# Patient Record
Sex: Female | Born: 1950 | Race: White | Hispanic: No | Marital: Married | Smoking: Never smoker
Health system: Southern US, Community
[De-identification: ages and names within clinical notes are randomized; demographics above are authoritative.]

## PROBLEM LIST (undated history)

## (undated) DIAGNOSIS — E785 Hyperlipidemia, unspecified: Secondary | ICD-10-CM

## (undated) DIAGNOSIS — E039 Hypothyroidism, unspecified: Secondary | ICD-10-CM

## (undated) DIAGNOSIS — M1990 Unspecified osteoarthritis, unspecified site: Secondary | ICD-10-CM

## (undated) DIAGNOSIS — Z87442 Personal history of urinary calculi: Secondary | ICD-10-CM

## (undated) DIAGNOSIS — J189 Pneumonia, unspecified organism: Secondary | ICD-10-CM

## (undated) DIAGNOSIS — Z974 Presence of external hearing-aid: Secondary | ICD-10-CM

## (undated) DIAGNOSIS — N200 Calculus of kidney: Secondary | ICD-10-CM

## (undated) DIAGNOSIS — Z8619 Personal history of other infectious and parasitic diseases: Secondary | ICD-10-CM

## (undated) DIAGNOSIS — J45909 Unspecified asthma, uncomplicated: Secondary | ICD-10-CM

## (undated) HISTORY — DX: Pneumonia, unspecified organism: J18.9

## (undated) HISTORY — DX: Presence of external hearing-aid: Z97.4

## (undated) HISTORY — PX: MOUTH SURGERY: SHX715

## (undated) HISTORY — PX: LITHOTRIPSY: SUR834

## (undated) HISTORY — DX: Hypothyroidism, unspecified: E03.9

## (undated) HISTORY — PX: WISDOM TOOTH EXTRACTION: SHX21

## (undated) HISTORY — DX: Calculus of kidney: N20.0

## (undated) HISTORY — PX: CYSTOSCOPY: SUR368

## (undated) HISTORY — DX: Hyperlipidemia, unspecified: E78.5

## (undated) HISTORY — DX: Unspecified osteoarthritis, unspecified site: M19.90

## (undated) HISTORY — PX: COLONOSCOPY: SHX174

## (undated) HISTORY — PX: INJECTION KNEE: SHX2446

## (undated) HISTORY — DX: Personal history of other infectious and parasitic diseases: Z86.19

## (undated) HISTORY — PX: TONSILLECTOMY AND ADENOIDECTOMY: SUR1326

---

## 1998-05-21 ENCOUNTER — Other Ambulatory Visit: Admission: RE | Admit: 1998-05-21 | Discharge: 1998-05-21 | Payer: Self-pay | Admitting: Obstetrics and Gynecology

## 1998-12-10 ENCOUNTER — Other Ambulatory Visit: Admission: RE | Admit: 1998-12-10 | Discharge: 1998-12-10 | Payer: Self-pay | Admitting: Obstetrics and Gynecology

## 1999-02-10 ENCOUNTER — Ambulatory Visit (HOSPITAL_COMMUNITY): Admission: RE | Admit: 1999-02-10 | Discharge: 1999-02-10 | Payer: Self-pay | Admitting: Urology

## 1999-02-10 ENCOUNTER — Encounter: Payer: Self-pay | Admitting: Urology

## 1999-12-09 ENCOUNTER — Ambulatory Visit (HOSPITAL_COMMUNITY): Admission: RE | Admit: 1999-12-09 | Discharge: 1999-12-09 | Payer: Self-pay | Admitting: Gastroenterology

## 1999-12-09 ENCOUNTER — Encounter (INDEPENDENT_AMBULATORY_CARE_PROVIDER_SITE_OTHER): Payer: Self-pay | Admitting: *Deleted

## 1999-12-27 ENCOUNTER — Other Ambulatory Visit: Admission: RE | Admit: 1999-12-27 | Discharge: 1999-12-27 | Payer: Self-pay | Admitting: Obstetrics and Gynecology

## 2000-07-02 ENCOUNTER — Encounter: Payer: Self-pay | Admitting: Urology

## 2000-07-04 ENCOUNTER — Encounter: Payer: Self-pay | Admitting: Urology

## 2000-07-04 ENCOUNTER — Ambulatory Visit (HOSPITAL_COMMUNITY): Admission: RE | Admit: 2000-07-04 | Discharge: 2000-07-04 | Payer: Self-pay | Admitting: Urology

## 2000-07-19 ENCOUNTER — Ambulatory Visit (HOSPITAL_COMMUNITY): Admission: RE | Admit: 2000-07-19 | Discharge: 2000-07-19 | Payer: Self-pay | Admitting: Urology

## 2000-07-19 ENCOUNTER — Encounter: Payer: Self-pay | Admitting: Urology

## 2001-01-10 ENCOUNTER — Other Ambulatory Visit: Admission: RE | Admit: 2001-01-10 | Discharge: 2001-01-10 | Payer: Self-pay | Admitting: Obstetrics and Gynecology

## 2002-05-15 ENCOUNTER — Other Ambulatory Visit: Admission: RE | Admit: 2002-05-15 | Discharge: 2002-05-15 | Payer: Self-pay | Admitting: Obstetrics and Gynecology

## 2003-06-22 ENCOUNTER — Other Ambulatory Visit: Admission: RE | Admit: 2003-06-22 | Discharge: 2003-06-22 | Payer: Self-pay | Admitting: Obstetrics and Gynecology

## 2004-06-22 ENCOUNTER — Other Ambulatory Visit: Admission: RE | Admit: 2004-06-22 | Discharge: 2004-06-22 | Payer: Self-pay | Admitting: Obstetrics and Gynecology

## 2005-01-13 ENCOUNTER — Encounter (INDEPENDENT_AMBULATORY_CARE_PROVIDER_SITE_OTHER): Payer: Self-pay | Admitting: *Deleted

## 2005-06-05 ENCOUNTER — Ambulatory Visit: Payer: Self-pay | Admitting: Internal Medicine

## 2005-06-06 ENCOUNTER — Encounter: Admission: RE | Admit: 2005-06-06 | Discharge: 2005-06-06 | Payer: Self-pay | Admitting: Internal Medicine

## 2005-06-06 ENCOUNTER — Ambulatory Visit: Payer: Self-pay | Admitting: Internal Medicine

## 2005-06-13 ENCOUNTER — Encounter: Admission: RE | Admit: 2005-06-13 | Discharge: 2005-06-13 | Payer: Self-pay | Admitting: Internal Medicine

## 2005-06-27 ENCOUNTER — Other Ambulatory Visit: Admission: RE | Admit: 2005-06-27 | Discharge: 2005-06-27 | Payer: Self-pay | Admitting: Obstetrics and Gynecology

## 2005-11-13 DIAGNOSIS — Z8619 Personal history of other infectious and parasitic diseases: Secondary | ICD-10-CM

## 2005-11-13 HISTORY — DX: Personal history of other infectious and parasitic diseases: Z86.19

## 2006-06-28 ENCOUNTER — Other Ambulatory Visit: Admission: RE | Admit: 2006-06-28 | Discharge: 2006-06-28 | Payer: Self-pay | Admitting: Obstetrics and Gynecology

## 2007-07-09 ENCOUNTER — Other Ambulatory Visit: Admission: RE | Admit: 2007-07-09 | Discharge: 2007-07-09 | Payer: Self-pay | Admitting: Obstetrics and Gynecology

## 2008-07-09 ENCOUNTER — Other Ambulatory Visit: Admission: RE | Admit: 2008-07-09 | Discharge: 2008-07-09 | Payer: Self-pay | Admitting: Obstetrics and Gynecology

## 2008-08-03 ENCOUNTER — Ambulatory Visit: Payer: Self-pay | Admitting: Obstetrics and Gynecology

## 2009-04-29 ENCOUNTER — Encounter: Admission: RE | Admit: 2009-04-29 | Discharge: 2009-04-29 | Payer: Self-pay | Admitting: Internal Medicine

## 2009-08-04 ENCOUNTER — Encounter: Payer: Self-pay | Admitting: Obstetrics and Gynecology

## 2009-08-04 ENCOUNTER — Ambulatory Visit: Payer: Self-pay | Admitting: Obstetrics and Gynecology

## 2009-08-04 ENCOUNTER — Other Ambulatory Visit: Admission: RE | Admit: 2009-08-04 | Discharge: 2009-08-04 | Payer: Self-pay | Admitting: Obstetrics and Gynecology

## 2009-08-20 ENCOUNTER — Ambulatory Visit: Payer: Self-pay | Admitting: Obstetrics and Gynecology

## 2010-07-25 ENCOUNTER — Encounter: Admission: RE | Admit: 2010-07-25 | Discharge: 2010-07-25 | Payer: Self-pay | Admitting: Internal Medicine

## 2010-07-27 ENCOUNTER — Encounter (INDEPENDENT_AMBULATORY_CARE_PROVIDER_SITE_OTHER): Payer: Self-pay | Admitting: *Deleted

## 2010-08-03 ENCOUNTER — Telehealth: Payer: Self-pay | Admitting: Internal Medicine

## 2010-08-10 ENCOUNTER — Other Ambulatory Visit: Admission: RE | Admit: 2010-08-10 | Discharge: 2010-08-10 | Payer: Self-pay | Admitting: Obstetrics and Gynecology

## 2010-08-10 ENCOUNTER — Ambulatory Visit: Payer: Self-pay | Admitting: Obstetrics and Gynecology

## 2010-08-18 ENCOUNTER — Encounter (INDEPENDENT_AMBULATORY_CARE_PROVIDER_SITE_OTHER): Payer: Self-pay | Admitting: *Deleted

## 2010-08-22 ENCOUNTER — Ambulatory Visit: Payer: Self-pay | Admitting: Internal Medicine

## 2010-08-29 ENCOUNTER — Telehealth: Payer: Self-pay | Admitting: Internal Medicine

## 2010-09-05 ENCOUNTER — Ambulatory Visit: Payer: Self-pay | Admitting: Internal Medicine

## 2010-11-13 HISTORY — PX: FOOT SURGERY: SHX648

## 2010-12-04 ENCOUNTER — Encounter: Payer: Self-pay | Admitting: Internal Medicine

## 2010-12-15 NOTE — Progress Notes (Signed)
Summary: Received Recall letter for upcoming procedure  Phone Note Call from Patient Call back at Home Phone [phone]   Call For: Dr Juanda Chance Summary of Call: Says she had a colon before and received our lovely new Recall Letter where it said to call & speak to you!  Initial call taken by: Leanor Kail [facility],  August 03, 2010 10:50 AM  Follow-up for Phone Call        Patient states she has had several colonoscopies with Dr Kinnie Scales in the past. I have sent her a release of information to sign and mail back so I can obtain records. Follow-up by: Lamona Curl CMA Duncan Dull),  August 03, 2010 11:31 AM

## 2010-12-15 NOTE — Procedures (Signed)
Summary: Colonoscopy  Patient: Kristen [name] Note: All result statuses are Final unless otherwise noted.  Tests: (1) Colonoscopy (COL)   COL Colonoscopy           DONE     [facility]     [address]     [place], [place]  [address]           COLONOSCOPY PROCEDURE REPORT           PATIENT:  Kristen [name], Kristen [name]  MR#:  [id number]     BIRTHDATE:  [birth year], 59 yrs. old  GENDER:  female     ENDOSCOPIST:  Hedwig Morton. Juanda Chance, MD     REF. BY:  Mia Creek, M.D.     PROCEDURE DATE:  09/05/2010     PROCEDURE:  Colonoscopy 43[address]     ASA CLASS:  Class I     INDICATIONS:  Elevated Risk Screening father and PGF with colon     cancer, colonoscopy every 5 years, this is a third exam, prior     colonoscopies were negative for polyps ( Dr Kinnie Scales)     MEDICATIONS:   Versed 10 mg, Fentanyl 100 mcg           DESCRIPTION OF PROCEDURE:   After the risks benefits and     alternatives of the procedure were thoroughly explained, informed     consent was obtained.  Digital rectal exam was performed and     revealed no rectal masses.   The LB CF-[id number] endoscope     was introduced through the anus and advanced to the cecum, which     was identified by both the appendix and ileocecal valve, without     limitations.  The quality of the prep was excellent, using     MiraLax.  The instrument was then slowly withdrawn as the colon     was fully examined.     <<PROCEDUREIMAGES>>           FINDINGS:  Mild diverticulosis was found (see image4 and image5).     several shallow diverticuli in the sigmoid colon, large     hypertrophied folds in the left colon,  This was otherwise a     normal examination of the colon (see image6, image3, image2, and     image1).   Retroflexed views in the rectum revealed no     abnormalities.    The scope was then withdrawn from the patient     and the procedure completed.           COMPLICATIONS:  None     ENDOSCOPIC IMPRESSION:     1) Mild  diverticulosis     2) Otherwise normal examination     RECOMMENDATIONS:     1) high fiber diet     REPEAT EXAM:  In 5 year(s) for.           ______________________________     Hedwig Morton. Juanda Chance, MD           CC:           n.     eSIGNED:   Hedwig Morton. Brodie at 09/05/2010 11:52 AM           Candie Mile, [id number]  Note: An exclamation mark (!) indicates a result that was not dispersed into the flowsheet. Document Creation Date: 09/05/2010 11:53 AM _______________________________________________________________________  (1) Order result status: Final Collection or observation date-time: 09/05/2010 11:45 Requested date-time:  Receipt date-time:  Reported date-time:  Referring Physician:   Ordering Physician: Lina Sar [phone]) Specimen Source:  Source: Launa Grill Order Number: [id number] Lab site:   Appended Document: Colonoscopy    Clinical Lists Changes  Observations: Added new observation of COLONNXTDUE: 08/2015 (09/05/2010 13:43)

## 2010-12-15 NOTE — Progress Notes (Signed)
Summary: prep ?'s  Phone Note Call from Patient Call back at Home Phone [phone] Call back at [phone]  (cell)   Caller: Patient Call For: Dr. Juanda Chance Reason for Call: Talk to Nurse Summary of Call: prep ?'s Initial call taken by: Vallarie Mare,  August 29, 2010 1:57 PM  Follow-up for Phone Call        wanted to know if she could mix the prep (Miralax in Gatorade) early (i.e. 1-2 days early), leave in frig then drink...wanted it to be cold and ready for her since she is leaving then returning to prep.  RN ok'd to mix early Follow-up by: Doristine Church RN II,  August 29, 2010 3:14 PM

## 2010-12-15 NOTE — Letter (Signed)
Summary: Colonoscopy-Dr Medoff  Colonoscopy-Dr Medoff   Imported By: Lamona Curl CMA (AAMA) 08/24/2010 16:34:44  _____________________________________________________________________  External Attachment:    Type:   Image     Comment:   External Document

## 2010-12-15 NOTE — Letter (Signed)
Summary: COLON-Dr Medoff  COLON-Dr Medoff   Imported By: Lamona Curl CMA (AAMA) 08/24/2010 16:35:14  _____________________________________________________________________  External Attachment:    Type:   Image     Comment:   External Document

## 2010-12-15 NOTE — Miscellaneous (Signed)
Summary: [facility]  Clinical Lists Changes  Medications: Added new medication of MIRALAX   POWD (POLYETHYLENE GLYCOL 3350) As per prep  instructions. - Signed Added new medication of DULCOLAX 5 MG  TBEC (BISACODYL) Day before procedure take 2 at 3pm and 2 at 8pm. - Signed Added new medication of REGLAN 10 MG  TABS (METOCLOPRAMIDE HCL) As per prep instructions. - Signed Rx of MIRALAX   POWD (POLYETHYLENE GLYCOL 3350) As per prep  instructions.;  #255gm x 0;  Signed;  Entered by: Ezra Sites RN;  Authorized by: Hart Carwin MD;  Method used: Electronically to [facility] [address] [place], [place]  [id number], Ph: [phone] or [phone], Fax: [phone] Rx of DULCOLAX 5 MG  TBEC (BISACODYL) Day before procedure take 2 at 3pm and 2 at 8pm.;  #4 x 0;  Signed;  Entered by: Ezra Sites RN;  Authorized by: Hart Carwin MD;  Method used: Electronically to [facility] [address], [place], [place]  [id number], Ph: [phone] or [phone], Fax: [phone] Rx of REGLAN 10 MG  TABS (METOCLOPRAMIDE HCL) As per prep instructions.;  #2 x 0;  Signed;  Entered by: Ezra Sites RN;  Authorized by: Hart Carwin MD;  Method used: Electronically to [facility] [address] [place], [place]  [id number], Ph: [phone] or [phone], Fax: [phone] Allergies: Added new allergy or adverse reaction of PCN Added new allergy or adverse reaction of * GANTRISIN Observations: Added new observation of NKA: F (08/22/2010 14:29)    Prescriptions: REGLAN 10 MG  TABS (METOCLOPRAMIDE HCL) As per prep instructions.  #2 x 0   Entered by:   Ezra Sites RN   Authorized by:   Hart Carwin MD   Signed by:   Ezra Sites RN on 08/22/2010   Method used:   Electronically to        [facility] (retail)       [address]       [place], [place]  [id number]       Ph: [phone] or [phone]       Fax: [phone]   RxID:   [id number] DULCOLAX 5 MG  TBEC  (BISACODYL) Day before procedure take 2 at 3pm and 2 at 8pm.  #4 x 0   Entered by:   Ezra Sites RN   Authorized by:   Hart Carwin MD   Signed by:   Ezra Sites RN on 08/22/2010   Method used:   Electronically to        [facility] (retail)       [address]       [place], [place]  [id number]       Ph: [phone] or [phone]       Fax: [phone]   RxID:   [id number] MIRALAX   POWD (POLYETHYLENE GLYCOL 3350) As per prep  instructions.  #255gm x 0   Entered by:   Ezra Sites RN   Authorized by:   Hart Carwin MD   Signed by:   Ezra Sites RN on 08/22/2010   Method used:   Electronically to        [facility] (retail)       [address]       [place], [place]  [id number]       Ph: [phone] or [phone]       Fax: [phone]   RxID:   [id number]

## 2010-12-15 NOTE — Letter (Signed)
Summary: Pre Visit Letter Revised  [facility]  [address] [place], [place] [address]   Phone: [phone]  Fax: [phone]        07/27/2010 MRN: [id number]   Kristen [name] [address] [place], [place]  [address]          Welcome to the Gastroenterology Division at [facility]    You are scheduled to see a nurse for your pre-procedure visit on August 22, 2010 at 2:30pm on the 3rd floor at [facility], [address]  We ask that you try to arrive at our office 15 minutes prior to your appointment time to allow for check-in.  Please take a minute to review the attached form.  If you answer "Yes" to one or more of the questions on the first page, we ask that you call the person listed at your earliest opportunity.  If you answer "No" to all of the questions, please complete the rest of the form and bring it to your appointment.    Your nurse visit will consist of discussing your medical and surgical history, your immediate family medical history, and your medications.   If you are unable to list all of your medications on the form, please bring the medication bottles to your appointment and we will list them.  We will need to be aware of both prescribed and over the counter drugs.  We will need to know exact dosage information as well.    Please be prepared to read and sign documents such as consent forms, a financial agreement, and acknowledgement forms.  If necessary, and with your consent, a friend or relative is welcome to sit-in on the nurse visit with you.  Please bring your insurance card so that we may make a copy of it.  If your insurance requires a referral to see a specialist, please bring your referral form from your primary care physician.  No co-pay is required for this nurse visit.     If you cannot keep your appointment, please call [phone] to cancel or reschedule prior to your appointment date.  This allows [place] the  opportunity to schedule an appointment for another patient in need of care.    Thank you for choosing [facility] for your medical needs.  We appreciate the opportunity to care for you.  Please visit [place] at our website  to learn more about our practice.  Sincerely, The Gastroenterology Division

## 2010-12-15 NOTE — Letter (Signed)
Summary: Miralax Instructions  [facility]  [address]   [place], [place] [address]   Phone: [phone]  Fax: [phone]       Kristen [name]    1951-03-27    MRN: [id number]       Procedure Day Dorna Bloom: Monday, 09-05-10     Arrival Time: 10:30 a.m.     Procedure Time: 11:30 a.m.     Location of Procedure:                    x   [facility] (4th Floor)   PREPARATION FOR COLONOSCOPY WITH MIRALAX  Starting 5 days prior to your procedure 08-31-10 do not eat nuts, seeds, popcorn, corn, beans, peas,  salads, or any raw vegetables.  Do not take any fiber supplements (e.g. Metamucil, Citrucel, and Benefiber). ____________________________________________________________________________________________________   THE DAY BEFORE YOUR PROCEDURE         DATE: 09-04-10  DAY: Sunday  1   Drink clear liquids the entire day-NO SOLID FOOD  2   Do not drink anything colored red or purple.  Avoid juices with pulp.  No orange juice.  3   Drink at least 64 oz. (8 glasses) of fluid/clear liquids during the day to prevent dehydration and help the prep work efficiently.  CLEAR LIQUIDS INCLUDE: Water Jello Ice Popsicles Tea (sugar ok, no milk/cream) Powdered fruit flavored drinks Coffee (sugar ok, no milk/cream) Gatorade Juice: apple, white grape, white cranberry  Lemonade Clear bullion, consomm, broth Carbonated beverages (any kind) Strained chicken noodle soup Hard Candy  4   Mix the entire bottle of Miralax with 64 oz. of Gatorade/Powerade in the morning and put in the refrigerator to chill.  5   At 3:00 pm take 2 Dulcolax/Bisacodyl tablets.  6   At 4:30 pm take one Reglan/Metoclopramide tablet.  7  Starting at 5:00 pm drink one 8 oz glass of the Miralax mixture every 15-20 minutes until you have finished drinking the entire 64 oz.  You should finish drinking prep around 7:30 or 8:00 pm.  8   If you are nauseated, you may take the 2nd  Reglan/Metoclopramide tablet at 6:30 pm.        9    At 8:00 pm take 2 more DULCOLAX/Bisacodyl tablets.     THE DAY OF YOUR PROCEDURE      DATE:  09-05-10  DAY: Monday  You may drink clear liquids until  9:30 a.m.   (2 HOURS BEFORE PROCEDURE).   MEDICATION INSTRUCTIONS  Unless otherwise instructed, you should take regular prescription medications with a small sip of water as early as possible the morning of your procedure.           OTHER INSTRUCTIONS  You will need a responsible adult at least 59 years of age to accompany you and drive you home.   This person must remain in the waiting room during your procedure.  Wear loose fitting clothing that is easily removed.  Leave jewelry and other valuables at home.  However, you may wish to bring a book to read or an iPod/MP3 player to listen to music as you wait for your procedure to start.  Remove all body piercing jewelry and leave at home.  Total time from sign-in until discharge is approximately 2-3 hours.  You should go home directly after your procedure and rest.  You can resume normal activities the day after your procedure.  The day of your procedure you should not:  Drive   Make legal decisions   Operate machinery   Drink alcohol   Return to work  You will receive specific instructions about eating, activities and medications before you leave.   The above instructions have been reviewed and explained to me by  Ezra Sites RN  August 22, 2010 3:10 PM      I fully understand and can verbalize these instructions _____________________________ Date _______

## 2011-03-31 NOTE — Op Note (Signed)
[facility]  Patient:    Kristen [name], Kristen [name]                     MRN: [id number] Proc. Date: 07/04/00 Adm. Date:  [id number] Attending:  Katherine Roan                           Operative Report  PREOPERATIVE DIAGNOSES:  Left distal ureteral stones with obstruction.  POSTOPERATIVE DIAGNOSES:  Left distal ureteral stones with obstruction.  OPERATIONS:  Cystoureteroscopy and insertion of ureteral stent.  ANESTHESIA:  General.  SURGEON:  Rozanna Boer., M.D.  BRIEF HISTORY:  This 60 year old white female has had trouble for the last three to four weeks with pain and intermittent left flank pain and hematuria. She was found to have two stones in the distal left ureter about 6-8 cm from the UP junction.  The largest stone was about 6 mm in greatest diameter and a smaller one behind it was about 3 mm.  She had had a previous right lithotripsy March 2000.  She enters now for ureteroscopy, possible holmium laser treatment for these obstructing stones that have been present probably for at least a month.  DESCRIPTION OF PROCEDURE:  The patient was placed on the operating table in the dorsal lithotomy position and prepped and draped with Betadine in the usual sterile fashion.  She had been given IV antibiotics.  A #21 panendoscope was inserted.  The bladder was inspected and found to be free of any mucosal lesions.  The left orifice was catheterized with open-ended 6 ureteral catheter and an occlusive retrograde demonstrated the stones present in the distal ureter, as described.  There was a moderate amount of hydronephrosis proximal to the stones.  I tried to pass a .038 floppy-tipped guidewire pass the stones, but it was too tight.  I had to use a glidewire to get past the stones and then advance the open-ended catheter pass the stones but with some resistance.  Through the open-ended catheter, I then inserted the guidewire and removed  the open-ended catheter.  Then I inserted a 10 cm ureteral balloon dilator, but it did not seem to advance up to the stone but was inflated to 12 atmospheres.  I did not feel good about this being right up to the stones, so I removed this and then inserted a 4 cm ureteral balloon, and this went up to the stones but would not hold the fluid and seemed to have a leak.  I then thought the orifice was perhaps dilated enough to pass a 6 short ureteroscope, which I did up to the stones, but there was something the matter with the lens, and half the lens was cloudy, and I could not see well enough to do the procedure, so I took that out and then used a 9 short ureteroscope.  I could get up to the stone, but there was a little bit of bleeding, and again, the scope seemed to be a little large for the amount of dilatation that I had done up to that point.  At this point, there was a little bit of urinary extravasation from the kidney up above, and I felt it would be the better part of valor to just insert a stent and to come back at a later time.  Since these stones were so imbedded in the ureteral wall, I need good visualization to be able to get them  out.  So a 6 French x 24 cm ureteral stent was fashioned over the guidewire with fluoroscopy up to the renal pelvis, and the guidewire was removed, leaving the stent in good position.  The bladder was drained.  She was given 30 mg of Toradol IV and was taken to recovery room in good condition.  PLAN:  Repeat the ureteroscopy in about ten days and allow the stent to dilate the ureter a little more. DD:  07/04/00 TD:  07/04/00 Job: [id number]

## 2011-03-31 NOTE — Op Note (Signed)
[facility]  Patient:    Kristen [name], Kristen [name]                     MRN: [id number] Proc. Date: 07/19/00 Adm. Date:  [id number] Attending:  Katherine Roan                           Operative Report  PREOPERATIVE DIAGNOSES:  Left ureteral calculi.  POSTOPERATIVE DIAGNOSES:  Left ureteral calculi.  OPERATION PERFORMED:  Ureteroscopy, stone extraction, insertion of left ureteral stent.  ANESTHESIA:  General.  SURGEON:  Dr. Aldean Ast.  BRIEF HISTORY:  This 60 year old white female presented with pain for approximately a month in August of the left side. She was found to have fairly large stones in the distal left ureter, the largest of which was 6 x 5 mm. She had attempted ureteroscopy on July 04, 2000 but because of malfunction of some equipment, I just inserted a stent at that time. She comes in now for removal of the stones.  DESCRIPTION OF PROCEDURE:  She was placed on th operating table in dorsal lithotomy position and after satisfactory induction of general endotracheal anesthesia was prepped and draped with Betadine in the usual sterile fashion and given IV Cipro. A #21 panendoscope was inserted and the bladder inspected. The bladder was smooth. There was a little blood clot coming from the left ureteral orifice where the stent was seen emerging. This was grasped with forceps and removed outside the urethral meatus. Using fluoroscopy, I passed a guidewire through the open end of the stent up to the level of the kidney. As the stent was removed, the guidewire was left in place. Over the guidewire, I dilated the distal ureter with a 10 cm balloon to 10 atmospheres for 5 timed minutes. When this was done, the balloon was then removed and I passed a 6 French ureteroscope into the distal ureter. There were 2 stones, 1 more distal and 1 more proximal just below the iliac vessels. The larger one was near the iliac so I grasped that with a  basket and removed this intact. I removed the second one again under direct vision. A third pass showed a tiny fragment up in the ureter about 10-12 cm from the UV junction and this also was removed. A fourth pass revealed no evidence of any recurrent stones. The mucosa looked adequate but slightly hyperemic. Over the guidewire I then passed under fluoroscopy, a 6 French x 26 cm length double J ureteral stent with a string attached to the distal end. The guidewire was then removed. The stent was in good position, the bladder drained. The string was then brought out through the urethral meatus and tapes to her lower abdomen. She was then taken to the recovery room in good condition to be later discharged as an outpatient with detailed written instructions. DD:  07/19/00 TD:  07/20/00 Job: [id number]

## 2011-06-21 ENCOUNTER — Encounter: Payer: Self-pay | Admitting: Internal Medicine

## 2011-06-21 ENCOUNTER — Ambulatory Visit (INDEPENDENT_AMBULATORY_CARE_PROVIDER_SITE_OTHER): Payer: Managed Care, Other (non HMO) | Admitting: Internal Medicine

## 2011-06-21 VITALS — BP 110/70 | HR 67 | Temp 98.9°F | Resp 12 | Ht 64.75 in | Wt 131.2 lb

## 2011-06-21 DIAGNOSIS — Z0000 Encounter for general adult medical examination without abnormal findings: Secondary | ICD-10-CM

## 2011-06-21 DIAGNOSIS — E785 Hyperlipidemia, unspecified: Secondary | ICD-10-CM

## 2011-06-21 LAB — CBC WITH DIFFERENTIAL/PLATELET
Basophils Absolute: 0 10*3/uL (ref 0.0–0.1)
Hemoglobin: 14.6 g/dL (ref 12.0–15.0)
Lymphocytes Relative: 23.4 % (ref 12.0–46.0)
Monocytes Relative: 10.2 % (ref 3.0–12.0)
Neutro Abs: 3.9 10*3/uL (ref 1.4–7.7)
Neutrophils Relative %: 64.4 % (ref 43.0–77.0)
Platelets: 154 10*3/uL (ref 150.0–400.0)
RDW: 12.1 % (ref 11.5–14.6)

## 2011-06-21 LAB — LIPID PANEL
Cholesterol: 179 mg/dL (ref 0–200)
HDL: 65.4 mg/dL (ref >39.00)
LDL Cholesterol: 98 mg/dL (ref 0–99)
Total CHOL/HDL Ratio: 3
Triglycerides: 76 mg/dL (ref 0.0–149.0)
VLDL: 15.2 mg/dL (ref 0.0–40.0)

## 2011-06-21 LAB — HEPATIC FUNCTION PANEL
ALT: 18 U/L (ref 0–35)
AST: 22 U/L (ref 0–37)
Albumin: 4.2 g/dL (ref 3.5–5.2)

## 2011-06-21 LAB — BASIC METABOLIC PANEL WITH GFR
BUN: 16 mg/dL (ref 6–23)
CO2: 28 meq/L (ref 19–32)
Calcium: 9.1 mg/dL (ref 8.4–10.5)
Chloride: 105 meq/L (ref 96–112)
Creatinine, Ser: 0.6 mg/dL (ref 0.4–1.2)
GFR: 106.59 mL/min (ref >60.00)
Glucose, Bld: 81 mg/dL (ref 70–99)
Potassium: 3.5 meq/L (ref 3.5–5.1)
Sodium: 141 meq/L (ref 135–145)

## 2011-06-21 NOTE — Progress Notes (Signed)
Subjective:    Patient ID: Kristen [name], female    DOB: 07/27/1951, 60 y.o.   MRN: [id number]  HPI  Kristen [name]  is here for a physical;acute issues include stress due to family health issues.      Review of Systems Patient reports no vision changes, adenopathy,fever,   persistant / recurrent hoarseness , swallowing issues, chest pain,palpitations,edema,persistant /recurrent cough, hemoptysis, dyspnea( rest/ exertional/paroxysmal nocturnal), gastrointestinal bleeding(melena, rectal bleeding), abdominal pain, significant heartburn,  bowel changes,GU symptoms(dysuria, hematuria,pyuria, incontinence), Gyn symptoms(abnormal  bleeding , pain),  syncope, focal weakness, memory loss,numbness & tingling, skin/hair /nail changes,abnormal bruising or bleeding, or depression.   She's had a 20 pound weight loss from October, 2011 to February of this year with lifestyle changes. She walks 2-3 miles per day. Because of a bone spur & cyst  on the right foot; she may require orthopedic surgery by Dr. Lestine [name].     Objective:   Physical Exam Gen.: Healthy and well-nourished in appearance. Alert, appropriate and cooperative throughout exam. Head: Normocephalic without obvious abnormalities Eyes: No corneal or conjunctival inflammation noted. Pupils equal round reactive to light and accommodation. Fundal exam is benign without hemorrhages, exudate, papilledema. Extraocular motion intact. Vision grossly normal with lenses. Ears: External  ear exam reveals no significant lesions or deformities. Canals ; minimal wax.Hearing  aids bilaterally. Nose: External nasal exam reveals no deformity or inflammation. Nasal mucosa are pink and moist. No lesions or exudates noted. Septum w/o deviation Mouth: Oral mucosa and oropharynx reveal no lesions or exudates. Teeth in good repair. Neck: No deformities, masses, or tenderness noted. Range of motion &. Thyroid :physiologic asymmetry. Lungs: Normal respiratory effort; chest  expands symmetrically. Lungs are clear to auscultation without rales, wheezes, or increased work of breathing. Heart: Normal rate and rhythm. Normal S1 and S2. No gallop, click, or rub. Faint LSB  Grade 1/2 systolic  murmur. Abdomen: Bowel sounds normal; abdomen soft and nontender. No masses, organomegaly or hernias noted. Palpable aorta with aortic bruit w/o AAA Genitalia: Dr Kristen [name]   .                                                                                   Musculoskeletal/extremities: No deformity or scoliosis noted of  the thoracic or lumbar spine. No clubbing, cyanosis, edema noted. Range of motion  normal .Tone & strength  normal.Joints normal. Nail health  Good. Spur L  Base of R great toe Vascular: Carotid, radial artery, dorsalis pedis and  posterior tibial pulses are full and equal.  Neurologic: Alert and oriented x3. Deep tendon reflexes symmetrical and normal.         Skin: Intact without suspicious lesions or rashes. Lymph: No cervical, axillary  lymphadenopathy present. Psych: Mood and affect are normal. Normally interactive  Assessment & Plan:  #1 comprehensive physical exam; no acute findings #2 see Problem List with Assessments & Recommendations Plan: see Orders

## 2011-06-21 NOTE — Patient Instructions (Signed)
Preventive Health Care: Exercise  30-45  minutes a day, 3-4 days a week. Walking is especially valuable in preventing Osteoporosis. Eat a low-fat diet with lots of fruits and vegetables, up to 7-9 servings per day.

## 2011-07-04 ENCOUNTER — Other Ambulatory Visit: Payer: Self-pay | Admitting: Internal Medicine

## 2011-07-04 NOTE — Telephone Encounter (Signed)
Was supposed to decrease medication but not sure how to take since medication is compounded in capsule form. Pt provided # of pharmacy and will give them a call in the am [phone] Dorene Sorrow.

## 2011-07-05 MED ORDER — AMBULATORY NON FORMULARY MEDICATION: Status: DC

## 2011-07-05 NOTE — Telephone Encounter (Signed)
Addended by: Doristine Devoid on: 07/05/2011 11:43 AM   Modules accepted: Orders

## 2011-07-05 NOTE — Telephone Encounter (Addendum)
Called pharmacy to see how pt is to take 1/2 dose and was given instructions for T3/T4-12.48mcg/25mcg that would be equal to half of what she was taken pt informed refill called to pharmacy

## 2011-07-20 ENCOUNTER — Encounter: Payer: Self-pay | Admitting: Obstetrics and Gynecology

## 2011-08-02 ENCOUNTER — Other Ambulatory Visit: Payer: Self-pay | Admitting: Internal Medicine

## 2011-08-02 MED ORDER — AMBULATORY NON FORMULARY MEDICATION: Status: DC

## 2011-08-02 NOTE — Telephone Encounter (Signed)
RX sent

## 2011-08-07 ENCOUNTER — Other Ambulatory Visit: Payer: Self-pay | Admitting: Internal Medicine

## 2011-08-07 NOTE — Telephone Encounter (Signed)
RX called in .

## 2011-08-07 NOTE — Telephone Encounter (Signed)
Refill for t/3 & t/4 was sent to pharmacy [phone] - patient said they didn't receive iit --- pill time - patient gave phone # [phone]

## 2011-08-10 DIAGNOSIS — N200 Calculus of kidney: Secondary | ICD-10-CM | POA: Insufficient documentation

## 2011-08-22 ENCOUNTER — Ambulatory Visit (INDEPENDENT_AMBULATORY_CARE_PROVIDER_SITE_OTHER): Payer: Managed Care, Other (non HMO) | Admitting: Obstetrics and Gynecology

## 2011-08-22 ENCOUNTER — Other Ambulatory Visit (HOSPITAL_COMMUNITY)
Admission: RE | Admit: 2011-08-22 | Discharge: 2011-08-22 | Disposition: A | Discharge status: Home / Self Care | Payer: Managed Care, Other (non HMO) | Source: Ambulatory Visit | Attending: Obstetrics and Gynecology | Admitting: Obstetrics and Gynecology

## 2011-08-22 ENCOUNTER — Encounter: Payer: Self-pay | Admitting: Obstetrics and Gynecology

## 2011-08-22 VITALS — BP 116/74 | Ht 64.0 in | Wt 136.0 lb

## 2011-08-22 DIAGNOSIS — Z780 Asymptomatic menopausal state: Secondary | ICD-10-CM

## 2011-08-22 DIAGNOSIS — Z23 Encounter for immunization: Secondary | ICD-10-CM

## 2011-08-22 DIAGNOSIS — N951 Menopausal and female climacteric states: Secondary | ICD-10-CM

## 2011-08-22 DIAGNOSIS — Z01419 Encounter for gynecological examination (general) (routine) without abnormal findings: Secondary | ICD-10-CM | POA: Insufficient documentation

## 2011-08-22 NOTE — Progress Notes (Signed)
Patient came to see me today for her annual GYN exam. She is up-to-date on mammograms and bone densities. She takes bioidentical hormones and likes it better than synthetics. She is having no bleeding. She wanted a flu shot today. She takes fiorinal for headaches with good results.   Physical examination: HEENT within normal limits. Neck: Thyroid not large. No masses. Supraclavicular nodes: not enlarged. Breasts: Examined in both sitting midline position. No skin changes and no masses. Abdomen: Soft no guarding rebound or masses or hernia. Pelvic: External: Within normal limits. BUS: Within normal limits. Vaginal:within normal limits. Good estrogen effect. No evidence of cystocele rectocele or enterocele. Cervix: clean. Uterus: Normal size and shape. Adnexa: No masses. Rectovaginal exam: Confirmatory and negative. Extremities: Within normal limits.  Assessment: Menopausal symptoms. Headaches of chronic duration. Plan: Discussed increased risk of breast cancer with HRT. She feels benefits outweigh the risks. Continue yearly mammograms. Continue HRT. Flu shot given. Lab through PCP.

## 2011-08-28 ENCOUNTER — Other Ambulatory Visit: Payer: Self-pay | Admitting: Internal Medicine

## 2011-08-28 DIAGNOSIS — E039 Hypothyroidism, unspecified: Secondary | ICD-10-CM

## 2011-08-29 ENCOUNTER — Other Ambulatory Visit (INDEPENDENT_AMBULATORY_CARE_PROVIDER_SITE_OTHER): Payer: Managed Care, Other (non HMO)

## 2011-08-29 DIAGNOSIS — E039 Hypothyroidism, unspecified: Secondary | ICD-10-CM

## 2011-08-29 LAB — TSH: TSH: 1.81 u[IU]/mL (ref 0.35–5.50)

## 2011-09-04 ENCOUNTER — Other Ambulatory Visit: Payer: Self-pay | Admitting: *Deleted

## 2011-09-04 MED ORDER — AMBULATORY NON FORMULARY MEDICATION: Status: DC

## 2011-09-04 MED ORDER — NONFORMULARY OR COMPOUNDED ITEM: Status: DC

## 2011-09-04 NOTE — Telephone Encounter (Signed)
Pt sent in the Rx request by mail, saw Dr Reece Agar for her AEX earlier this month. Dr Reece Agar asked me to call them in as they were presented by the pt by the pharmacy. I copied the Rxs into the system word for word. And called them in.

## 2011-09-06 ENCOUNTER — Ambulatory Visit (HOSPITAL_BASED_OUTPATIENT_CLINIC_OR_DEPARTMENT_OTHER)
Admission: RE | Admit: 2011-09-06 | Discharge: 2011-09-06 | Disposition: A | Discharge status: Home / Self Care | Payer: Managed Care, Other (non HMO) | Source: Ambulatory Visit | Attending: Orthopedic Surgery | Admitting: Orthopedic Surgery

## 2011-09-06 ENCOUNTER — Other Ambulatory Visit: Payer: Self-pay | Admitting: Orthopedic Surgery

## 2011-09-06 DIAGNOSIS — M259 Joint disorder, unspecified: Secondary | ICD-10-CM | POA: Insufficient documentation

## 2011-09-06 DIAGNOSIS — M2020 Hallux rigidus, unspecified foot: Secondary | ICD-10-CM | POA: Insufficient documentation

## 2011-10-02 ENCOUNTER — Other Ambulatory Visit: Payer: Managed Care, Other (non HMO)

## 2011-10-18 ENCOUNTER — Other Ambulatory Visit: Payer: Self-pay | Admitting: Internal Medicine

## 2011-10-18 MED ORDER — AMBULATORY NON FORMULARY MEDICATION: Status: DC

## 2011-10-18 NOTE — Telephone Encounter (Signed)
RX called in .

## 2011-12-19 ENCOUNTER — Other Ambulatory Visit: Payer: Self-pay | Admitting: *Deleted

## 2011-12-19 MED ORDER — BUTALBITAL-ASA-CAFFEINE 50-325-40 MG PO CAPS: ORAL_CAPSULE | ORAL | Status: DC

## 2011-12-20 NOTE — Telephone Encounter (Signed)
rx called in

## 2012-03-26 ENCOUNTER — Emergency Department (HOSPITAL_COMMUNITY): Payer: Managed Care, Other (non HMO)

## 2012-03-26 ENCOUNTER — Encounter (HOSPITAL_COMMUNITY): Payer: Self-pay | Admitting: Emergency Medicine

## 2012-03-26 ENCOUNTER — Emergency Department (HOSPITAL_COMMUNITY)
Admission: EM | Admit: 2012-03-26 | Discharge: 2012-03-27 | Disposition: A | Discharge status: Home / Self Care | Payer: Managed Care, Other (non HMO) | Attending: Emergency Medicine | Admitting: Emergency Medicine

## 2012-03-26 DIAGNOSIS — IMO0001 Reserved for inherently not codable concepts without codable children: Secondary | ICD-10-CM | POA: Insufficient documentation

## 2012-03-26 DIAGNOSIS — R229 Localized swelling, mass and lump, unspecified: Secondary | ICD-10-CM | POA: Insufficient documentation

## 2012-03-26 DIAGNOSIS — S61209A Unspecified open wound of unspecified finger without damage to nail, initial encounter: Secondary | ICD-10-CM | POA: Insufficient documentation

## 2012-03-26 DIAGNOSIS — W5501XA Bitten by cat, initial encounter: Secondary | ICD-10-CM

## 2012-03-26 NOTE — ED Notes (Signed)
PT. BITTEN BY HER PET CAT THIS AFTERNOON , PRESENTS WITH RIGHT 4TH FINGER SWELLING . IMMUNIZATIONS COMPLETE PER PT.  NO BLEEDING .

## 2012-03-27 MED ORDER — TETANUS-DIPHTH-ACELL PERTUSSIS 5-2.5-18.5 LF-MCG/0.5 IM SUSP
0.5000 mL | Freq: Once | INTRAMUSCULAR | Status: AC
  Administered 2012-03-27: 0.5 mL via INTRAMUSCULAR
  Filled 2012-03-27: qty 0.5

## 2012-03-27 MED ORDER — DOXYCYCLINE HYCLATE 100 MG PO CAPS: 100.0000 mg | ORAL_CAPSULE | Freq: Two times a day (BID) | ORAL | Status: AC

## 2012-03-27 NOTE — ED Provider Notes (Signed)
History     CSN: [id number]  Arrival date & time 03/26/12  2246   First MD Initiated Contact with Patient 03/26/12 2359      Chief Complaint  Patient presents with  . Animal Bite    (Consider location/radiation/quality/duration/timing/severity/associated sxs/prior treatment) HPI Comments: Patient with a significant past medical history presents emergency department chief complaint of Bite.  Injury occurred this afternoon by her personal pet.  Tetanus status unknown.  Patient denies any pain, fevers, night sweats, chills, decreased range of motion, but reports ring finger swelling.  No other complaints at this time.  Patient is a 61 y.o. female presenting with animal bite. The history is provided by the patient.  Animal Bite  Pertinent negatives include no chest pain, no numbness, no visual disturbance, no abdominal pain, no headaches, no light-headedness and no weakness.    Past Medical History  Diagnosis Date  . Kidney stones, calcium oxalate     Lithrotripsy x1; surgical removal X1  via cystoscopy  . Migraine     menopausal    Past Surgical History  Procedure Date  . Lithotripsy   . Cystoscopy   . Tonsillectomy and adenoidectomy   . Colonoscopy     negative ; initially age 72    Family History  Problem Relation Age of Onset  . Heart failure Paternal Grandmother   . Dementia Mother   . COPD Mother   . Cancer Father     colon  . Diabetes Father   . Hypertension Father   . Cancer Paternal Grandfather     prostate/ colon  . Cancer      PG aunt, colon    History  Substance Use Topics  . Smoking status: Never Smoker   . Smokeless tobacco: Not on file  . Alcohol Use: 4.2 oz/week    7 Glasses of wine per week    OB History    Grav Para Term Preterm Abortions TAB SAB Ect Mult Living   2 1        1       Review of Systems  Constitutional: Negative for fever, chills and appetite change.  HENT: Negative for congestion.   Eyes: Negative for visual  disturbance.  Respiratory: Negative for shortness of breath.   Cardiovascular: Negative for chest pain and leg swelling.  Gastrointestinal: Negative for abdominal pain.  Genitourinary: Negative for dysuria, urgency and frequency.  Skin: Positive for wound.  Neurological: Negative for dizziness, syncope, weakness, light-headedness, numbness and headaches.  Psychiatric/Behavioral: Negative for confusion.    Allergies  Penicillins; Gantrisin; and Sulfa antibiotics  Home Medications   Current Outpatient Rx  Name Route Sig Dispense Refill  . AMBULATORY NON FORMULARY MEDICATION  Compounded Estradiol + Progesterone 0.15mg  + 10% cream  Apply 1gm daily D6-30 rotate sites Disp 1 tub of 78GM 78 day supply 1 Tube 8  . AMBULATORY NON FORMULARY MEDICATION  Medication Name:  T3/T4- 12.43mcg/25mcg 30 capsule 5  . VITAMIN C PO Oral Take 1 tablet by mouth daily.    Marland Kitchen BUTALBITAL-ASA-CAFFEINE 50-325-40 MG PO CAPS  Take one tablet every six hours as needed for headache 30 capsule 4  . CALCIUM CITRATE + PO Oral Take by mouth. Two times daily     . GLUCOSAMINE PO Oral Take 1 tablet by mouth 2 (two) times daily.    Marland Kitchen ONE-DAILY MULTI VITAMINS PO TABS Oral Take 1 tablet by mouth daily. MinRX dietary supplement: 1 by mouth daily     . FISH OIL 1200  MG PO CAPS Oral Take by mouth 2 (two) times daily.        BP 150/73  Pulse 61  Temp(Src) 98 F (36.7 C) (Oral)  Resp 16  SpO2 100%  Physical Exam  Nursing note and vitals reviewed. Constitutional: She is oriented to person, place, and time. She appears well-developed and well-nourished. No distress.  HENT:  Head: Normocephalic and atraumatic.  Eyes: Conjunctivae and EOM are normal.  Neck: Normal range of motion.  Pulmonary/Chest: Effort normal.  Musculoskeletal: Normal range of motion.  Neurological: She is alert and oriented to person, place, and time.  Skin: Skin is warm and dry. No rash noted. She is not diaphoretic.       Puncture wound of 4th  finger, palmer surface. Swelling confined to proximal phalange, no warmth,erythema, streaking or draining. Intact ROM & sensation.   Psychiatric: She has a normal mood and affect. Her behavior is normal.    ED Course  Procedures (including critical care time)  Labs Reviewed - No data to display Dg Finger Ring Right  03/26/2012  *RADIOLOGY REPORT*  Clinical Data: Cat bite of right ring finger.  Swelling and erythema.  RIGHT RING FINGER 2+V  Comparison: None.  Findings: Mild soft tissue swelling noted proximally in the right ring finger.  No foreign body or osseous abnormality observed.  IMPRESSION:  1.  Proximal soft tissue swelling in the finger, without fracture or foreign body identified.  Original Report Authenticated By: Dellia Cloud, M.D.     No diagnosis found.    MDM  Cat bite  Patient given tetanus booster.  Advised to followup within 24 hours to primary care physician.  Patient discharged on Augmentin 875 twice a day x14 days.  Discussed signs of infection spread and reasons to return to the emergency department.  Patient verbalizes understanding and appears to be reliable source for followup.  Wound is not currently bleeding, exam non concerning for tenosynovitis or systemic infection. VSS and NAD prior to dc.   Pt with PCN allergies, treated with doxy instead.      Jaci Carrel, PA-C 03/27/12 0016  Jaci Carrel, PA-C 03/27/12 0020

## 2012-03-27 NOTE — Discharge Instructions (Signed)
Animal Bite An animal bite can result in a scratch on the skin, deep open cut, puncture of the skin, crush injury, or tearing away of the skin or a body part. Dogs are responsible for most animal bites. Children are bitten more often than adults. An animal bite can range from very mild to more serious. A small bite from your house pet is no cause for alarm. However, some animal bites can become infected or injure a bone or other tissue. You must seek medical care if:  The skin is broken and bleeding does not slow down or stop after 15 minutes.   The puncture is deep and difficult to clean (such as a cat bite).   Pain, warmth, redness, or pus develops around the wound.   The bite is from a stray animal or rodent. There may be a risk of rabies infection.   The bite is from a snake, raccoon, skunk, fox, coyote, or bat. There may be a risk of rabies infection.   The person bitten has a chronic illness such as diabetes, liver disease, or cancer, or the person takes medicine that lowers the immune system.   There is concern about the location and severity of the bite.  It is important to clean and protect an animal bite wound right away to prevent infection. Follow these steps:  Clean the wound with plenty of water and soap.   Apply an antibiotic cream.   Apply gentle pressure over the wound with a clean towel or gauze to slow or stop bleeding.   Elevate the affected area above the heart to help stop any bleeding.   Seek medical care. Getting medical care within 8 hours of the animal bite leads to the best possible outcome.  DIAGNOSIS  Your caregiver will most likely:  Take a detailed history of the animal and the bite injury.   Perform a wound exam.   Take your medical history.  Blood tests or X-rays may be performed. Sometimes, infected bite wounds are cultured and sent to a lab to identify the infectious bacteria.  TREATMENT  Medical treatment will depend on the location and type of  animal bite as well as the patient's medical history. Treatment may include:  Wound care, such as cleaning and flushing the wound with saline solution, bandaging, and elevating the affected area.   Antibiotics.   Tetanus immunization.   Rabies immunization.   Leaving the wound open to heal. This is often done with animal bites, due to the high risk of infection. However, in certain cases, wound closure with stitches, wound adhesive, skin adhesive strips, or staples may be used.  Infected bites that are left untreated may require intravenous (IV) antibiotics and surgical treatment in the hospital. HOME CARE INSTRUCTIONS  Follow your caregiver's instructions for wound care.   Take all medicines as directed.   If your caregiver prescribes antibiotics, take them as directed. Finish them even if you start to feel better.   Follow up with your caregiver for further exams or immunizations as directed.  You may need a tetanus shot if:  You cannot remember when you had your last tetanus shot.   You have never had a tetanus shot.   The injury broke your skin.  If you get a tetanus shot, your arm may swell, get red, and feel warm to the touch. This is common and not a problem. If you need a tetanus shot and you choose not to have one, there is a   rare chance of getting tetanus. Sickness from tetanus can be serious. SEEK MEDICAL CARE IF:  You notice warmth, redness, soreness, swelling, pus discharge, or a bad smell coming from the wound.   You have a red line on the skin coming from the wound.   You have a fever, chills, or a general ill feeling.   You have nausea or vomiting.   You have continued or worsening pain.   You have trouble moving the injured part.   You have other questions or concerns.  MAKE SURE YOU:  Understand these instructions.   Will watch your condition.   Will get help right away if you are not doing well or get worse.  Document Released: 07/18/2011  Document Revised: 10/19/2011 Document Reviewed: 07/18/2011 [facility] Patient Information 2012 [place], [place].Pasteurella Multocida Infection Pasteurella multocida or P. multocida is a bacteria that can cause infection. Healthy dogs and cats carry these bacteria in their mouths. This kind of infection is usually caused by an animal bite. It can also occur after a dog or cat licks a person's skin that is damaged by a cut or scratch. When people are infected, a bad skin infection usually results. The infection can then spread into bones and tendons. Rarely, the infection can spread to your blood. If this happens, you can develop a heart infection (endocarditis). The bacteria can also cause an infection on the surface of the brain (meningitis). CAUSES  Contact with an animal is usually the cause of this infection. Cats, dogs, poultry (chicken, [place]), and livestock (cow, horse, sheep) can all carry the bacteria. The bacteria may spread to a person through biting, scratching, or licking an open sore. Sometimes, the cause is unknown. SYMPTOMS  Symptoms usually start within 24 hours after contact with an animal. Symptoms may include:  Pain, redness, warmth, and swelling around the bite.   Fluid leaking from the bite area.   Fever.   Joint pain. This can make it hard for you to move.   Bone pain.  DIAGNOSIS  To decide if you have a P. multocida infection, your caregiver will probably:  Ask about any recent contact you have had with animals.   Check for signs of infection. This could include:   Taking a sample of fluid leaking from your wound.   Taking a sample of fluid from a joint.   Doing blood tests.   Doing imaging tests. This may include CT scans or an MRI scan. These scans can show if there is an infection in your tendons, joints, or bones.  TREATMENT   For a simple skin and soft tissue infection, you may need to take antibiotic medicines for 7 to 10 days. For a worse infection,  antibiotics may need to be given for 2 to 6 weeks.   Some infections need to be treated in the hospital. You will be given antibiotics through an intravenous line (IV). A needle will be put in your hand or arm. The medicine will flow directly into your body through the IV. Initial hospital care is often needed for deep wounds or infections that have spread to the bone, joint, or blood.  HOME CARE INSTRUCTIONS  Take your antibiotics as directed. Finish them even if you start to feel better.   Rest at home until your caregiver says it is okay to go back to your normal activities.   Keep all follow-up appointments as directed. This is how your caregiver can make sure your treatment is working.  You may  need a tetanus shot if:  You cannot remember when you had your last tetanus shot.   You have never had a tetanus shot.   The injury broke your skin.  If you get a tetanus shot, your arm may swell, get red, and feel warm to the touch. This is common and not a problem. If you need a tetanus shot and you choose not to have one, there is a rare chance of getting tetanus. Sickness from tetanus can be serious. SEEK IMMEDIATE MEDICAL CARE IF:  Your pain from the wound gets worse.   You develop redness, warmth, or swelling around the wound.   You see fluid or pus leaking from the wound.   You have trouble moving the infected area or develop swelling of a joint.   You develop a bad headache or a stiff neck.   You have chest pain.   You have trouble breathing.   You have a fever.   You develop side effects from your medicines.  MAKE SURE YOU:  Understand these instructions.   Will watch your condition.   Will get help right away if you are not doing well or get worse.  Document Released: 07/18/2011 Document Revised: 10/19/2011 Document Reviewed: 07/18/2011 [facility] Patient Information 2012 [place], [place].

## 2012-03-27 NOTE — ED Provider Notes (Signed)
Medical screening examination/treatment/procedure(s) were conducted as a shared visit with non-physician practitioner(s) and myself.  I personally evaluated the patient during the encounter   61 year old female who states that she was bitten on her finger by a cat. This cat it hurts, it is up-to-date on shots, the cat accidentally missed the food and bit her finger while she was feeding it. There was no other abnormal behavior of the cat.  Physical exam, patient has injury to her right finger on the palmar surface, no active bleeding, this is a very small puncture wound, no spreading redness beyond that phalanx. He is able to make a fist without difficulty, normal sensation of the distal tip  Assessment:  Cat bite, puncture wound, local wound care, x-ray to rule out foreign body, antibiotics preventative, pain medication. Case and followup discussed with patient who agrees with same.  Vida Roller, MD 03/27/12 [phone]

## 2012-03-27 NOTE — ED Notes (Signed)
Rx given x1 D/c instructions reviewed w/ pt and family - pt and family deny any further questions or concerns at present. Pt ambulating independently on d/c w/ steady gait, A&Ox4 in no acute distress w/ family.

## 2012-03-27 NOTE — ED Notes (Signed)
Pt reports being bit by her elder cat earlier this evening while trying to give the cat a treat - cat is vaccinated. Pt w/ x2 small puncture wounds - non- bleeding - to fourth digit. Pt reports mild throbbing at present. Pt in no acute distress, family at bedside x1.

## 2012-04-26 ENCOUNTER — Telehealth: Payer: Self-pay | Admitting: Internal Medicine

## 2012-04-26 NOTE — Telephone Encounter (Signed)
Refill T3:T4 SR 12.5MCG+25MCG Capsule #90 Take one capsule each morning Last refill 2.28.13 Last ov 10.9.12

## 2012-04-26 NOTE — Telephone Encounter (Signed)
Reordered medication via phone [facility] Sig: take one capsule every morning #90x1/SLS

## 2012-07-24 ENCOUNTER — Encounter: Payer: Self-pay | Admitting: Obstetrics and Gynecology

## 2012-08-23 ENCOUNTER — Encounter: Payer: Self-pay | Admitting: Obstetrics and Gynecology

## 2012-08-23 ENCOUNTER — Ambulatory Visit (INDEPENDENT_AMBULATORY_CARE_PROVIDER_SITE_OTHER): Payer: Managed Care, Other (non HMO) | Admitting: Obstetrics and Gynecology

## 2012-08-23 VITALS — BP 124/76 | Ht 64.5 in | Wt 138.0 lb

## 2012-08-23 DIAGNOSIS — Z01419 Encounter for gynecological examination (general) (routine) without abnormal findings: Secondary | ICD-10-CM

## 2012-08-23 DIAGNOSIS — Z23 Encounter for immunization: Secondary | ICD-10-CM

## 2012-08-23 DIAGNOSIS — Z8619 Personal history of other infectious and parasitic diseases: Secondary | ICD-10-CM | POA: Insufficient documentation

## 2012-08-23 MED ORDER — BUTALBITAL-ASA-CAFFEINE 50-325-40 MG PO CAPS: ORAL_CAPSULE | ORAL | Status: DC

## 2012-08-23 NOTE — Patient Instructions (Signed)
Continue yearly mammograms 

## 2012-08-23 NOTE — Progress Notes (Signed)
Patient came to see me today for her annual GYN exam. She has had significant menopausal symptoms including headaches which have responded well to bioidentical hormones started by Dr. Alessandra Bevels. Prior to that she tried synthetic hormones and did not feel they worked. She is doing them and cream form which she  inserts vaginally. She is having no bleeding with them. She is having no pelvic pain. She rarely  gets headaches. She uses fiorinal  if she does. She just had a normal mammogram. She has had several normal bone densities. She's never had an abnormal Pap smear. Her last Pap smear was 2012. She does her lab through her PCP.  Physical examination: Kennon Portela present. HEENT within normal limits. Neck: Thyroid not large. No masses. Supraclavicular nodes: not enlarged. Breasts: Examined in both sitting and lying  position. No skin changes and no masses. Abdomen: Soft no guarding rebound or masses or hernia. Pelvic: External: Within normal limits. BUS: Within normal limits. Vaginal:within normal limits. Good estrogen effect. No evidence of cystocele rectocele or enterocele. Cervix: clean. Uterus: Normal size and shape. Adnexa: No masses. Rectovaginal exam: Confirmatory and negative. Extremities: Within normal limits.  Assessment: Menopausal symptoms  Plan: Discussed the unique issues with bioidentical hormones. Explained the lack of scientific data on safety. Discussed a higher risk of breast cancer with a synthetic hormones. For the moment patient will continue. She uses estradiol  .15 mg per ml cream day 1 to 5. She then uses estradiol 0.15 mg cream(78 gram) with 10% progesterone cream daily for the rest of the month. She will continue fiorinal as needed. She will continue yearly mammograms. Pap not done.The new Pap smear guidelines were discussed with the patient.

## 2012-08-24 LAB — URINALYSIS W MICROSCOPIC + REFLEX CULTURE
Bacteria, UA: NONE SEEN
Bilirubin Urine: NEGATIVE
Glucose, UA: NEGATIVE mg/dL
Protein, ur: NEGATIVE mg/dL
Urobilinogen, UA: 0.2 mg/dL (ref 0.0–1.0)

## 2012-09-23 ENCOUNTER — Ambulatory Visit: Payer: Managed Care, Other (non HMO) | Admitting: Internal Medicine

## 2012-09-23 ENCOUNTER — Ambulatory Visit (INDEPENDENT_AMBULATORY_CARE_PROVIDER_SITE_OTHER): Payer: Managed Care, Other (non HMO) | Admitting: Family Medicine

## 2012-09-23 ENCOUNTER — Encounter: Payer: Self-pay | Admitting: Family Medicine

## 2012-09-23 VITALS — BP 116/70 | HR 70 | Temp 98.4°F | Wt 142.0 lb

## 2012-09-23 DIAGNOSIS — Z2911 Encounter for prophylactic immunotherapy for respiratory syncytial virus (RSV): Secondary | ICD-10-CM

## 2012-09-23 DIAGNOSIS — E039 Hypothyroidism, unspecified: Secondary | ICD-10-CM | POA: Insufficient documentation

## 2012-09-23 DIAGNOSIS — Z23 Encounter for immunization: Secondary | ICD-10-CM

## 2012-09-23 DIAGNOSIS — L259 Unspecified contact dermatitis, unspecified cause: Secondary | ICD-10-CM

## 2012-09-23 MED ORDER — CLOBETASOL PROPIONATE 0.05 % EX FOAM: Freq: Two times a day (BID) | CUTANEOUS | Status: DC

## 2012-09-23 NOTE — Patient Instructions (Signed)

## 2012-09-23 NOTE — Assessment & Plan Note (Signed)
rx sent to pharmacy If it reoccurs rto when it first starts

## 2012-09-23 NOTE — Assessment & Plan Note (Signed)
Check labs today Pt has cpe with hopp in february

## 2012-09-23 NOTE — Addendum Note (Signed)
Addended by: Arnette Norris on: 09/23/2012 04:54 PM   Modules accepted: Orders

## 2012-09-23 NOTE — Progress Notes (Signed)
  Subjective:    Patient ID: Kristen [name], female    DOB: December 03, 1950, 61 y.o.   MRN: [id number]  HPI Pt here c/o rash on L side that comes and goes and is very itchy and takes 2 weeks to go away.     Review of Systems    as above Objective:   Physical Exam  Constitutional: She is oriented to person, place, and time. She appears well-developed and well-nourished.  Neurological: She is alert and oriented to person, place, and time.  Skin:       + papules in rectangle pattern on L side  + escoriations  Psychiatric: She has a normal mood and affect. Her behavior is normal. Judgment and thought content normal.          Assessment & Plan:

## 2012-10-08 ENCOUNTER — Telehealth: Payer: Self-pay | Admitting: Internal Medicine

## 2012-10-08 MED ORDER — AMBULATORY NON FORMULARY MEDICATION: Status: DC

## 2012-10-08 NOTE — Telephone Encounter (Signed)
RX was manually faxed, patient with pending appointment 12/2012

## 2012-10-08 NOTE — Telephone Encounter (Signed)
Refill:T3/T4 12.5 mcg-25 mcg capsule. Take 1 capsule by mouth every morning. Qty 90. Last fill 07-22-12

## 2012-11-26 ENCOUNTER — Other Ambulatory Visit: Payer: Self-pay | Admitting: Obstetrics and Gynecology

## 2012-11-26 NOTE — Telephone Encounter (Signed)
Called into pharmacy

## 2012-12-19 ENCOUNTER — Ambulatory Visit (INDEPENDENT_AMBULATORY_CARE_PROVIDER_SITE_OTHER): Payer: Managed Care, Other (non HMO) | Admitting: Internal Medicine

## 2012-12-19 ENCOUNTER — Encounter: Payer: Self-pay | Admitting: Internal Medicine

## 2012-12-19 VITALS — BP 116/80 | HR 87 | Temp 98.3°F | Resp 14 | Ht 64.08 in | Wt 136.0 lb

## 2012-12-19 DIAGNOSIS — Z0000 Encounter for general adult medical examination without abnormal findings: Secondary | ICD-10-CM

## 2012-12-19 DIAGNOSIS — L280 Lichen simplex chronicus: Secondary | ICD-10-CM

## 2012-12-19 LAB — HEPATIC FUNCTION PANEL
ALT: 22 U/L (ref 0–35)
AST: 19 U/L (ref 0–37)
Alkaline Phosphatase: 47 U/L (ref 39–117)
Bilirubin, Direct: 0.1 mg/dL (ref 0.0–0.3)
Total Bilirubin: 1.3 mg/dL — ABNORMAL HIGH (ref 0.3–1.2)

## 2012-12-19 LAB — BASIC METABOLIC PANEL
BUN: 17 mg/dL (ref 6–23)
Calcium: 9 mg/dL (ref 8.4–10.5)
GFR: 71.35 mL/min (ref >60.00)
Glucose, Bld: 81 mg/dL (ref 70–99)
Potassium: 3.5 mEq/L (ref 3.5–5.1)

## 2012-12-19 LAB — CBC WITH DIFFERENTIAL/PLATELET
Basophils Absolute: 0 10*3/uL (ref 0.0–0.1)
Eosinophils Relative: 0.7 % (ref 0.0–5.0)
HCT: 43 % (ref 36.0–46.0)
Lymphocytes Relative: 24.8 % (ref 12.0–46.0)
Monocytes Relative: 9.6 % (ref 3.0–12.0)
Neutrophils Relative %: 64.4 % (ref 43.0–77.0)
Platelets: 169 10*3/uL (ref 150.0–400.0)
RDW: 12.2 % (ref 11.5–14.6)
WBC: 5.2 10*3/uL (ref 4.5–10.5)

## 2012-12-19 LAB — LIPID PANEL
Cholesterol: 217 mg/dL — ABNORMAL HIGH (ref 0–200)
Total CHOL/HDL Ratio: 3
VLDL: 22.2 mg/dL (ref 0.0–40.0)

## 2012-12-19 LAB — LDL CHOLESTEROL, DIRECT: Direct LDL: 123.3 mg/dL

## 2012-12-19 MED ORDER — HYDROXYZINE HCL 10 MG PO TABS: 10.0000 mg | ORAL_TABLET | Freq: Four times a day (QID) | ORAL | Status: DC | PRN

## 2012-12-19 NOTE — Progress Notes (Signed)
Subjective:    Patient ID: Kristen [name], female    DOB: 07/16/1951, 62 y.o.   MRN: [id number]  HPI  Kristen [name] is here for a physical;acute issues include recurrent L thoracic rash.      Review of Systems Skin lesions began August 2013 as raised, red pruritic rash . This has recurred x 4 total ,lasting up to 2 weeks. Lesions were localized to L trunk in an irregular circular pattern. The lesions are described as slightly warm The dermatologic lesions have resolved with Rx  steroid cream. There was no associated trigger or context such as exposure to animals, ticks/insects, new medications, clothing , topical cosmetics, travel or environmental factors.  She does describe increased stress related to her family's health related problems. Her mother has end-stage emphysema and dementia yet lives by herself. Unfortunately her father recently died of aggressive bladder cancer.  Constitutional: no fever ; chills; change in weight ; excessive fatigue Allergy/immunologic: no sneezing ; rhinitis ; angioedema ; itchy eyes ; hives Eyes: no blurred/double/loss of vision  ; redness ; discharge Ears, nose and throat/mouth: no nasal congestion ; sore throat ; earache ;  facial pain ;frontal headache  Respirations:no cough ; sputum  ; dyspnea; wheezing GI:no nausea/vomiting ; diarrhea ; abdominal pain GU: no dysuria ; discharge  Musculoskeletal: no joint swelling or redness  Neurologic:no numbness or tingling  Hematologic/lymphatic:no abnormal bruising or bleeding ; enlarged lymph nodes            Objective:   Physical Exam Gen.: Healthy and well-nourished in appearance. Alert, appropriate and cooperative throughout exam.  Head: Normocephalic without obvious abnormalities  Eyes: No corneal or conjunctival inflammation noted. Pupils equal round reactive to light and accommodation. Fundal exam is benign without hemorrhages, exudate, papilledema. Extraocular motion intact. Vision grossly normal  with lenses. Ears: External  ear exam reveals no significant lesions or deformities. Canals with minimal wax.TMs normal. Hearing aids bilaterally. Nose: External nasal exam reveals no deformity or inflammation. Nasal mucosa are pink and moist. No lesions or exudates noted.   Mouth: Oral mucosa and oropharynx reveal no lesions or exudates. Teeth in good repair. Neck: No deformities, masses, or tenderness noted. Range of motion & Thyroid normal. Lungs: Normal respiratory effort; chest expands symmetrically. Lungs are clear to auscultation without rales, wheezes, or increased work of breathing. Heart: Normal rate and rhythm. Normal S1 and S2. No gallop, click, or rub. S4 w/o murmur. Abdomen: Bowel sounds normal; abdomen soft and nontender. No masses, organomegaly or hernias noted.Aorta palpable ; no AAA . Genitalia: Dr Eda Paschal Musculoskeletal/extremities: No deformity or scoliosis noted of  the thoracic or lumbar spine. No clubbing, cyanosis, edema, or significant extremity  deformity noted. Range of motion normal .Tone & strength  normal.Joints normal. Nail health good. Able to lie down & sit up w/o help. Negative SLR bilaterally Vascular: Carotid, radial artery, dorsalis pedis and  posterior tibial pulses are full and equal. Aortic bruit present. Neurologic: Alert and oriented x3. Deep tendon reflexes symmetrical and normal.  Skin: There is minimal hyperpigmentation and dryness in a circular pattern and load his ear in the left axillary line area. No significant dermatographia is present. Lymph: No cervical, axillary lymphadenopathy present. Psych: Mood and affect are normal. 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

## 2012-12-19 NOTE — Patient Instructions (Addendum)
Preventive Health Care: Exercise  30-45  minutes a day, 3-4 days a week. Walking is especially valuable in preventing Osteoporosis. Eat a low-fat diet with lots of fruits and vegetables, up to 7-9 servings per day. This would eliminate need for vitamin supplements for most individuals. Consume less than 30 grams of sugar per day from foods & drinks with High Fructose Corn Syrup as #1,2,3 or #4 on label. Use Eucerin or Aveeno Daily  Moisturizing Lotion  twice a day  for the dry skin. Bathe with moisturizing liquid soap , not bar soap.  If you activate My Chart; the results can be released to you as soon as they populate from the lab. If you choose not to use this program; the labs have to be reviewed, copied & mailed causing a delay in getting the results to you.

## 2012-12-22 LAB — VITAMIN D 1,25 DIHYDROXY
Vitamin D 1, 25 (OH)2 Total: 61 pg/mL (ref 18–72)
Vitamin D2 1, 25 (OH)2: <8 pg/mL
Vitamin D3 1, 25 (OH)2: 61 pg/mL

## 2013-04-28 ENCOUNTER — Other Ambulatory Visit: Payer: Self-pay | Admitting: *Deleted

## 2013-04-28 MED ORDER — AMBULATORY NON FORMULARY MEDICATION: Status: DC

## 2013-04-28 NOTE — Telephone Encounter (Signed)
Rx sent 

## 2013-06-10 ENCOUNTER — Other Ambulatory Visit: Payer: Self-pay | Admitting: Gynecology

## 2013-06-10 ENCOUNTER — Other Ambulatory Visit: Payer: Self-pay

## 2013-06-10 MED ORDER — BUTALBITAL-ASPIRIN-CAFFEINE 50-325-40 MG PO TABS: ORAL_TABLET | ORAL | Status: DC

## 2013-06-10 NOTE — Telephone Encounter (Signed)
Called in to [facility].

## 2013-06-10 NOTE — Telephone Encounter (Signed)
Former patient of Dr. Verl Dicker.  She is current with her yearly exam. At her 08/23/12 CE he wrote "She rarely gets headaches. She uses Fiorinal if she does."

## 2013-07-29 ENCOUNTER — Encounter: Payer: Self-pay | Admitting: Gynecology

## 2013-08-27 ENCOUNTER — Encounter: Payer: Self-pay | Admitting: Gynecology

## 2013-08-27 ENCOUNTER — Other Ambulatory Visit: Payer: Self-pay

## 2013-08-27 MED ORDER — NONFORMULARY OR COMPOUNDED ITEM: Status: DC

## 2013-08-27 NOTE — Telephone Encounter (Signed)
Patient has yearly exam scheduled for 09/02/13.

## 2013-09-02 ENCOUNTER — Encounter: Payer: Self-pay | Admitting: Gynecology

## 2013-09-02 ENCOUNTER — Other Ambulatory Visit (HOSPITAL_COMMUNITY)
Admission: RE | Admit: 2013-09-02 | Discharge: 2013-09-02 | Disposition: A | Discharge status: Home / Self Care | Payer: Managed Care, Other (non HMO) | Source: Ambulatory Visit | Attending: Gynecology | Admitting: Gynecology

## 2013-09-02 ENCOUNTER — Ambulatory Visit (INDEPENDENT_AMBULATORY_CARE_PROVIDER_SITE_OTHER): Payer: Managed Care, Other (non HMO) | Admitting: Gynecology

## 2013-09-02 VITALS — BP 120/76 | Ht 65.0 in | Wt 141.0 lb

## 2013-09-02 DIAGNOSIS — Z01419 Encounter for gynecological examination (general) (routine) without abnormal findings: Secondary | ICD-10-CM | POA: Insufficient documentation

## 2013-09-02 DIAGNOSIS — N951 Menopausal and female climacteric states: Secondary | ICD-10-CM

## 2013-09-02 DIAGNOSIS — N952 Postmenopausal atrophic vaginitis: Secondary | ICD-10-CM

## 2013-09-02 DIAGNOSIS — N841 Polyp of cervix uteri: Secondary | ICD-10-CM

## 2013-09-02 DIAGNOSIS — Z23 Encounter for immunization: Secondary | ICD-10-CM

## 2013-09-02 DIAGNOSIS — Z79890 Hormone replacement therapy (postmenopausal): Secondary | ICD-10-CM

## 2013-09-02 MED ORDER — NONFORMULARY OR COMPOUNDED ITEM: Status: DC

## 2013-09-02 MED ORDER — BUTALBITAL-ASPIRIN-CAFFEINE 50-325-40 MG PO TABS: ORAL_TABLET | ORAL | Status: DC

## 2013-09-02 NOTE — Addendum Note (Signed)
Addended by: Dayna Barker on: 09/02/2013 08:46 AM   Modules accepted: Orders

## 2013-09-02 NOTE — Progress Notes (Signed)
EDIA PURSIFULL 29-Nov-1950 [id number]        62 y.o.  G2P2001 for annual exam.  Former patient of Dr. Eda Paschal. Several issues noted below.  Past medical history,surgical history, medications, allergies, family history and social history were all reviewed and documented in the EPIC chart.  ROS:  Performed and pertinent positives and negatives are included in the history, assessment and plan .  Exam: Kim assistant Filed Vitals:   09/02/13 0756  BP: 120/76  Height: 5\' 5"  (1.651 m)  Weight: 141 lb (63.957 kg)   General appearance  Normal Skin grossly normal Head/Neck normal with no cervical or supraclavicular adenopathy thyroid normal Lungs  clear Cardiac RR, without RMG Abdominal  soft, nontender, without masses, organomegaly or hernia Breasts  examined lying and sitting without masses, retractions, discharge or axillary adenopathy. Pelvic  Ext/BUS/vagina  normal with mild atrophic changes  Cervix  classic endocervical polyp. Polyp biopsied off with silver nitrate applied afterwards. Pap smear done  Uterus  anteverted, normal size, shape and contour, midline and mobile nontender   Adnexa  Without masses or tenderness    Anus and perineum  normal   Rectovaginal  normal sphincter tone without palpated masses or tenderness.    Assessment/Plan:  62 y.o. G7P2001 female for annual exam.   62. Postmenopausal/HRT. Patient is on a bioidentical HRT regimen to initially started by Dr. Alessandra Bevels and continued by Dr. Eda Paschal. He had discussed the risks and the lack of scientific studies supporting bioidentical hormone use.  I reviewed the whole issue of HRT with her to include the WHI study with increased risk of stroke, heart attack, DVT and breast cancer. The ACOG and NAMS statements for lowest dose for the shortest period of time reviewed. Transdermal versus oral first-pass effect benefit discussed.  I again reviewed bioidentical hormone use and lack of supporting studies. Patient is fully  aware of this and wants to continue.  She uses estradiol  .15 mg per ml cream day 1 to 5. She then uses estradiol 0.15 mg cream(78 gram) with 10% progesterone cream daily for the rest of the month.  She does no bleeding at all. I refilled these for one year. She knows to report any bleeding. 2. Endocervical polyp. This was biopsied off and sent to pathology. Smear was also done. Last Pap smear 2012 was normal. No history of significant abnormal Pap smears. We'll plan less frequent screening intervals assuming this Pap smear is normal. 3. Headaches. Patient has occasional tension headaches for which she uses Fiorinal for per Dr. Eda Paschal. Still has prescription from last year. I refilled her #30 with 2 refills. 4. Mammography 07/2013. Continue with annual mammography. SBE monthly reviewed. 5. DEXA 2009 normal. Plan repeat further into the 60s. Increase calcium vitamin D reviewed. 6. Colonoscopy 2011. Repeat at their recommended interval. 7. Health maintenance. No blood work done as this is done through her primary physician's office. Followup one year, sooner as needed.    Note: This document was prepared with digital dictation and possible smart phrase technology. Any transcriptional errors that result from this process are unintentional.   Dara Lords MD, 8:24 AM 09/02/2013

## 2013-09-02 NOTE — Patient Instructions (Signed)
Follow up in one year for annual exam 

## 2013-09-03 LAB — URINALYSIS W MICROSCOPIC + REFLEX CULTURE
Bilirubin Urine: NEGATIVE
Casts: NONE SEEN
Glucose, UA: NEGATIVE mg/dL
Hgb urine dipstick: NEGATIVE
Squamous Epithelial / LPF: NONE SEEN
pH: 7 (ref 5.0–8.0)

## 2013-09-05 ENCOUNTER — Other Ambulatory Visit: Payer: Self-pay | Admitting: *Deleted

## 2013-09-05 MED ORDER — NITROFURANTOIN MONOHYD MACRO 100 MG PO CAPS: 100.0000 mg | ORAL_CAPSULE | Freq: Two times a day (BID) | ORAL | Status: DC

## 2013-09-06 LAB — URINE CULTURE: Colony Count: >=100000

## 2013-09-26 ENCOUNTER — Encounter: Payer: Self-pay | Admitting: Internal Medicine

## 2013-09-26 NOTE — Telephone Encounter (Signed)
error 

## 2013-10-15 ENCOUNTER — Other Ambulatory Visit: Payer: Self-pay | Admitting: Internal Medicine

## 2013-10-16 NOTE — Telephone Encounter (Signed)
Nonformulary/compound item refilled

## 2013-12-23 ENCOUNTER — Encounter: Payer: Self-pay | Admitting: Internal Medicine

## 2013-12-23 ENCOUNTER — Other Ambulatory Visit (INDEPENDENT_AMBULATORY_CARE_PROVIDER_SITE_OTHER): Payer: Managed Care, Other (non HMO)

## 2013-12-23 ENCOUNTER — Ambulatory Visit (INDEPENDENT_AMBULATORY_CARE_PROVIDER_SITE_OTHER): Payer: Managed Care, Other (non HMO) | Admitting: Internal Medicine

## 2013-12-23 VITALS — BP 120/74 | HR 67 | Temp 98.3°F | Ht 64.07 in | Wt 142.2 lb

## 2013-12-23 DIAGNOSIS — Z0000 Encounter for general adult medical examination without abnormal findings: Secondary | ICD-10-CM

## 2013-12-23 DIAGNOSIS — E039 Hypothyroidism, unspecified: Secondary | ICD-10-CM

## 2013-12-23 DIAGNOSIS — E785 Hyperlipidemia, unspecified: Secondary | ICD-10-CM

## 2013-12-23 LAB — CBC WITH DIFFERENTIAL/PLATELET
BASOS PCT: 0.4 % (ref 0.0–3.0)
Basophils Absolute: 0 10*3/uL (ref 0.0–0.1)
Eosinophils Absolute: 0.1 10*3/uL (ref 0.0–0.7)
Eosinophils Relative: 1.1 % (ref 0.0–5.0)
HCT: 44.5 % (ref 36.0–46.0)
Hemoglobin: 15.4 g/dL — ABNORMAL HIGH (ref 12.0–15.0)
Lymphocytes Relative: 23 % (ref 12.0–46.0)
Lymphs Abs: 1.3 10*3/uL (ref 0.7–4.0)
MCHC: 34.5 g/dL (ref 30.0–36.0)
MCV: 95.9 fl (ref 78.0–100.0)
Monocytes Absolute: 0.5 10*3/uL (ref 0.1–1.0)
Monocytes Relative: 9.8 % (ref 3.0–12.0)
Neutro Abs: 3.7 10*3/uL (ref 1.4–7.7)
Neutrophils Relative %: 65.7 % (ref 43.0–77.0)
Platelets: 182 10*3/uL (ref 150.0–400.0)
RBC: 4.64 Mil/uL (ref 3.87–5.11)
RDW: 11.9 % (ref 11.5–14.6)
WBC: 5.6 10*3/uL (ref 4.5–10.5)

## 2013-12-23 LAB — BASIC METABOLIC PANEL
BUN: 13 mg/dL (ref 6–23)
CALCIUM: 9.3 mg/dL (ref 8.4–10.5)
CO2: 27 mEq/L (ref 19–32)
Chloride: 105 mEq/L (ref 96–112)
Creatinine, Ser: 0.7 mg/dL (ref 0.4–1.2)
GFR: 85.91 mL/min (ref >60.00)
GLUCOSE: 95 mg/dL (ref 70–99)
Potassium: 4.1 mEq/L (ref 3.5–5.1)
Sodium: 140 mEq/L (ref 135–145)

## 2013-12-23 LAB — LIPID PANEL
Cholesterol: 235 mg/dL — ABNORMAL HIGH (ref 0–200)
HDL: 67 mg/dL (ref >39.00)
Total CHOL/HDL Ratio: 4
Triglycerides: 127 mg/dL (ref 0.0–149.0)
VLDL: 25.4 mg/dL (ref 0.0–40.0)

## 2013-12-23 LAB — HEPATIC FUNCTION PANEL
ALT: 20 U/L (ref 0–35)
AST: 22 U/L (ref 0–37)
Albumin: 4.4 g/dL (ref 3.5–5.2)
Alkaline Phosphatase: 50 U/L (ref 39–117)
BILIRUBIN DIRECT: 0.1 mg/dL (ref 0.0–0.3)
TOTAL PROTEIN: 7.1 g/dL (ref 6.0–8.3)
Total Bilirubin: 1.8 mg/dL — ABNORMAL HIGH (ref 0.3–1.2)

## 2013-12-23 LAB — TSH: TSH: 1.18 u[IU]/mL (ref 0.35–5.50)

## 2013-12-23 LAB — LDL CHOLESTEROL, DIRECT: LDL DIRECT: 148.2 mg/dL

## 2013-12-23 NOTE — Progress Notes (Signed)
   Subjective:    Patient ID: Kristen [name], female    DOB: 22-Dec-1950, 63 y.o.   MRN: [id number]  HPI  She is here for a physical;acute issues denied.    Review of Systems  There has been no change in the dose, brand ,mode of administration of thyroid supplement Constitutional: No significant change in weight; significant fatigue; sleep disorder; change in appetite. Eye: no blurred, double ,loss of vision Cardiovascular: no palpitations; racing; irregularity ENT/GI: no constipation; diarrhea;hoarseness;dysphagia Derm: no change in nails,hair,skin Neuro: no numbness or tingling; tremor Psych:no anxiety; depression; panic attacks Endo: no temperature intolerance to heat ,cold       Objective:   Physical Exam Gen.: Healthy and well-nourished in appearance. Alert, appropriate and cooperative throughout exam. Appears younger than stated age  Head: Normocephalic without obvious abnormalities Eyes: No corneal or conjunctival inflammation noted. Pupils equal round reactive to light and accommodation. Extraocular motion intact.  Ears: External  ear exam reveals no significant lesions or deformities. Canals clear .TMs normal. Hearing aids bilaterally. Nose: External nasal exam reveals no deformity or inflammation. Nasal mucosa are pink and moist. No lesions or exudates noted.  Mouth: Oral mucosa and oropharynx reveal no lesions or exudates. Teeth in good repair. Neck: No deformities, masses, or tenderness noted. Range of motion & Thyroid normal. Lungs: Normal respiratory effort; chest expands symmetrically. Lungs are clear to auscultation without rales, wheezes, or increased work of breathing. Heart: Normal rate and rhythm. Normal S1 and S2. No gallop, click, or rub. S4 w/o murmur. Abdomen: Bowel sounds normal; abdomen soft and nontender. No masses, organomegaly or hernias noted. Genitalia: as per Gyn                                  Musculoskeletal/extremities: No deformity or scoliosis  noted of  the thoracic or lumbar spine.  No clubbing, cyanosis, edema, or significant extremity  deformity noted. Range of motion normal .Tone & strength normal. Hand joints normal . Fingernail  health good. Able to lie down & sit up w/o help. Negative SLR bilaterally Vascular: Carotid, radial artery, dorsalis pedis and  posterior tibial pulses are full and equal. No bruits present. Neurologic: Alert and oriented x3. Deep tendon reflexes symmetrical and normal.       Skin: Intact without suspicious lesions or rashes. Lymph: No cervical, axillary lymphadenopathy present. Psych: Mood and affect are normal. Normally interactive                                                                                        Assessment & Plan:  #1 comprehensive physical exam; no acute findings  Plan: see Orders  & Recommendations

## 2013-12-23 NOTE — Patient Instructions (Signed)
Your next office appointment will be determined based upon review of your pending labs. Those instructions will be transmitted to you through My Chart . 

## 2013-12-23 NOTE — Progress Notes (Signed)
Pre-visit discussion using our clinic review tool. No additional management support is needed unless otherwise documented below in the visit note.  

## 2014-01-06 ENCOUNTER — Telehealth: Payer: Self-pay | Admitting: Internal Medicine

## 2014-01-06 NOTE — Telephone Encounter (Signed)
Pt call to check up on the paper work the she drop off on 12/23/13. Please check and call pt back. This paper work need to be done Friday 01/09/14.

## 2014-01-07 NOTE — Telephone Encounter (Signed)
Spoke with the pt and informed her the form is ready, and she asked if I would fax it.  Wellness form was faxed.//AB/CMA

## 2014-04-10 ENCOUNTER — Other Ambulatory Visit: Payer: Self-pay | Admitting: Internal Medicine

## 2014-07-31 ENCOUNTER — Encounter: Payer: Self-pay | Admitting: Gynecology

## 2014-09-03 ENCOUNTER — Ambulatory Visit (INDEPENDENT_AMBULATORY_CARE_PROVIDER_SITE_OTHER): Payer: Managed Care, Other (non HMO) | Admitting: Gynecology

## 2014-09-03 ENCOUNTER — Encounter: Payer: Self-pay | Admitting: Gynecology

## 2014-09-03 VITALS — BP 120/76 | Ht 64.0 in | Wt 145.0 lb

## 2014-09-03 DIAGNOSIS — N952 Postmenopausal atrophic vaginitis: Secondary | ICD-10-CM

## 2014-09-03 DIAGNOSIS — Z79890 Hormone replacement therapy (postmenopausal): Secondary | ICD-10-CM

## 2014-09-03 DIAGNOSIS — R519 Headache, unspecified: Secondary | ICD-10-CM

## 2014-09-03 DIAGNOSIS — Z23 Encounter for immunization: Secondary | ICD-10-CM

## 2014-09-03 DIAGNOSIS — R51 Headache: Secondary | ICD-10-CM

## 2014-09-03 MED ORDER — BUTALBITAL-ASPIRIN-CAFFEINE 50-325-40 MG PO TABS: ORAL_TABLET | ORAL | Status: DC

## 2014-09-03 NOTE — Addendum Note (Signed)
Addended by: Nelva Nay on: 09/03/2014 03:10 PM   Modules accepted: Orders

## 2014-09-03 NOTE — Progress Notes (Signed)
Kristen [name] 01-02-1951 [id number]        63 y.o.  G2P2001 for annual exam.  Doing well. Several issues noted below.  Past medical history,surgical history, problem list, medications, allergies, family history and social history were all reviewed and documented as reviewed in the EPIC chart.  ROS:  12 system ROS performed with pertinent positives and negatives included in the history, assessment and plan.   Additional significant findings :  none   Exam: Kim Counsellor Vitals:   09/03/14 1433  BP: 120/76  Height: 5\' 4"  (1.626 m)  Weight: 145 lb (65.772 kg)   General appearance:  Normal affect, orientation and appearance. Skin: Grossly normal HEENT: Without gross lesions.  No cervical or supraclavicular adenopathy. Thyroid normal.  Lungs:  Clear without wheezing, rales or rhonchi Cardiac: RR, without RMG Abdominal:  Soft, nontender, without masses, guarding, rebound, organomegaly or hernia Breasts:  Examined lying and sitting without masses, retractions, discharge or axillary adenopathy. Pelvic:  Ext/BUS/vagina normal with mild atrophic changes  Cervix normal with mild atrophic changes  Uterus anteverted, normal size, shape and contour, midline and mobile nontender   Adnexa  Without masses or tenderness    Anus and perineum  Normal   Rectovaginal  Normal sphincter tone without palpated masses or tenderness.    Assessment/Plan:  63 y.o. G37P2001 female for annual exam .   1. Postmenopausal/HRT.  Patient continues on her bioidentical regiment initiated by Dr. Sharol Roussel.  She uses estradiol  .15 mg per ml cream day 1 to 5. She then uses estradiol 0.15 mg cream(78 gram) with 10% progesterone cream daily for the rest of the month.  Does well with this. No bleeding. I begin reviewed the whole issue of HRT with her to include the WHI study with increased risk of stroke, heart attack, DVT and breast cancer. The ACOG and NAMS statements for lowest dose for the shortest period of time  reviewed. The issues of bioidentical hormones and lack of scientific studies to support the safety of using these. The patient appears to be getting a good relief of her symptoms and she wants to continue. She has a supply at home and will have her pharmacy call as when they need a refill. She does report any vaginal bleeding. 2. Pap smear 2014. No Pap smear done today. No history of significant abnormal Pap smears. Plan repeat Pap smear in 3 year interval. 3. Mammography 07/2014. Continue with annual mammography. SBE monthly reviewed. 4. DEXA 2009 normal. Will plan repeat at age 69. Increase calcium vitamin D reviewed. 5. Colonoscopy 2011. Repeat that they're recommended interval. 6. Health maintenance. No routine blood work done as this is done through Dr. Darrol Angel office. Follow up one year, sooner as needed.     [name] MD, 2:53 PM 09/03/2014

## 2014-09-03 NOTE — Patient Instructions (Signed)
You may obtain a copy of any labs that were done today by logging onto MyChart as outlined in the instructions provided with your AVS (after visit summary). The office will not call with normal lab results but certainly if there are any significant abnormalities then we will contact you.   Health Maintenance, Female A healthy lifestyle and preventative care can promote health and wellness.  Maintain regular health, dental, and eye exams.  Eat a healthy diet. Foods like vegetables, fruits, whole grains, low-fat dairy products, and lean protein foods contain the nutrients you need without too many calories. Decrease your intake of foods high in solid fats, added sugars, and salt. Get information about a proper diet from your caregiver, if necessary.  Regular physical exercise is one of the most important things you can do for your health. Most adults should get at least 150 minutes of moderate-intensity exercise (any activity that increases your heart rate and causes you to sweat) each week. In addition, most adults need muscle-strengthening exercises on 2 or more days a week.   Maintain a healthy weight. The body mass index (BMI) is a screening tool to identify possible weight problems. It provides an estimate of body fat based on height and weight. Your caregiver can help determine your BMI, and can help you achieve or maintain a healthy weight. For adults 20 years and older:  A BMI below 18.5 is considered underweight.  A BMI of 18.5 to 24.9 is normal.  A BMI of 25 to 29.9 is considered overweight.  A BMI of 30 and above is considered obese.  Maintain normal blood lipids and cholesterol by exercising and minimizing your intake of saturated fat. Eat a balanced diet with plenty of fruits and vegetables. Blood tests for lipids and cholesterol should begin at age 61 and be repeated every 5 years. If your lipid or cholesterol levels are high, you are over 50, or you are a high risk for heart  disease, you may need your cholesterol levels checked more frequently.Ongoing high lipid and cholesterol levels should be treated with medicines if diet and exercise are not effective.  If you smoke, find out from your caregiver how to quit. If you do not use tobacco, do not start.  Lung cancer screening is recommended for adults aged 33 80 years who are at high risk for developing lung cancer because of a history of smoking. Yearly low-dose computed tomography (CT) is recommended for people who have at least a 30-pack-year history of smoking and are a current smoker or have quit within the past 15 years. A pack year of smoking is smoking an average of 1 pack of cigarettes a day for 1 year (for example: 1 pack a day for 30 years or 2 packs a day for 15 years). Yearly screening should continue until the smoker has stopped smoking for at least 15 years. Yearly screening should also be stopped for people who develop a health problem that would prevent them from having lung cancer treatment.  If you are pregnant, do not drink alcohol. If you are breastfeeding, be very cautious about drinking alcohol. If you are not pregnant and choose to drink alcohol, do not exceed 1 drink per day. One drink is considered to be 12 ounces (355 mL) of beer, 5 ounces (148 mL) of wine, or 1.5 ounces (44 mL) of liquor.  Avoid use of street drugs. Do not share needles with anyone. Ask for help if you need support or instructions about stopping  the use of drugs.  High blood pressure causes heart disease and increases the risk of stroke. Blood pressure should be checked at least every 1 to 2 years. Ongoing high blood pressure should be treated with medicines, if weight loss and exercise are not effective.  If you are 59 to 62 years old, ask your caregiver if you should take aspirin to prevent strokes.  Diabetes screening involves taking a blood sample to check your fasting blood sugar level. This should be done once every 3  years, after age 91, if you are within normal weight and without risk factors for diabetes. Testing should be considered at a younger age or be carried out more frequently if you are overweight and have at least 1 risk factor for diabetes.  Breast cancer screening is essential preventative care for women. You should practice "breast self-awareness." This means understanding the normal appearance and feel of your breasts and may include breast self-examination. Any changes detected, no matter how small, should be reported to a caregiver. Women in their 66s and 30s should have a clinical breast exam (CBE) by a caregiver as part of a regular health exam every 1 to 3 years. After age 101, women should have a CBE every year. Starting at age 100, women should consider having a mammogram (breast X-ray) every year. Women who have a family history of breast cancer should talk to their caregiver about genetic screening. Women at a high risk of breast cancer should talk to their caregiver about having an MRI and a mammogram every year.  Breast cancer gene (BRCA)-related cancer risk assessment is recommended for women who have family members with BRCA-related cancers. BRCA-related cancers include breast, ovarian, tubal, and peritoneal cancers. Having family members with these cancers may be associated with an increased risk for harmful changes (mutations) in the breast cancer genes BRCA1 and BRCA2. Results of the assessment will determine the need for genetic counseling and BRCA1 and BRCA2 testing.  The Pap test is a screening test for cervical cancer. Women should have a Pap test starting at age 57. Between ages 25 and 35, Pap tests should be repeated every 2 years. Beginning at age 37, you should have a Pap test every 3 years as long as the past 3 Pap tests have been normal. If you had a hysterectomy for a problem that was not cancer or a condition that could lead to cancer, then you no longer need Pap tests. If you are  between ages 50 and 76, and you have had normal Pap tests going back 10 years, you no longer need Pap tests. If you have had past treatment for cervical cancer or a condition that could lead to cancer, you need Pap tests and screening for cancer for at least 20 years after your treatment. If Pap tests have been discontinued, risk factors (such as a new sexual partner) need to be reassessed to determine if screening should be resumed. Some women have medical problems that increase the chance of getting cervical cancer. In these cases, your caregiver may recommend more frequent screening and Pap tests.  The human papillomavirus (HPV) test is an additional test that may be used for cervical cancer screening. The HPV test looks for the virus that can cause the cell changes on the cervix. The cells collected during the Pap test can be tested for HPV. The HPV test could be used to screen women aged 44 years and older, and should be used in women of any age  who have unclear Pap test results. After the age of 55, women should have HPV testing at the same frequency as a Pap test.  Colorectal cancer can be detected and often prevented. Most routine colorectal cancer screening begins at the age of 44 and continues through age 20. However, your caregiver may recommend screening at an earlier age if you have risk factors for colon cancer. On a yearly basis, your caregiver may provide home test kits to check for hidden blood in the stool. Use of a small camera at the end of a tube, to directly examine the colon (sigmoidoscopy or colonoscopy), can detect the earliest forms of colorectal cancer. Talk to your caregiver about this at age 86, when routine screening begins. Direct examination of the colon should be repeated every 5 to 10 years through age 13, unless early forms of pre-cancerous polyps or small growths are found.  Hepatitis C blood testing is recommended for all people born from [birth year] through 1964 and any  individual with known risks for hepatitis C.  Practice safe sex. Use condoms and avoid high-risk sexual practices to reduce the spread of sexually transmitted infections (STIs). Sexually active women aged 36 and younger should be checked for Chlamydia, which is a common sexually transmitted infection. Older women with new or multiple partners should also be tested for Chlamydia. Testing for other STIs is recommended if you are sexually active and at increased risk.  Osteoporosis is a disease in which the bones lose minerals and strength with aging. This can result in serious bone fractures. The risk of osteoporosis can be identified using a bone density scan. Women ages 20 and over and women at risk for fractures or osteoporosis should discuss screening with their caregivers. Ask your caregiver whether you should be taking a calcium supplement or vitamin D to reduce the rate of osteoporosis.  Menopause can be associated with physical symptoms and risks. Hormone replacement therapy is available to decrease symptoms and risks. You should talk to your caregiver about whether hormone replacement therapy is right for you.  Use sunscreen. Apply sunscreen liberally and repeatedly throughout the day. You should seek shade when your shadow is shorter than you. Protect yourself by wearing long sleeves, pants, a wide-brimmed hat, and sunglasses year round, whenever you are outdoors.  Notify your caregiver of new moles or changes in moles, especially if there is a change in shape or color. Also notify your caregiver if a mole is larger than the size of a pencil eraser.  Stay current with your immunizations. Document Released: 05/15/2011 Document Revised: 02/24/2013 Document Reviewed: 05/15/2011 [facility] Patient Information 2014 [employer].

## 2014-09-09 ENCOUNTER — Other Ambulatory Visit: Payer: Self-pay | Admitting: *Deleted

## 2014-09-09 MED ORDER — AMBULATORY NON FORMULARY MEDICATION: Status: DC

## 2014-09-14 ENCOUNTER — Encounter: Payer: Self-pay | Admitting: Gynecology

## 2014-10-07 ENCOUNTER — Other Ambulatory Visit: Payer: Self-pay | Admitting: Gynecology

## 2014-10-07 ENCOUNTER — Other Ambulatory Visit: Payer: Self-pay

## 2014-10-07 MED ORDER — AMBULATORY NON FORMULARY MEDICATION: Status: DC

## 2014-10-07 NOTE — Telephone Encounter (Signed)
I  Verified with pharmacy that they never received Rx for this prescribed on 09/03/14.  I provided them with the Rx as documented for that date.

## 2014-10-07 NOTE — Telephone Encounter (Signed)
Script has been faxed to [facility]

## 2014-12-22 ENCOUNTER — Other Ambulatory Visit: Payer: Self-pay

## 2014-12-22 MED ORDER — NONFORMULARY OR COMPOUNDED ITEM: Status: DC

## 2014-12-24 ENCOUNTER — Other Ambulatory Visit (INDEPENDENT_AMBULATORY_CARE_PROVIDER_SITE_OTHER): Payer: Managed Care, Other (non HMO)

## 2014-12-24 ENCOUNTER — Ambulatory Visit (INDEPENDENT_AMBULATORY_CARE_PROVIDER_SITE_OTHER): Payer: Managed Care, Other (non HMO) | Admitting: Internal Medicine

## 2014-12-24 ENCOUNTER — Encounter: Payer: Self-pay | Admitting: Internal Medicine

## 2014-12-24 VITALS — BP 118/74 | HR 67 | Temp 98.2°F | Resp 14 | Ht 64.0 in | Wt 147.1 lb

## 2014-12-24 DIAGNOSIS — Z0189 Encounter for other specified special examinations: Secondary | ICD-10-CM

## 2014-12-24 DIAGNOSIS — E785 Hyperlipidemia, unspecified: Secondary | ICD-10-CM

## 2014-12-24 DIAGNOSIS — Z0000 Encounter for general adult medical examination without abnormal findings: Secondary | ICD-10-CM

## 2014-12-24 DIAGNOSIS — E038 Other specified hypothyroidism: Secondary | ICD-10-CM

## 2014-12-24 LAB — CBC WITH DIFFERENTIAL/PLATELET
BASOS ABS: 0 10*3/uL (ref 0.0–0.1)
BASOS PCT: 0.3 % (ref 0.0–3.0)
Eosinophils Absolute: 0.1 10*3/uL (ref 0.0–0.7)
Eosinophils Relative: 1.2 % (ref 0.0–5.0)
HCT: 43.2 % (ref 36.0–46.0)
Hemoglobin: 15.4 g/dL — ABNORMAL HIGH (ref 12.0–15.0)
LYMPHS PCT: 23.1 % (ref 12.0–46.0)
Lymphs Abs: 1.4 10*3/uL (ref 0.7–4.0)
MCHC: 35.5 g/dL (ref 30.0–36.0)
MCV: 92.3 fl (ref 78.0–100.0)
Monocytes Absolute: 0.6 10*3/uL (ref 0.1–1.0)
Monocytes Relative: 9.8 % (ref 3.0–12.0)
NEUTROS PCT: 65.6 % (ref 43.0–77.0)
Neutro Abs: 4 10*3/uL (ref 1.4–7.7)
Platelets: 183 10*3/uL (ref 150.0–400.0)
RBC: 4.68 Mil/uL (ref 3.87–5.11)
RDW: 12.4 % (ref 11.5–15.5)
WBC: 6.1 10*3/uL (ref 4.0–10.5)

## 2014-12-24 LAB — LIPID PANEL
Cholesterol: 222 mg/dL — ABNORMAL HIGH (ref 0–200)
HDL: 64.4 mg/dL (ref >39.00)
LDL CALC: 119 mg/dL — AB (ref 0–99)
NonHDL: 157.6
Total CHOL/HDL Ratio: 3
Triglycerides: 194 mg/dL — ABNORMAL HIGH (ref 0.0–149.0)
VLDL: 38.8 mg/dL (ref 0.0–40.0)

## 2014-12-24 LAB — HEPATIC FUNCTION PANEL
ALBUMIN: 4.5 g/dL (ref 3.5–5.2)
ALT: 20 U/L (ref 0–35)
AST: 20 U/L (ref 0–37)
Alkaline Phosphatase: 53 U/L (ref 39–117)
Bilirubin, Direct: 0.2 mg/dL (ref 0.0–0.3)
Total Bilirubin: 1.5 mg/dL — ABNORMAL HIGH (ref 0.2–1.2)
Total Protein: 7 g/dL (ref 6.0–8.3)

## 2014-12-24 LAB — BASIC METABOLIC PANEL
BUN: 20 mg/dL (ref 6–23)
CHLORIDE: 105 meq/L (ref 96–112)
CO2: 30 mEq/L (ref 19–32)
Calcium: 9.3 mg/dL (ref 8.4–10.5)
Creatinine, Ser: 0.9 mg/dL (ref 0.40–1.20)
GFR: 67.26 mL/min (ref >60.00)
Glucose, Bld: 89 mg/dL (ref 70–99)
POTASSIUM: 3.6 meq/L (ref 3.5–5.1)
Sodium: 141 mEq/L (ref 135–145)

## 2014-12-24 LAB — TSH: TSH: 1.69 u[IU]/mL (ref 0.35–4.50)

## 2014-12-24 NOTE — Progress Notes (Signed)
Subjective:    Patient ID: Kristen [name], female    DOB: 1951/02/01, 64 y.o.   MRN: [id number]  HPI  She is here for a physical;acute issues denied  She is on a heart healthy diet; she walks 2-3 miles 5 days a week, weather permitting. There are no associated cardiopulmonary symptoms with this  There is family history of colon cancer in her father. She believes her colonoscopy is overdue. She has no active GI symptoms.  She has been compliant with her thyroid supplementation without adverse effects.  Review of Systems  Intermittent localized joint pain in hands; no meds taken Pertinent negative or absent signs and symptoms are as follows: Constitutional: No significant change in weight; significant fatigue; sleep disorder; change in appetite. Eye: no blurred, double ,loss of vision Cardiovascular: no palpitations; racing; irregularity;exertional dyspnea or chest pain ENT/GI: no constipation; diarrhea;hoarseness;dysphagia;melena; rectal bleeding or small caliber stools Derm: no change in nails,hair,skin Neuro: no numbness or tingling; tremor Psych:no anxiety; depression; panic attacks Endo: no temperature intolerance to heat ,cold     Objective:   Physical Exam Gen.: Adequately nourished in appearance. Alert, appropriate and cooperative throughout exam.  Appears younger than stated age  Head: Normocephalic without obvious abnormalities  Eyes: No corneal or conjunctival inflammation noted. Pupils equal round reactive to light and accommodation. Extraocular motion intact.  Ears: External  ear exam reveals no significant lesions or deformities.  Hearing aids bilaterally. Nose: External nasal exam reveals no deformity or inflammation. Nasal mucosa are pink and moist. No lesions or exudates noted.   Mouth: Oral mucosa and oropharynx reveal no lesions or exudates. Teeth in good repair. Neck: No deformities, masses, or tenderness noted. Range of motion & Thyroid normal. Lungs:  Normal respiratory effort; chest expands symmetrically. Lungs are clear to auscultation without rales, wheezes, or increased work of breathing. Heart: Normal rate and rhythm. Normal S1 and S2. No gallop, click, or rub. No murmur. Abdomen: Bowel sounds normal; abdomen soft and nontender. No masses, organomegaly or hernias noted. Genitalia: as per Gyn                                  Musculoskeletal/extremities: No deformity or scoliosis noted of  the thoracic or lumbar spine.  No clubbing, cyanosis, edema, or significant extremity  deformity noted.  Range of motion normal . Tone & strength normal. Hand joints normal  Fingernail  health good. Minor crepitus of knees  Able to lie down & sit up w/o help.  Negative SLR bilaterally Vascular: Carotid, radial artery, dorsalis pedis and  posterior tibial pulses are full and equal. No bruits present. Neurologic: Alert and oriented x3. Deep tendon reflexes symmetrical and normal.  Gait normal     Skin: Intact without suspicious lesions or rashes. Lymph: No cervical, axillary lymphadenopathy present. Psych: Mood and affect are normal. Normally interactive                                                                                       Assessment & Plan:  #1 comprehensive physical exam; no acute findings  Plan:  see Orders  & Recommendations

## 2014-12-24 NOTE — Patient Instructions (Addendum)
  Your next office appointment will be determined based upon review of your pending labs . Those instructions will be transmitted to you through My Chart  Use an anti-inflammatory cream such as Aspercreme or Zostrix cream twice a day to the affected area as needed. In lieu of this warm moist compresses or  hot water bottle can be used. Do not apply ice . Followup as needed for any active or acute issue. Please report any significant change in your symptoms.

## 2014-12-24 NOTE — Progress Notes (Signed)
Pre visit review using our clinic review tool, if applicable. No additional management support is needed unless otherwise documented below in the visit note. 

## 2015-05-26 ENCOUNTER — Other Ambulatory Visit: Payer: Self-pay

## 2015-05-26 MED ORDER — BUTALBITAL-ASPIRIN-CAFFEINE 50-325-40 MG PO TABS: ORAL_TABLET | ORAL | Status: DC

## 2015-05-26 NOTE — Telephone Encounter (Signed)
Rx called in to pharmacy. 

## 2015-09-01 ENCOUNTER — Encounter: Payer: Self-pay | Admitting: Gastroenterology

## 2015-09-09 ENCOUNTER — Ambulatory Visit (INDEPENDENT_AMBULATORY_CARE_PROVIDER_SITE_OTHER): Payer: Managed Care, Other (non HMO) | Admitting: Gynecology

## 2015-09-09 ENCOUNTER — Encounter: Payer: Self-pay | Admitting: Gynecology

## 2015-09-09 VITALS — BP 120/70 | Ht 65.0 in | Wt 147.0 lb

## 2015-09-09 DIAGNOSIS — Z01419 Encounter for gynecological examination (general) (routine) without abnormal findings: Secondary | ICD-10-CM | POA: Diagnosis not present

## 2015-09-09 DIAGNOSIS — Z79890 Hormone replacement therapy (postmenopausal): Secondary | ICD-10-CM

## 2015-09-09 DIAGNOSIS — Z23 Encounter for immunization: Secondary | ICD-10-CM

## 2015-09-09 DIAGNOSIS — N952 Postmenopausal atrophic vaginitis: Secondary | ICD-10-CM

## 2015-09-09 MED ORDER — IBUPROFEN 800 MG PO TABS: 800.0000 mg | ORAL_TABLET | Freq: Three times a day (TID) | ORAL | Status: DC | PRN

## 2015-09-09 MED ORDER — BUTALBITAL-ASPIRIN-CAFFEINE 50-325-40 MG PO TABS: ORAL_TABLET | ORAL | Status: DC

## 2015-09-09 NOTE — Progress Notes (Signed)
Kristen [name] Oct 15, 1951 [id number]        64 y.o.  G2P2001  No LMP recorded. Patient is postmenopausal. for annual exam.  Several issues noted below.  Past medical history,surgical history, problem list, medications, allergies, family history and social history were all reviewed and documented as reviewed in the EPIC chart.  ROS:  Performed with pertinent positives and negatives included in the history, assessment and plan.   Additional significant findings :  none   Exam: Kim Counsellor Vitals:   09/09/15 1516  BP: 120/70  Height: 5\' 5"  (1.651 m)  Weight: 147 lb (66.679 kg)   General appearance:  Normal affect, orientation and appearance. Skin: Grossly normal HEENT: Without gross lesions.  No cervical or supraclavicular adenopathy. Thyroid normal.  Lungs:  Clear without wheezing, rales or rhonchi Cardiac: RR, without RMG Abdominal:  Soft, nontender, without masses, guarding, rebound, organomegaly or hernia Breasts:  Examined lying and sitting without masses, retractions, discharge or axillary adenopathy. Pelvic:  Ext/BUS/vagina with atrophic changes  Cervix with atrophic changes  Uterus anteverted, normal size, shape and contour, midline and mobile nontender   Adnexa  Without masses or tenderness    Anus and perineum  Normal   Rectovaginal  Normal sphincter tone without palpated masses or tenderness.    Assessment/Plan:  64 y.o. G88P2001 female for annual exam.   1. Postmenopausal/atrophic genital changes/HRT.  The patient is on a bioidentical HRT regimen to initially started by Dr. Sharol Roussel and continued by Dr. Cherylann Banas. She uses estradiol  .15 mg per ml cream day 1 to 5. She then uses estradiol 0.15 mg cream(78 gram) with 10% progesterone cream daily for the rest of the month.  He had discussed the risks and the lack of scientific studies supporting bioidentical hormone use. I rediscussed the whole issue of bioidentical hormones with her, lack of evidence to document  specific risks with different regimens. I again today reviewed the HRT issue and the lack of evidence with identical hormones. Increased risk of stroke heart attack DVT and breast cancer with standard regimens reviewed. Patient has done no bleeding she is comfortable continuing this and I refilled her 1 year.  Not having any significant hot flushes, night sweats, vaginal dryness. She knows to call if she does any vaginal bleeding. 2. History of migraine headaches occasionally. Uses Fiorinol when they occur. #30 with 1 refill provided. 3. 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 Recommend trowels of heat and anti-inflammatory. Ibuprofen 800 mg #60 with 3 refills provided. If continues or worsens and will consider referring to rheumatology. 4. Mammography due now on patient knows to schedule. SBE monthly reviewed. 5. Colonoscopy due now at 5 year interval and she knows to schedule and she agrees to call to do so. 6. DEXA 2009 normal. Plan repeat at age 52. Increased calcium vitamin D reviewed. 7. Pap smear 2014. No Pap smear done today. Plan repeat Pap smear next year a three-year interval. No history of significant abnormal Pap smears. 8. Health maintenance. No routine lab work done as this is reportedly done at her primary physician's office. Follow up in one year, sooner as needed.    [name] MD, 3:44 PM 09/09/2015

## 2015-09-09 NOTE — Addendum Note (Signed)
Addended by: Nelva Nay on: 09/09/2015 04:46 PM   Modules accepted: Orders

## 2015-09-09 NOTE — Patient Instructions (Addendum)

## 2015-09-10 ENCOUNTER — Telehealth: Payer: Self-pay | Admitting: *Deleted

## 2015-09-10 MED ORDER — AMBULATORY NON FORMULARY MEDICATION: Status: DC

## 2015-09-10 NOTE — Telephone Encounter (Signed)
Rx called in 

## 2015-09-10 NOTE — Telephone Encounter (Signed)
-----   Message from [name], MD sent at 09/09/2015  3:52 PM EDT ----- Call in refill for patients HRT regiment at [facility], [place] - [address]

## 2015-09-23 ENCOUNTER — Encounter: Payer: Self-pay | Admitting: Gynecology

## 2015-10-01 ENCOUNTER — Encounter: Payer: Self-pay | Admitting: Internal Medicine

## 2015-10-26 ENCOUNTER — Telehealth: Payer: Self-pay | Admitting: Internal Medicine

## 2015-10-26 NOTE — Telephone Encounter (Signed)
Ok to establish 

## 2015-10-26 NOTE — Telephone Encounter (Signed)
Dr. Linna Darner is retiring and patient would like to establish care with Dr. Birdie Riddle, patient is aware MD is relocating. Please advise

## 2015-11-26 ENCOUNTER — Encounter: Payer: Self-pay | Admitting: Gastroenterology

## 2015-12-28 ENCOUNTER — Telehealth: Payer: Self-pay | Admitting: *Deleted

## 2015-12-28 NOTE — Telephone Encounter (Signed)
Unable to reach patient at time of pre-visit call. Phone rang w/o answer, no voicemail.

## 2015-12-29 ENCOUNTER — Encounter: Payer: Self-pay | Admitting: Family Medicine

## 2015-12-29 ENCOUNTER — Ambulatory Visit (INDEPENDENT_AMBULATORY_CARE_PROVIDER_SITE_OTHER): Payer: Managed Care, Other (non HMO) | Admitting: Family Medicine

## 2015-12-29 VITALS — BP 120/82 | HR 74 | Temp 98.2°F | Ht 65.0 in | Wt 150.8 lb

## 2015-12-29 DIAGNOSIS — Z0000 Encounter for general adult medical examination without abnormal findings: Secondary | ICD-10-CM | POA: Diagnosis not present

## 2015-12-29 LAB — BASIC METABOLIC PANEL
BUN: 22 mg/dL (ref 6–23)
CO2: 30 mEq/L (ref 19–32)
Calcium: 9.2 mg/dL (ref 8.4–10.5)
Chloride: 106 mEq/L (ref 96–112)
Creatinine, Ser: 0.86 mg/dL (ref 0.40–1.20)
GFR: 70.65 mL/min (ref >60.00)
GLUCOSE: 84 mg/dL (ref 70–99)
POTASSIUM: 3.7 meq/L (ref 3.5–5.1)
Sodium: 142 mEq/L (ref 135–145)

## 2015-12-29 LAB — CBC WITH DIFFERENTIAL/PLATELET
BASOS ABS: 0 10*3/uL (ref 0.0–0.1)
Basophils Relative: 0.4 % (ref 0.0–3.0)
EOS ABS: 0.1 10*3/uL (ref 0.0–0.7)
Eosinophils Relative: 1 % (ref 0.0–5.0)
HCT: 42.9 % (ref 36.0–46.0)
Hemoglobin: 15 g/dL (ref 12.0–15.0)
LYMPHS ABS: 1.7 10*3/uL (ref 0.7–4.0)
Lymphocytes Relative: 23.8 % (ref 12.0–46.0)
MCHC: 34.9 g/dL (ref 30.0–36.0)
MCV: 93.4 fl (ref 78.0–100.0)
MONO ABS: 0.8 10*3/uL (ref 0.1–1.0)
MONOS PCT: 10.7 % (ref 3.0–12.0)
NEUTROS ABS: 4.7 10*3/uL (ref 1.4–7.7)
NEUTROS PCT: 64.1 % (ref 43.0–77.0)
PLATELETS: 197 10*3/uL (ref 150.0–400.0)
RBC: 4.59 Mil/uL (ref 3.87–5.11)
RDW: 12.1 % (ref 11.5–15.5)
WBC: 7.3 10*3/uL (ref 4.0–10.5)

## 2015-12-29 LAB — HEPATIC FUNCTION PANEL
ALBUMIN: 4.5 g/dL (ref 3.5–5.2)
ALK PHOS: 49 U/L (ref 39–117)
ALT: 17 U/L (ref 0–35)
AST: 18 U/L (ref 0–37)
BILIRUBIN DIRECT: 0.1 mg/dL (ref 0.0–0.3)
TOTAL PROTEIN: 6.9 g/dL (ref 6.0–8.3)
Total Bilirubin: 1 mg/dL (ref 0.2–1.2)

## 2015-12-29 LAB — VITAMIN D 25 HYDROXY (VIT D DEFICIENCY, FRACTURES): VITD: 46.11 ng/mL (ref 30.00–100.00)

## 2015-12-29 LAB — LIPID PANEL
CHOL/HDL RATIO: 4
Cholesterol: 224 mg/dL — ABNORMAL HIGH (ref 0–200)
HDL: 56.4 mg/dL (ref >39.00)
NonHDL: 167.99
Triglycerides: 309 mg/dL — ABNORMAL HIGH (ref 0.0–149.0)
VLDL: 61.8 mg/dL — ABNORMAL HIGH (ref 0.0–40.0)

## 2015-12-29 LAB — LDL CHOLESTEROL, DIRECT: LDL DIRECT: 135 mg/dL

## 2015-12-29 LAB — TSH: TSH: 0.84 u[IU]/mL (ref 0.35–4.50)

## 2015-12-29 MED ORDER — AMBULATORY NON FORMULARY MEDICATION
Status: DC
Start: 2015-12-29 — End: 2016-07-10

## 2015-12-29 NOTE — Patient Instructions (Signed)
Follow up in 1 year or as needed We'll notify you of your lab results and make any changes if needed Keep up the good work!  You look great! Call with any questions or concerns If you want to join [place] at the new [place] office, any scheduled appointments will automatically transfer and we will see you at [address] [place] [address], [place], [place] [address] ([place]) Welcome!  We're glad to have you!!

## 2015-12-29 NOTE — Assessment & Plan Note (Signed)
Pt's PE WNL.  UTD on GYN.  Has colonoscopy scheduled.  Check labs.  Anticipatory guidance provided.

## 2015-12-29 NOTE — Progress Notes (Signed)
   Subjective:    Patient ID: Kristen [name], female    DOB: 03-Jun-1951, 65 y.o.   MRN: [id number]  HPI CPE- UTD on GYN w/ Dr Phineas Real, scheduled for 3/14 w/ Dr Havery Moros.  Walking regularly.   Review of Systems Patient reports no vision/ hearing changes, adenopathy,fever, weight change,  persistant/recurrent hoarseness , swallowing issues, chest pain, palpitations, edema, persistant/recurrent cough, hemoptysis, dyspnea (rest/exertional/paroxysmal nocturnal), gastrointestinal bleeding (melena, rectal bleeding), abdominal pain, significant heartburn, bowel changes, GU symptoms (dysuria, hematuria, incontinence), Gyn symptoms (abnormal  bleeding, pain),  syncope, focal weakness, memory loss, numbness & tingling, skin/hair/nail changes, abnormal bruising or bleeding, anxiety, or depression.     Objective:   Physical Exam General Appearance:    Alert, cooperative, no distress, appears stated age  Head:    Normocephalic, without obvious abnormality, atraumatic  Eyes:    PERRL, conjunctiva/corneas clear, EOM's intact, fundi    benign, both eyes  Ears:    Normal TM's and external ear canals, both ears  Nose:   Nares normal, septum midline, mucosa normal, no drainage    or sinus tenderness  Throat:   Lips, mucosa, and tongue normal; teeth and gums normal  Neck:   Supple, symmetrical, trachea midline, no adenopathy;    Thyroid: no enlargement/tenderness/nodules  Back:     Symmetric, no curvature, ROM normal, no CVA tenderness  Lungs:     Clear to auscultation bilaterally, respirations unlabored  Chest Wall:    No tenderness or deformity   Heart:    Regular rate and rhythm, S1 and S2 normal, no murmur, rub   or gallop  Breast Exam:    Deferred to GYN  Abdomen:     Soft, non-tender, bowel sounds active all four quadrants,    no masses, no organomegaly  Genitalia:    Deferred to GYN  Rectal:    Extremities:   Extremities normal, atraumatic, no cyanosis or edema  Pulses:   2+ and symmetric  all extremities  Skin:   Skin color, texture, turgor normal, no rashes or lesions  Lymph nodes:   Cervical, supraclavicular, and axillary nodes normal  Neurologic:   CNII-XII intact, normal strength, sensation and reflexes    throughout          Assessment & Plan:

## 2015-12-29 NOTE — Progress Notes (Signed)
Pre visit review using our clinic review tool, if applicable. No additional management support is needed unless otherwise documented below in the visit note. 

## 2015-12-30 ENCOUNTER — Other Ambulatory Visit: Payer: Self-pay | Admitting: Family Medicine

## 2015-12-30 DIAGNOSIS — E785 Hyperlipidemia, unspecified: Secondary | ICD-10-CM | POA: Insufficient documentation

## 2016-01-11 ENCOUNTER — Ambulatory Visit (AMBULATORY_SURGERY_CENTER): Payer: Self-pay

## 2016-01-11 VITALS — Ht 65.0 in | Wt 150.2 lb

## 2016-01-11 DIAGNOSIS — Z800 Family history of malignant neoplasm of digestive organs: Secondary | ICD-10-CM

## 2016-01-11 MED ORDER — NA SULFATE-K SULFATE-MG SULF 17.5-3.13-1.6 GM/177ML PO SOLN: ORAL | Status: DC

## 2016-01-11 NOTE — Progress Notes (Signed)
Per pt, no allergies to soy or egg products.Pt not taking any weight loss meds or using  O2 at home. 

## 2016-01-12 HISTORY — PX: COLONOSCOPY WITH PROPOFOL: SHX5780

## 2016-01-25 ENCOUNTER — Ambulatory Visit (AMBULATORY_SURGERY_CENTER): Payer: Managed Care, Other (non HMO) | Admitting: Gastroenterology

## 2016-01-25 ENCOUNTER — Encounter: Payer: Self-pay | Admitting: Gastroenterology

## 2016-01-25 VITALS — BP 144/56 | HR 55 | Temp 98.9°F | Resp 11 | Ht 65.0 in | Wt 150.0 lb

## 2016-01-25 DIAGNOSIS — D125 Benign neoplasm of sigmoid colon: Secondary | ICD-10-CM | POA: Diagnosis not present

## 2016-01-25 DIAGNOSIS — Z1211 Encounter for screening for malignant neoplasm of colon: Secondary | ICD-10-CM | POA: Diagnosis not present

## 2016-01-25 DIAGNOSIS — D120 Benign neoplasm of cecum: Secondary | ICD-10-CM | POA: Diagnosis not present

## 2016-01-25 DIAGNOSIS — Z800 Family history of malignant neoplasm of digestive organs: Secondary | ICD-10-CM

## 2016-01-25 MED ORDER — SODIUM CHLORIDE 0.9 % IV SOLN: 500.0000 mL | INTRAVENOUS | Status: DC

## 2016-01-25 NOTE — Op Note (Signed)
[facility] Patient Name: Kristen [name] Procedure Date: 01/25/2016 11:23 AM MRN: [id number] Endoscopist: Viviann Spare P. Adela Lank , MD Age: 65 Referring MD:  Date of Birth: 11-22-1950 Gender: Female Procedure:                Colonoscopy Indications:              Screening patient at increased risk: Family history                            of 1st-degree relative with colorectal cancer at                            age 56 years (or older) Medicines:                Monitored Anesthesia Care Procedure:                Pre-Anesthesia Assessment:                           - Prior to the procedure, a History and Physical                            was performed, and patient medications and                            allergies were reviewed. The patient's tolerance of                            previous anesthesia was also reviewed. The risks                            and benefits of the procedure and the sedation                            options and risks were discussed with the patient.                            All questions were answered, and informed consent                            was obtained. Prior Anticoagulants: The patient has                            taken no previous anticoagulant or antiplatelet                            agents. ASA Grade Assessment: II - A patient with                            mild systemic disease. After reviewing the risks                            and benefits, the patient was deemed in  satisfactory condition to undergo the procedure.                           After obtaining informed consent, the colonoscope                            was passed under direct vision. Throughout the                            procedure, the patient's blood pressure, pulse, and                            oxygen saturations were monitored continuously. The                            Model PCF-H190L [id number]) scope was  introduced                            through the anus and advanced to the the cecum,                            identified by appendiceal orifice and ileocecal                            valve. The quality of the bowel preparation was                            good. The ileocecal valve, appendiceal orifice, and                            rectum were photographed. Scope In: 11:25:09 AM Scope Out: 11:44:49 AM Scope Withdrawal Time: 0 hours 17 minutes 20 seconds  Total Procedure Duration: 0 hours 19 minutes 40 seconds  Findings:      The perianal and digital rectal examinations were normal.      A 4 mm polyp was found in the cecum. The polyp was sessile. The polyp       was removed with a cold biopsy forceps. Resection and retrieval were       complete. Estimated blood loss was minimal.      A 4 mm polyp was found in the sigmoid colon. The polyp was sessile. The       polyp was removed with a cold snare. Resection and retrieval were       complete.      A diminutive polyp was found in the sigmoid colon. The polyp was       sessile. The polyp was removed with a cold biopsy forceps. Resection and       retrieval were complete.      The entire examined colon appeared normal on direct and retroflexion       views. Complications:            No immediate complications. Estimated blood loss:                            Minimal. Estimated Blood Loss:     Estimated blood loss was minimal.  Impression:               - One 4 mm polyp in the cecum, removed with a cold                            biopsy forceps. Resected and retrieved.                           - One 4 mm polyp in the sigmoid colon, removed with                            a cold snare. Resected and retrieved.                           - One diminutive polyp in the sigmoid colon,                            removed with a cold biopsy forceps. Resected and                            retrieved.                           - The entire  examined colon is normal on direct and                            retroflexion views. Recommendation:           - Patient has a contact number available for                            emergencies. The signs and symptoms of potential                            delayed complications were discussed with the                            patient. Return to normal activities tomorrow.                            Written discharge instructions were provided to the                            patient.                           - Resume previous diet.                           - Continue present medications.                           - Await pathology results.                           - Repeat colonoscopy for surveillance based on  pathology results. Procedure Code(s):        --- Professional ---                           [id number], Colonoscopy, flexible; with removal of                            tumor(s), polyp(s), or other lesion(s) by snare                            technique                           45380, 59, Colonoscopy, flexible; with biopsy,                            single or multiple CPT copyright 2016 [employer] Medical Association. All rights reserved. Viviann Spare P. Adela Lank, MD Willaim Rayas. Saje Gallop, MD 01/25/2016 11:48:45 AM This report has been signed electronically. Number of Addenda: 0

## 2016-01-25 NOTE — Progress Notes (Signed)
Report to PACU, RN, vss, BBS= Clear.  

## 2016-01-25 NOTE — Patient Instructions (Signed)
Discharge instructions given. Handout on polyps. Resume previous medications. YOU HAD AN ENDOSCOPIC PROCEDURE TODAY AT THE [facility]:   Refer to the procedure report that was given to you for any specific questions about what was found during the examination.  If the procedure report does not answer your questions, please call your gastroenterologist to clarify.  If you requested that your care partner not be given the details of your procedure findings, then the procedure report has been included in a sealed envelope for you to review at your convenience later.  YOU SHOULD EXPECT: Some feelings of bloating in the abdomen. Passage of more gas than usual.  Walking can help get rid of the air that was put into your GI tract during the procedure and reduce the bloating. If you had a lower endoscopy (such as a colonoscopy or flexible sigmoidoscopy) you may notice spotting of blood in your stool or on the toilet paper. If you underwent a bowel prep for your procedure, you may not have a normal bowel movement for a few days.  Please Note:  You might notice some irritation and congestion in your nose or some drainage.  This is from the oxygen used during your procedure.  There is no need for concern and it should clear up in a day or so.  SYMPTOMS TO REPORT IMMEDIATELY:   Following lower endoscopy (colonoscopy or flexible sigmoidoscopy):  Excessive amounts of blood in the stool  Significant tenderness or worsening of abdominal pains  Swelling of the abdomen that is new, acute  Fever of 100F or higher   For urgent or emergent issues, a gastroenterologist can be reached at any hour by calling [phone].   DIET: Your first meal following the procedure should be a small meal and then it is ok to progress to your normal diet. Heavy or fried foods are harder to digest and may make you feel nauseous or bloated.  Likewise, meals heavy in dairy and vegetables can increase bloating.  Drink  plenty of fluids but you should avoid alcoholic beverages for 24 hours.  ACTIVITY:  You should plan to take it easy for the rest of today and you should NOT DRIVE or use heavy machinery until tomorrow (because of the sedation medicines used during the test).    FOLLOW UP: Our staff will call the number listed on your records the next business day following your procedure to check on you and address any questions or concerns that you may have regarding the information given to you following your procedure. If we do not reach you, we will leave a message.  However, if you are feeling well and you are not experiencing any problems, there is no need to return our call.  We will assume that you have returned to your regular daily activities without incident.  If any biopsies were taken you will be contacted by phone or by letter within the next 1-3 weeks.  Please call us at [phone] if you have not heard about the biopsies in 3 weeks.    SIGNATURES/CONFIDENTIALITY: You and/or your care partner have signed paperwork which will be entered into your electronic medical record.  These signatures attest to the fact that that the information above on your After Visit Summary has been reviewed and is understood.  Full responsibility of the confidentiality of this discharge information lies with you and/or your care-partner. 

## 2016-01-25 NOTE — Progress Notes (Signed)
Called to room to assist during endoscopic procedure.  Patient ID and intended procedure confirmed with present staff. Received instructions for my participation in the procedure from the performing physician.  

## 2016-01-26 ENCOUNTER — Telehealth: Payer: Self-pay | Admitting: *Deleted

## 2016-01-26 NOTE — Telephone Encounter (Signed)
  Follow up Call-  Call back number 01/25/2016  Post procedure Call Back phone  # [phone]  Permission to leave phone message Yes     Patient questions:  Do you have a fever, pain , or abdominal swelling? No. Pain Score  0 *  Have you tolerated food without any problems? Yes.    Have you been able to return to your normal activities? Yes.    Do you have any questions about your discharge instructions: Diet   No. Medications  No. Follow up visit  No.  Do you have questions or concerns about your Care? No.  Actions: * If pain score is 4 or above: No action needed, pain <4.

## 2016-02-03 ENCOUNTER — Encounter: Payer: Self-pay | Admitting: Gastroenterology

## 2016-02-28 ENCOUNTER — Ambulatory Visit (INDEPENDENT_AMBULATORY_CARE_PROVIDER_SITE_OTHER): Payer: Managed Care, Other (non HMO)

## 2016-02-28 ENCOUNTER — Ambulatory Visit: Payer: Self-pay

## 2016-02-28 ENCOUNTER — Encounter: Payer: Self-pay | Admitting: Podiatry

## 2016-02-28 ENCOUNTER — Ambulatory Visit (INDEPENDENT_AMBULATORY_CARE_PROVIDER_SITE_OTHER): Payer: Managed Care, Other (non HMO) | Admitting: Podiatry

## 2016-02-28 VITALS — BP 108/75 | HR 72 | Resp 16 | Ht 65.0 in | Wt 144.0 lb

## 2016-02-28 DIAGNOSIS — R52 Pain, unspecified: Secondary | ICD-10-CM

## 2016-02-28 DIAGNOSIS — M779 Enthesopathy, unspecified: Secondary | ICD-10-CM

## 2016-02-28 DIAGNOSIS — L600 Ingrowing nail: Secondary | ICD-10-CM | POA: Diagnosis not present

## 2016-02-28 DIAGNOSIS — M79675 Pain in left toe(s): Secondary | ICD-10-CM

## 2016-02-28 NOTE — Patient Instructions (Signed)

## 2016-02-28 NOTE — Progress Notes (Signed)
   Subjective:    Patient ID: Kristen [name], female    DOB: 1951-05-28, 65 y.o.   MRN: [id number]  HPI Patient presents with a nail problem on their left foot; great toe-medial. Pt stated, "Does not hurt today; does want all nails checked for nail fungus"; x4-6 weeks.  Review of Systems  HENT: Positive for hearing loss.   All other systems reviewed and are negative.      Objective:   Physical Exam        Assessment & Plan:

## 2016-03-01 NOTE — Progress Notes (Signed)
Subjective:     Patient ID: Kristen [name], female   DOB: 10/10/1951, 65 y.o.   MRN: [id number]  HPI patient presents with a painful nail of the left hallux medial border and also history of pain in the big toe joints bilateral. Worse when walking or golf   Review of Systems  All other systems reviewed and are negative.      Objective:   Physical Exam  Constitutional: She is oriented to person, place, and time.  Cardiovascular: Intact distal pulses.   Musculoskeletal: Normal range of motion.  Neurological: She is oriented to person, place, and time.  Skin: Skin is warm.  Nursing note and vitals reviewed.  neurovascular status intact muscle strength adequate range of motion within normal limits with patient found to have a painful left hallux medial border that's incurvated and irritated when palpated along with discomfort of the big toe joints with narrowness the joint and history of having surgery on the right big toe joint with removal of dorsal bone spurs. Patient's found have good digital perfusion is well oriented 3     Assessment:     Ingrown toenail deformity left hallux along with hallux limitus deformity bilateral    Plan:     H&P and condition reviewed with patient. I've recommended removal of the ingrown toenail deformity and she wants this done and I explained procedure to her and risk and today infiltrated the left hallux 60 mg like Marcaine mixture remove the border exposed matrix and applied phenol 3 applications 30 seconds. I then went ahead and I scanned for orthotics to try to support the first metatarsal phalangeal joint of both feet

## 2016-03-06 ENCOUNTER — Telehealth: Payer: Self-pay | Admitting: *Deleted

## 2016-03-06 NOTE — Telephone Encounter (Signed)
Called patient at [phone] (Home #) to check to see how they were doing from their ingrown toenail procedure that was performed on Monday, February 28, 2016. I was not able to leave a message because voicemail said, "enter your remote access code".

## 2016-03-29 ENCOUNTER — Ambulatory Visit: Payer: Managed Care, Other (non HMO) | Admitting: *Deleted

## 2016-03-29 DIAGNOSIS — R52 Pain, unspecified: Secondary | ICD-10-CM

## 2016-03-29 NOTE — Progress Notes (Signed)
Patient ID: Kristen [name], female   DOB: [birth year], 65 y.o.   MRN: [id number] Patient presents for orthotic pick up.  Verbal and written break in and wear instructions given.  Patient will follow up in 4 weeks if symptoms worsen or fail to improve.

## 2016-03-29 NOTE — Patient Instructions (Signed)

## 2016-05-22 ENCOUNTER — Other Ambulatory Visit: Payer: Self-pay | Admitting: Gynecology

## 2016-05-22 NOTE — Telephone Encounter (Signed)
Called into pharmacy

## 2016-07-10 ENCOUNTER — Other Ambulatory Visit: Payer: Self-pay | Admitting: General Practice

## 2016-07-10 MED ORDER — AMBULATORY NON FORMULARY MEDICATION: 1 refills | Status: DC

## 2016-07-14 ENCOUNTER — Other Ambulatory Visit: Payer: Self-pay | Admitting: Gynecology

## 2016-08-12 ENCOUNTER — Ambulatory Visit (INDEPENDENT_AMBULATORY_CARE_PROVIDER_SITE_OTHER): Payer: Managed Care, Other (non HMO) | Admitting: Family Medicine

## 2016-08-12 ENCOUNTER — Encounter: Payer: Self-pay | Admitting: Family Medicine

## 2016-08-12 VITALS — BP 124/84 | HR 62 | Temp 98.7°F | Resp 16 | Wt 146.0 lb

## 2016-08-12 DIAGNOSIS — J189 Pneumonia, unspecified organism: Secondary | ICD-10-CM

## 2016-08-12 MED ORDER — AZITHROMYCIN 250 MG PO TABS: ORAL_TABLET | ORAL | 0 refills | Status: DC

## 2016-08-12 NOTE — Progress Notes (Signed)
Pre visit review using our clinic review tool, if applicable. No additional management support is needed unless otherwise documented below in the visit note. 

## 2016-08-12 NOTE — Progress Notes (Signed)
Dr. Karleen Hampshire T. Hope Holst, MD, CAQ [facility] [address] [place], [address] Phone: [phone] Fax: [phone]  08/12/2016  Patient: Kristen [name], MRN: [id number], DOB: 1951/07/02, 65 y.o.  Primary Physician:  Neena Rhymes, MD   Chief Complaint  Patient presents with  . Cough    some productive, low energy, denies, head or chest congestion, sneexing, sore throat. Cough started 2.5 weeks ago.    Subjective:   Kristen [name] is a 65 y.o. very pleasant female patient who presents with the following:  On a plane - ongoing for about two and a half weeks. Did fly. No fever. Has hung on without sore throat like a cold. Took some mucinex DM. Throat is deeper than normal. Overall.  She is not feeling all that bad, but she did feel somewhat short winded when trying to play golf this week.  She has had a steady cough.  Not significantly productive.  Mild sore throat and overall doesn't feel  Particularly well.    Past Medical History, Surgical History, Social History, Family History, Problem List, Medications, and Allergies have been reviewed and updated if relevant.  Patient Active Problem List   Diagnosis Date Noted  . Hyperlipidemia 12/30/2015  . Physical exam 12/29/2015  . Hypothyroid 09/23/2012  . Kidney stones, calcium oxalate     Past Medical History:  Diagnosis Date  . Arthritis    In hands  . Hearing aid worn    Bil  . History of shingles 2007   Shingles shot current  . Hypothyroid   . Kidney stones, calcium oxalate    Lithrotripsy x1; surgical removal X1  via cystoscopy  . Migraine    menopausal    Past Surgical History:  Procedure Laterality Date  . COLONOSCOPY     negative ; initially age 78; Dr Juanda Chance  . CYSTOSCOPY     X 2; Dr Aldean Ast  . FOOT SURGERY  2012   great toe   . LITHOTRIPSY     X1  . MOUTH SURGERY     extra bone removed in mouth 30 years ago  . TONSILLECTOMY AND ADENOIDECTOMY    .  WISDOM TOOTH EXTRACTION      Social History   Social History  . Marital status: Married    Spouse name: N/A  . Number of children: N/A  . Years of education: N/A   Occupational History  . Not on file.   Social History Main Topics  . Smoking status: Never Smoker  . Smokeless tobacco: Never Used  . Alcohol use 6.0 oz/week    10 Glasses of wine per week  . Drug use: No  . Sexual activity: Yes    Birth control/ protection: Post-menopausal     Comment: 1st intercourse 65 yo-1 partner   Other Topics Concern  . Not on file   Social History Narrative  . No narrative on file    Family History  Problem Relation Age of Onset  . Heart failure Paternal Grandmother   . Stroke Paternal Grandmother 58  . Dementia Mother   . COPD Mother   . Lung cancer Mother     smoker  . Cancer Father     Colon and Bladder cancer  . Diabetes Father   . Hypertension Father   . Colon cancer Father   . Cancer Paternal Grandfather     prostate/ colon  . Colon cancer Paternal Grandfather   . Melanoma Maternal Grandmother  Allergies  Allergen Reactions  . Gantrisin [Sulfisoxazole]     Urticaria with sun exposure   . Penicillins     REACTION: as child; ? hives  . Sulfa Antibiotics     Reaction not definite/ some hives    Medication list reviewed and updated in full in [facility].  ROS: GEN: Acute illness details above GI: Tolerating PO intake GU: maintaining adequate hydration and urination Pulm: No SOB Interactive and getting along well at home.  Otherwise, ROS is as per the HPI.  Objective:   BP 124/84   Pulse 62   Temp 98.7 F (37.1 C) (Oral)   Resp 16   Wt 146 lb (66.2 kg)   SpO2 97%   BMI 24.30 kg/m    GEN: A and O x 3. WDWN. NAD.    ENT: Nose clear, ext NML.  No LAD.  No JVD.  TM's clear. Oropharynx clear.  PULM: Normal WOB, no distress. No crackles, wheezes, rhonchi. CV: RRR, no M/G/R, No rubs, No JVD.   EXT: warm and well-perfused, No c/c/e. PSYCH:  Pleasant and conversant.    Laboratory and Imaging Data:  Assessment and Plan:   Walking pneumonia  Atypical pneumonia would be high in the differential.  Pertussis less likely.  Prolonged viral bronchitis also possible.  Follow-up: No Follow-up on file.  New Prescriptions   AZITHROMYCIN (ZITHROMAX) 250 MG TABLET    2 tabs po on day 1, then 1 tab po for 4 days   Signed,  Aubrina Nieman T. Kionte Baumgardner, MD   Patient's Medications  New Prescriptions   AZITHROMYCIN (ZITHROMAX) 250 MG TABLET    2 tabs po on day 1, then 1 tab po for 4 days  Previous Medications   AMBULATORY NON FORMULARY MEDICATION    Compounded Estradiol + Progesterone 0.15mg  + 10% cream  Apply 1gm daily D6-30 rotate sites Disp 1 tub of 78GM 78 day supply   AMBULATORY NON FORMULARY MEDICATION    Medication Name: T3/T4 12.5 mcg-25 mcg capsule. Take 1 capsule by mouth every morning   ASCORBIC ACID (VITAMIN C PO)    Take 1 tablet by mouth daily.   BUTALBITAL-ASPIRIN-CAFFEINE (FIORINAL) 50-325-40 MG CAPSULE    TAKE ONE CAPSULE BY MOUTH EVERY 6 HOURS AS NEEDED FOR HEADACHE   BUTALBITAL-ASPIRIN-CAFFEINE (FIORINAL) 50-325-40 MG TABLET    TAKE ONE TABLET EVERY SIX HOURS AS NEEDED FOR HEADACHE   CALCIUM CITRATE-VITAMIN D (CALCIUM CITRATE + PO)    Take by mouth. Two times daily    GLUCOSAMINE PO    Take 1 tablet by mouth 2 (two) times daily.   IBUPROFEN (ADVIL,MOTRIN) 800 MG TABLET    TAKE 1 TABLET BY MOUTH EVERY 8 HOURS AS NEEDED   MULTIPLE VITAMIN (MULTIVITAMIN) TABLET    Take 1 tablet by mouth daily. MinRX dietary supplement: 1 by mouth daily    NONFORMULARY OR COMPOUNDED ITEM    Estradiol 0.15mg /gm cream S: apply 1 ml topically 1-2 times daily on Days 1-5 (rotate sites)   OMEGA-3 FATTY ACIDS (FISH OIL) 1200 MG CAPS    Take by mouth 2 (two) times daily.    Modified Medications   No medications on file  Discontinued Medications   No medications on file

## 2016-08-31 ENCOUNTER — Encounter: Payer: Self-pay | Admitting: Family Medicine

## 2016-08-31 ENCOUNTER — Ambulatory Visit (INDEPENDENT_AMBULATORY_CARE_PROVIDER_SITE_OTHER): Payer: Managed Care, Other (non HMO) | Admitting: Family Medicine

## 2016-08-31 VITALS — BP 122/82 | HR 70 | Temp 98.2°F | Resp 17 | Ht 65.0 in | Wt 148.4 lb

## 2016-08-31 DIAGNOSIS — R05 Cough: Secondary | ICD-10-CM | POA: Diagnosis not present

## 2016-08-31 DIAGNOSIS — Z23 Encounter for immunization: Secondary | ICD-10-CM | POA: Diagnosis not present

## 2016-08-31 DIAGNOSIS — R058 Other specified cough: Secondary | ICD-10-CM

## 2016-08-31 MED ORDER — ALBUTEROL SULFATE HFA 108 (90 BASE) MCG/ACT IN AERS: 2.0000 | INHALATION_SPRAY | Freq: Four times a day (QID) | RESPIRATORY_TRACT | 2 refills | Status: DC | PRN

## 2016-08-31 MED ORDER — ALBUTEROL SULFATE (2.5 MG/3ML) 0.083% IN NEBU
2.5000 mg | INHALATION_SOLUTION | Freq: Once | RESPIRATORY_TRACT | Status: AC
  Administered 2016-08-31: 2.5 mg via RESPIRATORY_TRACT

## 2016-08-31 NOTE — Addendum Note (Signed)
Addended by: Davis Gourd on: 08/31/2016 08:57 AM   Modules accepted: Orders

## 2016-08-31 NOTE — Progress Notes (Signed)
   Subjective:    Patient ID: Kristen [name], female    DOB: 04/25/1951, 65 y.o.   MRN: [id number]  HPI Cough- pt was seen 9/30 by Dr L[facility] and dx'd w/ walking PNA, started on Zpack.  No fevers.  Pt reports 'I feel fine, I feel great.  I have my energy back'.  Pt reports cough persists and 'i'll get a get coughing jag'.  Sleeping well at night.     Review of Systems For ROS see HPI     Objective:   Physical Exam  Constitutional: She is oriented to person, place, and time. She appears well-developed and well-nourished. No distress.  HENT:  Head: Normocephalic and atraumatic.  TMs normal bilaterally Mild nasal congestion Throat w/out erythema, edema, or exudate  Eyes: Conjunctivae and EOM are normal. Pupils are equal, round, and reactive to light.  Neck: Normal range of motion. Neck supple.  Cardiovascular: Normal rate, regular rhythm, normal heart sounds and intact distal pulses.   No murmur heard. Pulmonary/Chest: Effort normal and breath sounds normal. No respiratory distress. She has no wheezes.  + dry cough  Lymphadenopathy:    She has no cervical adenopathy.  Neurological: She is alert and oriented to person, place, and time.  Skin: Skin is warm and dry.  Psychiatric: She has a normal mood and affect. Her behavior is normal. Thought content normal.          Assessment & Plan:  Post-infectious cough- new.  Pt is feeling well w/ exception of cough.  Albuterol tx in office today and HFA prescription for home use.  Pt to add evening ibuprofen to help w/ inflammation.  Cough meds prn.  Reviewed supportive care and red flags that should prompt return.  Pt expressed understanding and is in agreement w/ plan.

## 2016-08-31 NOTE — Patient Instructions (Signed)
Follow up as needed Use the Albuterol inhaler for coughing spells Delsym or other cough meds as needed Add 200mg  Ibuprofen (1 tab OTC) at dinner to help w/ airway inflammation Drink plenty of fluids Call with any questions or concerns Hang in there!!

## 2016-08-31 NOTE — Progress Notes (Signed)
Pre visit review using our clinic review tool, if applicable. No additional management support is needed unless otherwise documented below in the visit note. 

## 2016-09-11 ENCOUNTER — Encounter: Payer: Self-pay | Admitting: Gynecology

## 2016-09-11 ENCOUNTER — Telehealth: Payer: Self-pay | Admitting: *Deleted

## 2016-09-11 ENCOUNTER — Ambulatory Visit (INDEPENDENT_AMBULATORY_CARE_PROVIDER_SITE_OTHER): Payer: Managed Care, Other (non HMO) | Admitting: Gynecology

## 2016-09-11 VITALS — BP 120/76 | Ht 65.0 in | Wt 150.0 lb

## 2016-09-11 DIAGNOSIS — Z79890 Hormone replacement therapy (postmenopausal): Secondary | ICD-10-CM

## 2016-09-11 DIAGNOSIS — Z01419 Encounter for gynecological examination (general) (routine) without abnormal findings: Secondary | ICD-10-CM

## 2016-09-11 DIAGNOSIS — N952 Postmenopausal atrophic vaginitis: Secondary | ICD-10-CM

## 2016-09-11 MED ORDER — AMBULATORY NON FORMULARY MEDICATION: 3 refills | Status: DC

## 2016-09-11 MED ORDER — IBUPROFEN 800 MG PO TABS: 800.0000 mg | ORAL_TABLET | Freq: Three times a day (TID) | ORAL | 3 refills | Status: DC | PRN

## 2016-09-11 NOTE — Telephone Encounter (Signed)
Rx called in 

## 2016-09-11 NOTE — Progress Notes (Signed)
    Kristen [name] 21-Oct-1951 [id number]        65 y.o.  G2P2001  for annual exam.  Several issues noted below.  Past medical history,surgical history, problem list, medications, allergies, family history and social history were all reviewed and documented as reviewed in the EPIC chart.  ROS:  Performed with pertinent positives and negatives included in the history, assessment and plan.   Additional significant findings :  None   Exam: [name] assistant Vitals:   09/11/16 1358  BP: 120/76  Weight: 150 lb (68 kg)  Height: 5\' 5"  (1.651 m)   Body mass index is 24.96 kg/m.  General appearance:  Normal affect, orientation and appearance. Skin: Grossly normal HEENT: Without gross lesions.  No cervical or supraclavicular adenopathy. Thyroid normal.  Lungs:  Clear without wheezing, rales or rhonchi Cardiac: RR, without RMG Abdominal:  Soft, nontender, without masses, guarding, rebound, organomegaly or hernia Breasts:  Examined lying and sitting without masses, retractions, discharge or axillary adenopathy. Pelvic:  Ext, BUS, Vagina with atrophic changes  Cervix with atrophic changes. Pap smear done  Uterus anteverted, normal size, shape and contour, midline and mobile nontender   Adnexa without masses or tenderness    Anus and perineum normal   Rectovaginal normal sphincter tone without palpated masses or tenderness.    Assessment/Plan:  65 y.o. G69P2001 female for annual exam.   1. Postmenopausal/atrophic genital changes/HRT. Patient continues on the bioidentical HRT regiment initiated by Dr. Sharol Roussel and continue by Dr. Cherylann Banas. Uses estradiol 0.15 mg/cc days 1 through 5 and then estradiol 0.15 mg and 10% progesterone cream daily for the rest of month. She does not bleed with this regiment. I reviewed the most current 2017 NAMS guidelines on HRT. I discussed possible benefits to include symptom relief, cardiovascular and bone health if started early as well as risks to  include thrombosis such as stroke heart attack DVT and breast cancer. Less well studied bioidentical regiments with unknown long-term risks also discussed. Patients very comfortable with this regiment and prefers to continue for now. She understands and accepts the risks. Refill 1 year provided. Need to call if any vaginal bleeding stressed. 2. History of migraine headaches for which she uses Fiorinol occasionally. Has supply at home but may call if she needs more. 3. Arthritic relief with ibuprofen 800 mg every 8 hours. #60 with 3 refills provided. 4. Pap smear 2014. Pap smear done today. No history of abnormal Pap smears previously. 5. Mammography coming due in November and she knows to call and schedule. SBE monthly reviewed. 6. DEXA 2009 normal. Plan repeat DEXA next year at age 70. Increased calcium vitamin D. 7. Colonoscopy 2017. Repeat at their recommended interval. 8. Health maintenance. No routine lab work done as patient does this elsewhere. Follow up in one year, sooner as needed.  Greater than 10 minutes of my time in excess of her routine gynecologic exam was spent in direct face to face counseling and coordination of care in regards to her HRT, bioidentical hormone regiment risk versus benefit and review of latest 2017 HRT guidelines.    [name] MD, 2:36 PM 09/11/2016

## 2016-09-11 NOTE — Telephone Encounter (Signed)
-----   Message from [name], MD sent at 09/11/2016  2:34 PM EDT ----- Call in refill to [facility] for the patient's HRT to include the estradiol only cream and the estradiol/progesterone cream combination that she already has on file with them. Refill through 1 year

## 2016-09-11 NOTE — Addendum Note (Signed)
Addended by: Nelva Nay on: 09/11/2016 02:52 PM   Modules accepted: Orders

## 2016-09-13 LAB — PAP IG W/ RFLX HPV ASCU

## 2016-09-15 ENCOUNTER — Other Ambulatory Visit: Payer: Self-pay | Admitting: Gynecology

## 2016-09-15 ENCOUNTER — Telehealth: Payer: Self-pay

## 2016-09-15 MED ORDER — NONFORMULARY OR COMPOUNDED ITEM: 11 refills | Status: DC

## 2016-09-15 NOTE — Telephone Encounter (Signed)
Patient said that at visit we did not send her Rx in for her Fiorinal tabs for the year. She said she does not need it now but wants to have it on file at pharmacy. Will actually need to be called in. Rx?

## 2016-09-17 LAB — HM PAP SMEAR

## 2016-09-18 MED ORDER — BUTALBITAL-ASPIRIN-CAFFEINE 50-325-40 MG PO TABS: ORAL_TABLET | ORAL | 3 refills | Status: DC

## 2016-09-18 NOTE — Telephone Encounter (Signed)
Okay for #30 with 3 refills when needed.

## 2016-09-18 NOTE — Telephone Encounter (Signed)
Rx phoned in.   

## 2016-10-02 LAB — HM MAMMOGRAPHY

## 2016-10-10 ENCOUNTER — Encounter: Payer: Self-pay | Admitting: General Practice

## 2017-01-01 ENCOUNTER — Telehealth: Payer: Self-pay | Admitting: Family Medicine

## 2017-01-01 NOTE — Telephone Encounter (Signed)
Pt would need a physical. We cannot do any labs without seeing her. Please schedule this appointment.

## 2017-01-01 NOTE — Telephone Encounter (Signed)
Patient would like to know if she is able just come in and get blood work done or needs to schedule an appointment. Does she need to have a physical done?  Please call patient back at [phone] to advise or call [phone].  Thank you

## 2017-01-01 NOTE — Telephone Encounter (Signed)
Patient is scheduled the 21st at 8am. Thank you

## 2017-01-03 ENCOUNTER — Ambulatory Visit (INDEPENDENT_AMBULATORY_CARE_PROVIDER_SITE_OTHER): Payer: Managed Care, Other (non HMO) | Admitting: Family Medicine

## 2017-01-03 ENCOUNTER — Encounter: Payer: Self-pay | Admitting: Family Medicine

## 2017-01-03 VITALS — BP 121/81 | HR 61 | Temp 98.1°F | Resp 17 | Ht 65.0 in | Wt 146.1 lb

## 2017-01-03 DIAGNOSIS — Z0000 Encounter for general adult medical examination without abnormal findings: Secondary | ICD-10-CM | POA: Diagnosis not present

## 2017-01-03 LAB — BASIC METABOLIC PANEL
BUN: 21 mg/dL (ref 6–23)
CO2: 28 mEq/L (ref 19–32)
CREATININE: 0.67 mg/dL (ref 0.40–1.20)
Calcium: 9.2 mg/dL (ref 8.4–10.5)
Chloride: 106 mEq/L (ref 96–112)
GFR: 93.94 mL/min (ref >60.00)
GLUCOSE: 80 mg/dL (ref 70–99)
Potassium: 4.2 mEq/L (ref 3.5–5.1)
Sodium: 139 mEq/L (ref 135–145)

## 2017-01-03 LAB — HEPATIC FUNCTION PANEL
ALBUMIN: 4.4 g/dL (ref 3.5–5.2)
ALT: 16 U/L (ref 0–35)
AST: 18 U/L (ref 0–37)
Alkaline Phosphatase: 55 U/L (ref 39–117)
Bilirubin, Direct: 0.2 mg/dL (ref 0.0–0.3)
TOTAL PROTEIN: 6.2 g/dL (ref 6.0–8.3)
Total Bilirubin: 1.6 mg/dL — ABNORMAL HIGH (ref 0.2–1.2)

## 2017-01-03 LAB — CBC WITH DIFFERENTIAL/PLATELET
BASOS ABS: 0.1 10*3/uL (ref 0.0–0.1)
Basophils Relative: 1.2 % (ref 0.0–3.0)
EOS ABS: 0.1 10*3/uL (ref 0.0–0.7)
Eosinophils Relative: 1.7 % (ref 0.0–5.0)
HEMATOCRIT: 41.7 % (ref 36.0–46.0)
Hemoglobin: 14.4 g/dL (ref 12.0–15.0)
LYMPHS ABS: 1.3 10*3/uL (ref 0.7–4.0)
LYMPHS PCT: 23.9 % (ref 12.0–46.0)
MCHC: 34.4 g/dL (ref 30.0–36.0)
MCV: 94.9 fl (ref 78.0–100.0)
MONOS PCT: 11.7 % (ref 3.0–12.0)
Monocytes Absolute: 0.6 10*3/uL (ref 0.1–1.0)
NEUTROS PCT: 61.5 % (ref 43.0–77.0)
Neutro Abs: 3.3 10*3/uL (ref 1.4–7.7)
PLATELETS: 178 10*3/uL (ref 150.0–400.0)
RBC: 4.4 Mil/uL (ref 3.87–5.11)
RDW: 12.6 % (ref 11.5–15.5)
WBC: 5.3 10*3/uL (ref 4.0–10.5)

## 2017-01-03 LAB — LIPID PANEL
CHOL/HDL RATIO: 4
CHOLESTEROL: 223 mg/dL — AB (ref 0–200)
HDL: 57 mg/dL (ref >39.00)
LDL CALC: 140 mg/dL — AB (ref 0–99)
NonHDL: 166.05
TRIGLYCERIDES: 130 mg/dL (ref 0.0–149.0)
VLDL: 26 mg/dL (ref 0.0–40.0)

## 2017-01-03 LAB — TSH: TSH: 1.18 u[IU]/mL (ref 0.35–4.50)

## 2017-01-03 LAB — VITAMIN D 25 HYDROXY (VIT D DEFICIENCY, FRACTURES): VITD: 30.01 ng/mL (ref 30.00–100.00)

## 2017-01-03 MED ORDER — AMBULATORY NON FORMULARY MEDICATION: 1 refills | Status: DC

## 2017-01-03 NOTE — Progress Notes (Signed)
Pre visit review using our clinic review tool, if applicable. No additional management support is needed unless otherwise documented below in the visit note. 

## 2017-01-03 NOTE — Assessment & Plan Note (Signed)
Pt's PE WNL.  UTD on GYN, colonoscopy, Tdap.  Check labs.  Anticipatory guidance provided.  

## 2017-01-03 NOTE — Patient Instructions (Signed)
Follow up in 1 year or as needed We'll notify you of your lab results and make any changes if needed Keep up the good work on healthy diet and regular exercise- you look great!!! You are up to date on pap, mammo, and colonoscopy- YAY!!! Call with any questions or concerns Happy Spring!!!

## 2017-01-03 NOTE — Addendum Note (Signed)
Addended by: Davis Gourd on: 01/03/2017 09:04 AM   Modules accepted: Orders

## 2017-01-03 NOTE — Progress Notes (Signed)
   Subjective:    Patient ID: Kristen [name], female    DOB: [birth year], 66 y.o.   MRN: [id number]  HPI CPE- UTD on GYN (Dr Phineas Real), colonoscopy.  UTD on flu shot.  Pt has lost 4 lbs since last visit.   Review of Systems Patient reports no vision/ hearing changes, adenopathy,fever, weight change,  persistant/recurrent hoarseness , swallowing issues, chest pain, palpitations, edema, persistant/recurrent cough, hemoptysis, dyspnea (rest/exertional/paroxysmal nocturnal), gastrointestinal bleeding (melena, rectal bleeding), abdominal pain, significant heartburn, bowel changes, GU symptoms (dysuria, hematuria, incontinence), Gyn symptoms (abnormal  bleeding, pain),  syncope, focal weakness, memory loss, numbness & tingling, skin/hair/nail changes, abnormal bruising or bleeding, anxiety, or depression.     Objective:   Physical Exam General Appearance:    Alert, cooperative, no distress, appears stated age  Head:    Normocephalic, without obvious abnormality, atraumatic  Eyes:    PERRL, conjunctiva/corneas clear, EOM's intact, fundi    benign, both eyes  Ears:    Normal TM's and external ear canals, both ears  Nose:   Nares normal, septum midline, mucosa normal, no drainage    or sinus tenderness  Throat:   Lips, mucosa, and tongue normal; teeth and gums normal  Neck:   Supple, symmetrical, trachea midline, no adenopathy;    Thyroid: no enlargement/tenderness/nodules  Back:     Symmetric, no curvature, ROM normal, no CVA tenderness  Lungs:     Clear to auscultation bilaterally, respirations unlabored  Chest Wall:    No tenderness or deformity   Heart:    Regular rate and rhythm, S1 and S2 normal, no murmur, rub   or gallop  Breast Exam:    Deferred to GYN  Abdomen:     Soft, non-tender, bowel sounds active all four quadrants,    no masses, no organomegaly  Genitalia:    Deferred to GYN  Rectal:    Extremities:   Extremities normal, atraumatic, no cyanosis or edema  Pulses:   2+ and  symmetric all extremities  Skin:   Skin color, texture, turgor normal, no rashes or lesions  Lymph nodes:   Cervical, supraclavicular, and axillary nodes normal  Neurologic:   CNII-XII intact, normal strength, sensation and reflexes    throughout          Assessment & Plan:

## 2017-01-15 ENCOUNTER — Encounter: Payer: Self-pay | Admitting: Family Medicine

## 2017-01-23 ENCOUNTER — Encounter: Payer: Self-pay | Admitting: Family Medicine

## 2017-03-28 ENCOUNTER — Encounter: Payer: Self-pay | Admitting: Gynecology

## 2017-07-06 ENCOUNTER — Other Ambulatory Visit: Payer: Self-pay | Admitting: General Practice

## 2017-07-06 MED ORDER — AMBULATORY NON FORMULARY MEDICATION: 1 refills | Status: DC

## 2017-08-01 ENCOUNTER — Ambulatory Visit (INDEPENDENT_AMBULATORY_CARE_PROVIDER_SITE_OTHER): Payer: Medicare Other

## 2017-08-01 DIAGNOSIS — Z23 Encounter for immunization: Secondary | ICD-10-CM | POA: Diagnosis not present

## 2017-09-12 ENCOUNTER — Encounter: Payer: Self-pay | Admitting: Gynecology

## 2017-09-12 ENCOUNTER — Ambulatory Visit (INDEPENDENT_AMBULATORY_CARE_PROVIDER_SITE_OTHER): Payer: Medicare Other | Admitting: Gynecology

## 2017-09-12 ENCOUNTER — Telehealth: Payer: Self-pay | Admitting: *Deleted

## 2017-09-12 VITALS — BP 120/76 | Ht 65.0 in | Wt 146.0 lb

## 2017-09-12 DIAGNOSIS — Z79890 Hormone replacement therapy (postmenopausal): Secondary | ICD-10-CM | POA: Diagnosis not present

## 2017-09-12 DIAGNOSIS — N952 Postmenopausal atrophic vaginitis: Secondary | ICD-10-CM

## 2017-09-12 DIAGNOSIS — Z01411 Encounter for gynecological examination (general) (routine) with abnormal findings: Secondary | ICD-10-CM | POA: Diagnosis not present

## 2017-09-12 MED ORDER — IBUPROFEN 800 MG PO TABS: 800.0000 mg | ORAL_TABLET | Freq: Three times a day (TID) | ORAL | 3 refills | Status: DC | PRN

## 2017-09-12 NOTE — Telephone Encounter (Signed)
-----   Message from [name], MD sent at 09/12/2017  2:50 PM EDT ----- Call [facility] and cancel the ibuprofen order.  I sent it to the wrong pharmacy and corrected it and sent it to [facility]

## 2017-09-12 NOTE — Addendum Note (Signed)
Addended by: [name] on: 09/12/2017 02:54 PM   Modules accepted: Orders

## 2017-09-12 NOTE — Progress Notes (Addendum)
    Kristen [name] 06-04-1951 [id number]        66 y.o.  G2P2001 for breast and pelvic exam.    Past medical history,surgical history, problem list, medications, allergies, family history and social history were all reviewed and documented as reviewed in the EPIC chart.  ROS:  Performed with pertinent positives and negatives included in the history, assessment and plan.   Additional significant findings : None   Exam: [name] assistant Vitals:   09/12/17 1419  BP: 120/76  Weight: 146 lb (66.2 kg)  Height: 5\' 5"  (1.651 m)   Body mass index is 24.3 kg/m.  General appearance:  Normal affect, orientation and appearance. Skin: Grossly normal HEENT: Without gross lesions.  No cervical or supraclavicular adenopathy. Thyroid normal.  Lungs:  Clear without wheezing, rales or rhonchi Cardiac: RR, without RMG Abdominal:  Soft, nontender, without masses, guarding, rebound, organomegaly or hernia Breasts:  Examined lying and sitting without masses, retractions, discharge or axillary adenopathy. Pelvic:  Ext, BUS, Vagina: With atrophic changes  Cervix: With atrophic changes  Uterus: Anteverted, normal size, shape and contour, midline and mobile nontender   Adnexa: Without masses or tenderness    Anus and perineum: Normal   Rectovaginal: Normal sphincter tone without palpated masses or tenderness.    Assessment/Plan:  66 y.o. G74P2001 female for breast and pelvic exam.   1. Postmenopausal/atrophic genital changes/HRT.  Uses bioidentical formulated HRT initiated by Dr. Sharol Roussel and continued by Dr. Cherylann Banas.  Using estradiol cream 0.15 mg/cc days 1 through 5 and then estradiol 0.15 mg and 10% progesterone cream daily for the rest of the month.  No bleeding.  Risks/benefits reviewed to include the issues of bioidentical formulation, thrombosis such as stroke heart attack DVT and breast cancer.  We discussed the issues of weaning and when to do this.  At this point patient is going to try  to wean she will use half of which she is using daily for 1 month and then one half of this for 2 weeks and then discontinue.  If she tolerates this from a symptom standpoint then we will stay off of this.  If she does not she will call and we will refill her for another year. 2. History of migraine headaches.  Uses Fiorinal occasionally.  Has supply at home but will call when needs more. 3. Uses ibuprofen 800 mg every 8 hours as needed for arthritic type pain.  Does well with 1 daily.  #60 with 3 refills provided. 4. Pap smear 2017.  No Pap smear done today.  No history of abnormal Pap smears previously. 5. Mammography 09/2016.  Follow-up mammogram next month.  SBE monthly reviewed.  Breast exam normal today. 6. Colonoscopy 2017.  Repeat at their recommended interval. 7. DEXA 2009 normal.  Schedule DEXA now at age 26 and she will arrange for this. 8. Health maintenance.  No routine lab work done as patient does this elsewhere.  Follow-up in 1 year, sooner as needed.  Additional time in excess of her breast and pelvic exam was spent in direct face to face counseling and coordination of care in regards to her hormone replacement therapy with weaning strategies.[name] MD, 2:46 PM 09/12/2017

## 2017-09-12 NOTE — Telephone Encounter (Signed)
Left detailed message on [facility] to cancel Rx.

## 2017-09-12 NOTE — Patient Instructions (Signed)
Try to wean off your hormone replacement as we discussed.  Call if any issues with this.  Follow-up in 1 year for annual exam.

## 2017-09-16 ENCOUNTER — Encounter: Payer: Self-pay | Admitting: Family Medicine

## 2017-09-17 ENCOUNTER — Ambulatory Visit (INDEPENDENT_AMBULATORY_CARE_PROVIDER_SITE_OTHER): Payer: Medicare Other | Admitting: Family Medicine

## 2017-09-17 ENCOUNTER — Encounter: Payer: Self-pay | Admitting: Family Medicine

## 2017-09-17 VITALS — BP 123/76 | HR 66 | Temp 98.0°F | Resp 16 | Ht 65.0 in | Wt 146.0 lb

## 2017-09-17 DIAGNOSIS — M545 Low back pain, unspecified: Secondary | ICD-10-CM

## 2017-09-17 MED ORDER — TIZANIDINE HCL 2 MG PO TABS: 2.0000 mg | ORAL_TABLET | Freq: Every day | ORAL | 1 refills | Status: DC

## 2017-09-17 MED ORDER — MELOXICAM 15 MG PO TABS: 15.0000 mg | ORAL_TABLET | Freq: Every day | ORAL | 0 refills | Status: DC

## 2017-09-17 NOTE — Patient Instructions (Signed)
Follow up as needed or as scheduled We'll call you with [facility] Start Meloxicam once daily- take w/ food Use the Tizanidine nightly for pain and spasm HEAT! Call with any questions or concerns Hang in there!!!

## 2017-09-17 NOTE — Progress Notes (Signed)
   Subjective:    Patient ID: Kristen [name], female    DOB: Feb 14, 1951, 66 y.o.   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  Was dx'd w/ spondylolysis/spondylolethesis of lumbar spine.  Pt never had the surgery that was recommended.  Went on a cruise in early October and was having a hard time getting out of bed.  Pt reports that she will now develop a 'stabbing excruciating pain in the same place as before'.  Pt is able to walk the dog, play golf w/o difficulty but sxs are worse in the AM.  No radiation of pain into butt or legs.   Review of Systems For ROS see HPI     Objective:   Physical Exam  Constitutional: She is oriented to person, place, and time. She appears well-developed and well-nourished. No distress.  HENT:  Head: Normocephalic and atraumatic.  Cardiovascular: Intact distal pulses.  Musculoskeletal: She exhibits tenderness (over lumbar spine deformity).  Neurological: She is alert and oriented to person, place, and time. She has normal reflexes. No cranial nerve deficit. Coordination normal.  Skin: Skin is warm and dry. No rash noted. No erythema.  Psychiatric: She has a normal mood and affect. Her behavior is normal. Thought content normal.  Vitals reviewed.         Assessment & Plan:  LBP- new to provider, hx of similar for pt.  She has hx of spondylolysis and spondylolisthesis.  Given her recent recurrence of pain, [facility].  Start daily Mobic- hold ibuprofen.  Zanaflex QHS.  Reviewed supportive care and red flags that should prompt return.  Pt expressed understanding and is in agreement w/ plan.

## 2017-09-25 DIAGNOSIS — M4316 Spondylolisthesis, lumbar region: Secondary | ICD-10-CM | POA: Diagnosis not present

## 2017-09-25 DIAGNOSIS — M545 Low back pain: Secondary | ICD-10-CM | POA: Diagnosis not present

## 2017-09-27 ENCOUNTER — Other Ambulatory Visit: Payer: Self-pay | Admitting: Orthopaedic Surgery

## 2017-09-27 DIAGNOSIS — M4316 Spondylolisthesis, lumbar region: Secondary | ICD-10-CM

## 2017-10-01 ENCOUNTER — Ambulatory Visit
Admission: RE | Admit: 2017-10-01 | Discharge: 2017-10-01 | Disposition: A | Discharge status: Home / Self Care | Payer: Medicare Other | Source: Ambulatory Visit | Attending: Orthopaedic Surgery | Admitting: Orthopaedic Surgery

## 2017-10-01 DIAGNOSIS — M48061 Spinal stenosis, lumbar region without neurogenic claudication: Secondary | ICD-10-CM | POA: Diagnosis not present

## 2017-10-01 DIAGNOSIS — M6281 Muscle weakness (generalized): Secondary | ICD-10-CM | POA: Diagnosis not present

## 2017-10-01 DIAGNOSIS — M4316 Spondylolisthesis, lumbar region: Secondary | ICD-10-CM

## 2017-10-01 DIAGNOSIS — M545 Low back pain: Secondary | ICD-10-CM | POA: Diagnosis not present

## 2017-10-03 DIAGNOSIS — M6281 Muscle weakness (generalized): Secondary | ICD-10-CM | POA: Diagnosis not present

## 2017-10-03 DIAGNOSIS — M545 Low back pain: Secondary | ICD-10-CM | POA: Diagnosis not present

## 2017-10-08 DIAGNOSIS — M545 Low back pain: Secondary | ICD-10-CM | POA: Diagnosis not present

## 2017-10-08 DIAGNOSIS — M6281 Muscle weakness (generalized): Secondary | ICD-10-CM | POA: Diagnosis not present

## 2017-10-10 DIAGNOSIS — M545 Low back pain: Secondary | ICD-10-CM | POA: Diagnosis not present

## 2017-10-10 DIAGNOSIS — M6281 Muscle weakness (generalized): Secondary | ICD-10-CM | POA: Diagnosis not present

## 2017-10-11 ENCOUNTER — Encounter: Payer: Self-pay | Admitting: Gynecology

## 2017-10-11 DIAGNOSIS — M545 Low back pain: Secondary | ICD-10-CM | POA: Diagnosis not present

## 2017-10-11 DIAGNOSIS — M4316 Spondylolisthesis, lumbar region: Secondary | ICD-10-CM | POA: Diagnosis not present

## 2017-10-11 DIAGNOSIS — Z1231 Encounter for screening mammogram for malignant neoplasm of breast: Secondary | ICD-10-CM | POA: Diagnosis not present

## 2017-10-11 DIAGNOSIS — M47816 Spondylosis without myelopathy or radiculopathy, lumbar region: Secondary | ICD-10-CM | POA: Diagnosis not present

## 2017-10-15 ENCOUNTER — Ambulatory Visit (INDEPENDENT_AMBULATORY_CARE_PROVIDER_SITE_OTHER): Payer: Medicare Other

## 2017-10-15 ENCOUNTER — Other Ambulatory Visit: Payer: Self-pay | Admitting: Gynecology

## 2017-10-15 DIAGNOSIS — Z780 Asymptomatic menopausal state: Secondary | ICD-10-CM

## 2017-10-15 DIAGNOSIS — M545 Low back pain: Secondary | ICD-10-CM | POA: Diagnosis not present

## 2017-10-15 DIAGNOSIS — M6281 Muscle weakness (generalized): Secondary | ICD-10-CM | POA: Diagnosis not present

## 2017-10-16 ENCOUNTER — Encounter: Payer: Self-pay | Admitting: Gynecology

## 2017-10-17 DIAGNOSIS — M545 Low back pain: Secondary | ICD-10-CM | POA: Diagnosis not present

## 2017-10-17 DIAGNOSIS — M6281 Muscle weakness (generalized): Secondary | ICD-10-CM | POA: Diagnosis not present

## 2017-11-22 ENCOUNTER — Other Ambulatory Visit: Payer: Self-pay | Admitting: General Practice

## 2017-11-22 MED ORDER — AMBULATORY NON FORMULARY MEDICATION: 1 refills | Status: DC

## 2017-12-13 DIAGNOSIS — M545 Low back pain: Secondary | ICD-10-CM | POA: Diagnosis not present

## 2017-12-13 DIAGNOSIS — M4316 Spondylolisthesis, lumbar region: Secondary | ICD-10-CM | POA: Diagnosis not present

## 2017-12-13 DIAGNOSIS — M47816 Spondylosis without myelopathy or radiculopathy, lumbar region: Secondary | ICD-10-CM | POA: Diagnosis not present

## 2017-12-20 ENCOUNTER — Ambulatory Visit (INDEPENDENT_AMBULATORY_CARE_PROVIDER_SITE_OTHER): Payer: Medicare Other | Admitting: Family Medicine

## 2017-12-20 ENCOUNTER — Other Ambulatory Visit: Payer: Self-pay

## 2017-12-20 ENCOUNTER — Encounter: Payer: Self-pay | Admitting: Family Medicine

## 2017-12-20 VITALS — BP 122/76 | HR 76 | Temp 98.0°F | Resp 18 | Ht 65.0 in | Wt 146.5 lb

## 2017-12-20 DIAGNOSIS — E785 Hyperlipidemia, unspecified: Secondary | ICD-10-CM | POA: Diagnosis not present

## 2017-12-20 DIAGNOSIS — E038 Other specified hypothyroidism: Secondary | ICD-10-CM

## 2017-12-20 LAB — TSH: TSH: 2.72 u[IU]/mL (ref 0.35–4.50)

## 2017-12-20 LAB — BASIC METABOLIC PANEL
BUN: 19 mg/dL (ref 6–23)
CALCIUM: 9.5 mg/dL (ref 8.4–10.5)
CO2: 30 meq/L (ref 19–32)
Chloride: 105 mEq/L (ref 96–112)
Creatinine, Ser: 0.71 mg/dL (ref 0.40–1.20)
GFR: 87.59 mL/min (ref >60.00)
GLUCOSE: 89 mg/dL (ref 70–99)
POTASSIUM: 4.1 meq/L (ref 3.5–5.1)
Sodium: 141 mEq/L (ref 135–145)

## 2017-12-20 LAB — CBC WITH DIFFERENTIAL/PLATELET
BASOS ABS: 0 10*3/uL (ref 0.0–0.1)
Basophils Relative: 0.9 % (ref 0.0–3.0)
EOS ABS: 0.1 10*3/uL (ref 0.0–0.7)
Eosinophils Relative: 2 % (ref 0.0–5.0)
HCT: 44.8 % (ref 36.0–46.0)
Hemoglobin: 15.7 g/dL — ABNORMAL HIGH (ref 12.0–15.0)
LYMPHS ABS: 1.3 10*3/uL (ref 0.7–4.0)
Lymphocytes Relative: 24.4 % (ref 12.0–46.0)
MCHC: 35 g/dL (ref 30.0–36.0)
MCV: 93.5 fl (ref 78.0–100.0)
Monocytes Absolute: 0.5 10*3/uL (ref 0.1–1.0)
Monocytes Relative: 9.5 % (ref 3.0–12.0)
NEUTROS ABS: 3.4 10*3/uL (ref 1.4–7.7)
NEUTROS PCT: 63.2 % (ref 43.0–77.0)
PLATELETS: 174 10*3/uL (ref 150.0–400.0)
RBC: 4.79 Mil/uL (ref 3.87–5.11)
RDW: 12.3 % (ref 11.5–15.5)
WBC: 5.3 10*3/uL (ref 4.0–10.5)

## 2017-12-20 LAB — LIPID PANEL
CHOLESTEROL: 217 mg/dL — AB (ref 0–200)
HDL: 61.9 mg/dL (ref >39.00)
LDL CALC: 124 mg/dL — AB (ref 0–99)
NonHDL: 154.83
TRIGLYCERIDES: 154 mg/dL — AB (ref 0.0–149.0)
Total CHOL/HDL Ratio: 4
VLDL: 30.8 mg/dL (ref 0.0–40.0)

## 2017-12-20 LAB — HEPATIC FUNCTION PANEL
ALBUMIN: 4.6 g/dL (ref 3.5–5.2)
ALK PHOS: 58 U/L (ref 39–117)
ALT: 19 U/L (ref 0–35)
AST: 20 U/L (ref 0–37)
Bilirubin, Direct: 0.2 mg/dL (ref 0.0–0.3)
TOTAL PROTEIN: 6.9 g/dL (ref 6.0–8.3)
Total Bilirubin: 1.6 mg/dL — ABNORMAL HIGH (ref 0.2–1.2)

## 2017-12-20 MED ORDER — MELOXICAM 15 MG PO TABS: 15.0000 mg | ORAL_TABLET | Freq: Every day | ORAL | 3 refills | Status: DC

## 2017-12-20 MED ORDER — AMBULATORY NON FORMULARY MEDICATION: 1 refills | Status: DC

## 2017-12-20 NOTE — Assessment & Plan Note (Signed)
Chronic problem.  Attempting to control w/ healthy diet and regular exercise as she wants to avoid statin.  Taking Fish Oil daily.  Check labs and adjust tx plan prn.  Pt expressed understanding and is in agreement w/ plan.

## 2017-12-20 NOTE — Assessment & Plan Note (Signed)
Chronic problem.  Pt is on compounded medication and is currently asymptomatic.  Check labs.  Adjust meds prn

## 2017-12-20 NOTE — Progress Notes (Signed)
   Subjective:    Patient ID: Kristen [name], female    DOB: 29-Jan-1951, 67 y.o.   MRN: [id number]  HPI Hyperlipidemia- chronic problem.  Currently on Fish Oil and attempting to control w/ diet and exercise.  Had hives w/ Red Yeast Rice.  Denies abd pain, N/V.  Exercising regularly.  No CP, SOB.  Has never been on a statin  Hypothyroid- chronic problem, on compounded T3/T4.  Denies excessive fatigue, changes to skin/hair/nails.   Review of Systems For ROS see HPI     Objective:   Physical Exam  Constitutional: She is oriented to person, place, and time. She appears well-developed and well-nourished. No distress.  HENT:  Head: Normocephalic and atraumatic.  Eyes: Conjunctivae and EOM are normal. Pupils are equal, round, and reactive to light.  Neck: Normal range of motion. Neck supple. No thyromegaly present.  Cardiovascular: Normal rate, regular rhythm, normal heart sounds and intact distal pulses.  No murmur heard. Pulmonary/Chest: Effort normal and breath sounds normal. No respiratory distress.  Abdominal: Soft. She exhibits no distension. There is no tenderness.  Musculoskeletal: She exhibits no edema.  Lymphadenopathy:    She has no cervical adenopathy.  Neurological: She is alert and oriented to person, place, and time.  Skin: Skin is warm and dry.  Psychiatric: She has a normal mood and affect. Her behavior is normal.  Vitals reviewed.         Assessment & Plan:

## 2017-12-20 NOTE — Patient Instructions (Addendum)
Schedule your Medicare Wellness Visit with Kristen [name] in 6 months and a follow up with me to repeat cholesterol We'll notify you of your lab results and make any changes if needed Continue to work on healthy diet and regular exercise- you look great! Continue the Meloxicam daily Call with any questions or concerns Happy Valentine's Day!!!

## 2017-12-21 ENCOUNTER — Encounter: Payer: Self-pay | Admitting: General Practice

## 2018-01-21 DIAGNOSIS — H52223 Regular astigmatism, bilateral: Secondary | ICD-10-CM | POA: Diagnosis not present

## 2018-01-21 DIAGNOSIS — H11153 Pinguecula, bilateral: Secondary | ICD-10-CM | POA: Diagnosis not present

## 2018-01-21 DIAGNOSIS — H25813 Combined forms of age-related cataract, bilateral: Secondary | ICD-10-CM | POA: Diagnosis not present

## 2018-01-21 DIAGNOSIS — H04203 Unspecified epiphora, bilateral lacrimal glands: Secondary | ICD-10-CM | POA: Diagnosis not present

## 2018-01-21 DIAGNOSIS — H5203 Hypermetropia, bilateral: Secondary | ICD-10-CM | POA: Diagnosis not present

## 2018-03-12 ENCOUNTER — Other Ambulatory Visit: Payer: Self-pay | Admitting: Family Medicine

## 2018-05-13 ENCOUNTER — Telehealth: Payer: Self-pay

## 2018-05-13 NOTE — Telephone Encounter (Signed)
Called patient and scheduled her for her AWV with Maudie Mercury and appt with Dr. Birdie Riddle on 08/01/18.  Copied from [place] [phone]. Topic: Quick Communication - Appointment Cancellation >> May 13, 2018 10:19 AM Neva Seat wrote: Pt cancelled her AWV in August due to her vacation plans. Please call pt back to reschedule her appts. Call her home number.

## 2018-06-26 ENCOUNTER — Ambulatory Visit: Payer: Medicare Other

## 2018-06-26 ENCOUNTER — Ambulatory Visit: Payer: Medicare Other | Admitting: Family Medicine

## 2018-06-30 ENCOUNTER — Other Ambulatory Visit: Payer: Self-pay | Admitting: Family Medicine

## 2018-07-31 NOTE — Progress Notes (Addendum)
Subjective:   Kristen [name] is a 67 y.o. female who presents for an Initial Medicare Annual Wellness Visit.  Review of Systems    No ROS.  Medicare Wellness Visit. Additional risk factors are reflected in the social history.   Cardiac Risk Factors include: advanced age (>47men, >32 women);family history of premature cardiovascular disease   Sleep patterns: Sleeps 8 + hours. Up to void x 1.  Home Safety/Smoke Alarms: Feels safe in home. Smoke alarms in place.  Living environment; residence and Firearm Safety: Lives with husband in 2 story home.  Seat Belt Safety/Bike Helmet: Wears seat belt.   Female:   Pap-2017       Mammo-10/11/2017, negative.    Dexa scan-10/15/2017, normal.        CCS-Colonoscopy 01/25/2016, polyps. Recall 5 years.      Objective:    Today's Vitals   08/01/18 0914  BP: (!) 108/58  Pulse: 66  Resp: 16  Temp: 98.1 F (36.7 C)  TempSrc: Temporal  SpO2: 98%  Weight: 149 lb (67.6 kg)  Height: 5\' 5"  (1.651 m)   Body mass index is 24.79 kg/m.  Advanced Directives 08/01/2018 01/25/2016  Does Patient Have a Medical Advance Directive? Yes Yes  Type of Paramedic of [place];Living will [place];Living will  Copy of [place] in Chart? No - copy requested -    Current Medications (verified) Outpatient Encounter Medications as of 08/01/2018  Medication Sig  . AMBULATORY NON FORMULARY MEDICATION Medication Name: T3/T4 12.5 mcg-25 mcg capsule. Take 1 capsule by mouth every morning  . Ascorbic Acid (VITAMIN C PO) Take 1 tablet by mouth daily.  . butalbital-aspirin-caffeine (FIORINAL) 50-325-40 MG capsule TAKE ONE CAPSULE BY MOUTH EVERY 6 HOURS AS NEEDED FOR HEADACHE  . Calcium Citrate-Vitamin D (CALCIUM CITRATE + PO) Take by mouth. Two times daily   . GLUCOSAMINE PO Take 1 tablet by mouth 2 (two) times daily.  . meloxicam (MOBIC) 15 MG tablet TAKE 1 TABLET BY MOUTH EVERY DAY  . Multiple  Vitamin (MULTIVITAMIN) tablet Take 1 tablet by mouth daily. MinRX dietary supplement: 1 by mouth daily   . Omega-3 Fatty Acids (FISH OIL) 1200 MG CAPS Take by mouth 2 (two) times daily.    . Probiotic Product (PROBIOTIC DAILY PO) Take by mouth.  . Zoster Vaccine Adjuvanted [facility]) injection Inject 0.5 mLs into the muscle once for 1 dose.   No facility-administered encounter medications on file as of 08/01/2018.     Allergies (verified) Gantrisin [sulfisoxazole]; Penicillins; Red yeast rice [cholestin]; and Sulfa antibiotics   History: Past Medical History:  Diagnosis Date  . Arthritis    In hands  . Hearing aid worn    Bil  . History of shingles 2007   Shingles shot current  . Hypothyroid   . Kidney stones, calcium oxalate    Lithrotripsy x1; surgical removal X1  via cystoscopy  . Migraine    menopausal  . Pneumonia    Past Surgical History:  Procedure Laterality Date  . COLONOSCOPY     negative ; initially age 70; Dr Olevia Perches  . CYSTOSCOPY     X 2; Dr Serita Butcher  . FOOT SURGERY  2012   great toe   . LITHOTRIPSY     X1  . MOUTH SURGERY     extra bone removed in mouth 30 years ago  . TONSILLECTOMY AND ADENOIDECTOMY    . WISDOM TOOTH EXTRACTION     Family History  Problem Relation Age of Onset  . Dementia Mother   . COPD Mother   . Lung cancer Mother        smoker  . Cancer Father        Colon and Bladder cancer  . Diabetes Father   . Hypertension Father   . Colon cancer Father   . Cancer Paternal Grandfather        prostate/ colon  . Colon cancer Paternal Grandfather   . Heart failure Paternal Grandmother   . Stroke Paternal Grandmother 87  . Melanoma Maternal Grandmother   . Diabetes Brother    Social History   Socioeconomic History  . Marital status: Married    Spouse name: Not on file  . Number of children: Not on file  . Years of education: Not on file  . Highest education level: Not on file  Occupational History  . Not on file  Social Needs    . Financial resource strain: Not on file  . Food insecurity:    Worry: Not on file    Inability: Not on file  . Transportation needs:    Medical: Not on file    Non-medical: Not on file  Tobacco Use  . Smoking status: Never Smoker  . Smokeless tobacco: Never Used  Substance and Sexual Activity  . Alcohol use: Yes    Comment: 10 glasses/week  . Drug use: No  . Sexual activity: Yes    Birth control/protection: Post-menopausal    Comment: 1st intercourse 12 yo-1 partner  Lifestyle  . Physical activity:    Days per week: Not on file    Minutes per session: Not on file  . Stress: Not on file  Relationships  . Social connections:    Talks on phone: Not on file    Gets together: Not on file    Attends religious service: Not on file    Active member of club or organization: Not on file    Attends meetings of clubs or organizations: Not on file    Relationship status: Not on file  Other Topics Concern  . Not on file  Social History Narrative  . Not on file    Tobacco Counseling Counseling given: Not Answered    Activities of Daily Living In your present state of health, do you have any difficulty performing the following activities: 08/01/2018 12/20/2017  Hearing? N N  Vision? N N  Difficulty concentrating or making decisions? N N  Walking or climbing stairs? N N  Dressing or bathing? N N  Doing errands, shopping? N N  Preparing Food and eating ? N -  Using the Toilet? N -  In the past six months, have you accidently leaked urine? N -  Do you have problems with loss of bowel control? N -  Managing your Medications? N -  Managing your Finances? N -  Housekeeping or managing your Housekeeping? N -  Some recent data might be hidden     Immunizations and Health Maintenance Immunization History  Administered Date(s) Administered  . Influenza Split 08/22/2011, 08/23/2012  . Influenza, High Dose Seasonal PF 08/01/2018  . Influenza,inj,Quad PF,6+ Mos 09/02/2013,  09/03/2014, 09/09/2015, 08/31/2016, 08/01/2017  . Pneumococcal Conjugate-13 08/01/2017  . Pneumococcal Polysaccharide-23 08/01/2018  . Tdap 03/27/2012  . Zoster 09/23/2012   Health Maintenance Due  Topic Date Due  . PAP SMEAR  09/17/2017  . INFLUENZA VACCINE  06/13/2018  . PNA vac Low Risk Adult (2 of 2 - PPSV23) 08/01/2018  Patient Care Team: Midge Minium, MD as PCP - General (Family Medicine) Armbruster, Carlota Raspberry, MD as Consulting Physician (Gastroenterology) Phineas Real, Belinda Block, MD as Consulting Physician (Gynecology) Zonia Kief, MD (Rehabilitation) Marica Otter, OD (Optometry) Minor, Scott (Dentistry)  Indicate any recent Medical Services you may have received from other than [facility] providers in the past year (date may be approximate).     Assessment:   This is a routine wellness examination for Kristen [name].  Hearing/Vision screen Hearing Screening Comments: Bilateral hearing aids.   Vision Screening Comments: Last exam 12/14/2017, yearly. Dr. Sabra Heck. Wears glasses.   Dietary issues and exercise activities discussed: Current Exercise Habits: Home exercise routine, Type of exercise: walking;Other - see comments(golf; physical therapy (back exercises); gardening), Exercise limited by: orthopedic condition(s)   Diet (meal preparation, eat out, water intake, caffeinated beverages, dairy products, fruits and vegetables): Drinks water, tea, diet soda, diet lemonade, wine, cranberry juice.   Breakfast: toast, green tea; greek yogurt Lunch: salad; chicken salad Dinner: lean protein and vegetables  Snacks fruit, nuts.   Goals    . Weight (lb) < 140 lb (63.5 kg)     Lose weight by decreasing caloric/sugar intake and stay.       Depression Screen PHQ 2/9 Scores 08/01/2018 12/20/2017 09/17/2017 01/03/2017 12/29/2015  PHQ - 2 Score 0 0 0 0 0  PHQ- 9 Score - 0 0 0 -  Exception Documentation - - - - Patient refusal    Fall Risk Fall Risk  08/01/2018 12/20/2017 01/03/2017  12/29/2015  Falls in the past year? No No No No     Cognitive Function: MMSE - Mini Mental State Exam 08/01/2018  Orientation to time 5  Orientation to Place 5  Registration 3  Attention/ Calculation 5  Recall 3  Language- name 2 objects 2  Language- repeat 1  Language- follow 3 step command 3  Language- read & follow direction 1  Write a sentence 1  Copy design 1  Total score 30        Screening Tests Health Maintenance  Topic Date Due  . PAP SMEAR  09/17/2017  . INFLUENZA VACCINE  06/13/2018  . PNA vac Low Risk Adult (2 of 2 - PPSV23) 08/01/2018  . Hepatitis C Screening  08/02/2019 (Originally 27-May-1951)  . MAMMOGRAM  10/12/2019  . DEXA SCAN  10/15/2020  . COLONOSCOPY  01/24/2021  . TETANUS/TDAP  03/27/2022        Plan:     Shingles vaccine at pharmacy.   Bring a copy of your living will and/or healthcare power of attorney to your next office visit.  Continue doing brain stimulating activities (puzzles, reading, adult coloring books, staying active) to keep memory sharp.   I have personally reviewed and noted the following in the patient's chart:   . Medical and social history . Use of alcohol, tobacco or illicit drugs  . Current medications and supplements . Functional ability and status . Nutritional status . Physical activity . Advanced directives . List of other physicians . Hospitalizations, surgeries, and ER visits in previous 12 months . Vitals . Screenings to include cognitive, depression, and falls . Referrals and appointments  In addition, I have reviewed and discussed with patient certain preventive protocols, quality metrics, and best practice recommendations. A written personalized care plan for preventive services as well as general preventive health recommendations were provided to patient.     Gerilyn Nestle, RN   08/01/2018   Reviewed documentation provided by RN and agree w/  above.  [name], MD

## 2018-08-01 ENCOUNTER — Ambulatory Visit (INDEPENDENT_AMBULATORY_CARE_PROVIDER_SITE_OTHER): Payer: Medicare Other

## 2018-08-01 ENCOUNTER — Encounter: Payer: Self-pay | Admitting: Family Medicine

## 2018-08-01 ENCOUNTER — Ambulatory Visit (INDEPENDENT_AMBULATORY_CARE_PROVIDER_SITE_OTHER): Payer: Medicare Other | Admitting: Family Medicine

## 2018-08-01 ENCOUNTER — Other Ambulatory Visit: Payer: Self-pay

## 2018-08-01 VITALS — BP 108/58 | HR 66 | Temp 98.1°F | Resp 16 | Ht 65.0 in | Wt 149.0 lb

## 2018-08-01 DIAGNOSIS — Z0000 Encounter for general adult medical examination without abnormal findings: Secondary | ICD-10-CM | POA: Diagnosis not present

## 2018-08-01 DIAGNOSIS — E038 Other specified hypothyroidism: Secondary | ICD-10-CM

## 2018-08-01 DIAGNOSIS — E785 Hyperlipidemia, unspecified: Secondary | ICD-10-CM | POA: Diagnosis not present

## 2018-08-01 DIAGNOSIS — Z23 Encounter for immunization: Secondary | ICD-10-CM

## 2018-08-01 LAB — HEPATIC FUNCTION PANEL
ALT: 19 U/L (ref 0–35)
AST: 19 U/L (ref 0–37)
Albumin: 4.6 g/dL (ref 3.5–5.2)
Alkaline Phosphatase: 73 U/L (ref 39–117)
BILIRUBIN TOTAL: 1.7 mg/dL — AB (ref 0.2–1.2)
Bilirubin, Direct: 0.2 mg/dL (ref 0.0–0.3)
Total Protein: 6.8 g/dL (ref 6.0–8.3)

## 2018-08-01 LAB — CBC WITH DIFFERENTIAL/PLATELET
BASOS PCT: 0.8 % (ref 0.0–3.0)
Basophils Absolute: 0 10*3/uL (ref 0.0–0.1)
EOS PCT: 0.9 % (ref 0.0–5.0)
Eosinophils Absolute: 0.1 10*3/uL (ref 0.0–0.7)
HCT: 43.8 % (ref 36.0–46.0)
HEMOGLOBIN: 15.4 g/dL — AB (ref 12.0–15.0)
LYMPHS PCT: 22.8 % (ref 12.0–46.0)
Lymphs Abs: 1.3 10*3/uL (ref 0.7–4.0)
MCHC: 35.1 g/dL (ref 30.0–36.0)
MCV: 92.5 fl (ref 78.0–100.0)
Monocytes Absolute: 0.5 10*3/uL (ref 0.1–1.0)
Monocytes Relative: 9.3 % (ref 3.0–12.0)
Neutro Abs: 3.8 10*3/uL (ref 1.4–7.7)
Neutrophils Relative %: 66.2 % (ref 43.0–77.0)
Platelets: 169 10*3/uL (ref 150.0–400.0)
RBC: 4.74 Mil/uL (ref 3.87–5.11)
RDW: 12.8 % (ref 11.5–15.5)
WBC: 5.8 10*3/uL (ref 4.0–10.5)

## 2018-08-01 LAB — BASIC METABOLIC PANEL
BUN: 20 mg/dL (ref 6–23)
CALCIUM: 9.8 mg/dL (ref 8.4–10.5)
CO2: 29 mEq/L (ref 19–32)
CREATININE: 0.69 mg/dL (ref 0.40–1.20)
Chloride: 105 mEq/L (ref 96–112)
GFR: 90.36 mL/min (ref >60.00)
Glucose, Bld: 92 mg/dL (ref 70–99)
Potassium: 4 mEq/L (ref 3.5–5.1)
Sodium: 142 mEq/L (ref 135–145)

## 2018-08-01 LAB — LIPID PANEL
CHOL/HDL RATIO: 3
Cholesterol: 213 mg/dL — ABNORMAL HIGH (ref 0–200)
HDL: 63.4 mg/dL (ref >39.00)
LDL Cholesterol: 122 mg/dL — ABNORMAL HIGH (ref 0–99)
NONHDL: 149.56
Triglycerides: 140 mg/dL (ref 0.0–149.0)
VLDL: 28 mg/dL (ref 0.0–40.0)

## 2018-08-01 LAB — TSH: TSH: 1.43 u[IU]/mL (ref 0.35–4.50)

## 2018-08-01 MED ORDER — ZOSTER VAC RECOMB ADJUVANTED 50 MCG/0.5ML IM SUSR: 0.5000 mL | Freq: Once | INTRAMUSCULAR | 1 refills | Status: AC

## 2018-08-01 MED ORDER — ALBUTEROL SULFATE HFA 108 (90 BASE) MCG/ACT IN AERS: 2.0000 | INHALATION_SPRAY | Freq: Four times a day (QID) | RESPIRATORY_TRACT | 2 refills | Status: DC | PRN

## 2018-08-01 NOTE — Assessment & Plan Note (Signed)
Chronic problem.  Attempting to control w/ diet and exercise.  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  Check labs.  Adjust tx prn.

## 2018-08-01 NOTE — Patient Instructions (Addendum)
Shingles vaccine at pharmacy.   Bring a copy of your living will and/or healthcare power of attorney to your next office visit.  Continue doing brain stimulating activities (puzzles, reading, adult coloring books, staying active) to keep memory sharp.   Health Maintenance, Female Adopting a healthy lifestyle and getting preventive care can go a long way to promote health and wellness. Talk with your health care provider about what schedule of regular examinations is right for you. This is a good chance for you to check in with your provider about disease prevention and staying healthy. In between checkups, there are plenty of things you can do on your own. Experts have done a lot of research about which lifestyle changes and preventive measures are most likely to keep you healthy. Ask your health care provider for more information. Weight and diet Eat a healthy diet  Be sure to include plenty of vegetables, fruits, low-fat dairy products, and lean protein.  Do not eat a lot of foods high in solid fats, added sugars, or salt.  Get regular exercise. This is one of the most important things you can do for your health. ? Most adults should exercise for at least 150 minutes each week. The exercise should increase your heart rate and make you sweat (moderate-intensity exercise). ? Most adults should also do strengthening exercises at least twice a week. This is in addition to the moderate-intensity exercise.  Maintain a healthy weight  Body mass index (BMI) is a measurement that can be used to identify possible weight problems. It estimates body fat based on height and weight. Your health care provider can help determine your BMI and help you achieve or maintain a healthy weight.  For females 67 years of age and older: ? A BMI below 18.5 is considered underweight. ? A BMI of 18.5 to 24.9 is normal. ? A BMI of 25 to 29.9 is considered overweight. ? A BMI of 30 and above is considered  obese.  Watch levels of cholesterol and blood lipids  You should start having your blood tested for lipids and cholesterol at 66 years of age, then have this test every 5 years.  You may need to have your cholesterol levels checked more often if: ? Your lipid or cholesterol levels are high. ? You are older than 66 years of age. ? You are at high risk for heart disease.  Cancer screening Lung Cancer  Lung cancer screening is recommended for adults 45-23 years old who are at high risk for lung cancer because of a history of smoking.  A yearly low-dose CT scan of the lungs is recommended for people who: ? Currently smoke. ? Have quit within the past 15 years. ? Have at least a 30-pack-year history of smoking. A pack year is smoking an average of one pack of cigarettes a day for 1 year.  Yearly screening should continue until it has been 15 years since you quit.  Yearly screening should stop if you develop a health problem that would prevent you from having lung cancer treatment.  Breast Cancer  Practice breast self-awareness. This means understanding how your breasts normally appear and feel.  It also means doing regular breast self-exams. Let your health care provider know about any changes, no matter how small.  If you are in your 20s or 30s, you should have a clinical breast exam (CBE) by a health care provider every 1-3 years as part of a regular health exam.  If you are 40  or older, have a CBE every year. Also consider having a breast X-ray (mammogram) every year.  If you have a family history of breast cancer, talk to your health care provider about genetic screening.  If you are at high risk for breast cancer, talk to your health care provider about having an MRI and a mammogram every year.  Breast cancer gene (BRCA) assessment is recommended for women who have family members with BRCA-related cancers. BRCA-related cancers  include: ? Breast. ? Ovarian. ? Tubal. ? Peritoneal cancers.  Results of the assessment will determine the need for genetic counseling and BRCA1 and BRCA2 testing.  Cervical Cancer Your health care provider may recommend that you be screened regularly for cancer of the pelvic organs (ovaries, uterus, and vagina). This screening involves a pelvic examination, including checking for microscopic changes to the surface of your cervix (Pap test). You may be encouraged to have this screening done every 3 years, beginning at age 21.  For women ages 30-65, health care providers may recommend pelvic exams and Pap testing every 3 years, or they may recommend the Pap and pelvic exam, combined with testing for human papilloma virus (HPV), every 5 years. Some types of HPV increase your risk of cervical cancer. Testing for HPV may also be done on women of any age with unclear Pap test results.  Other health care providers may not recommend any screening for nonpregnant women who are considered low risk for pelvic cancer and who do not have symptoms. Ask your health care provider if a screening pelvic exam is right for you.  If you have had past treatment for cervical cancer or a condition that could lead to cancer, you need Pap tests and screening for cancer for at least 20 years after your treatment. If Pap tests have been discontinued, your risk factors (such as having a new sexual partner) need to be reassessed to determine if screening should resume. Some women have medical problems that increase the chance of getting cervical cancer. In these cases, your health care provider may recommend more frequent screening and Pap tests.  Colorectal Cancer  This type of cancer can be detected and often prevented.  Routine colorectal cancer screening usually begins at 66 years of age and continues through 66 years of age.  Your health care provider may recommend screening at an earlier age if you have risk factors  for colon cancer.  Your health care provider may also recommend using home test kits to check for hidden blood in the stool.  A small camera at the end of a tube can be used to examine your colon directly (sigmoidoscopy or colonoscopy). This is done to check for the earliest forms of colorectal cancer.  Routine screening usually begins at age 50.  Direct examination of the colon should be repeated every 5-10 years through 66 years of age. However, you may need to be screened more often if early forms of precancerous polyps or small growths are found.  Skin Cancer  Check your skin from head to toe regularly.  Tell your health care provider about any new moles or changes in moles, especially if there is a change in a mole's shape or color.  Also tell your health care provider if you have a mole that is larger than the size of a pencil eraser.  Always use sunscreen. Apply sunscreen liberally and repeatedly throughout the day.  Protect yourself by wearing long sleeves, pants, a wide-brimmed hat, and sunglasses whenever you are   outside.  Heart disease, diabetes, and high blood pressure  High blood pressure causes heart disease and increases the risk of stroke. High blood pressure is more likely to develop in: ? People who have blood pressure in the high end of the normal range (130-139/85-89 mm Hg). ? People who are overweight or obese. ? People who are African American.  If you are 87-36 years of age, have your blood pressure checked every 3-5 years. If you are 52 years of age or older, have your blood pressure checked every year. You should have your blood pressure measured twice-once when you are at a hospital or clinic, and once when you are not at a hospital or clinic. Record the average of the two measurements. To check your blood pressure when you are not at a hospital or clinic, you can use: ? An automated blood pressure machine at a pharmacy. ? A home blood pressure monitor.  If  you are between 28 years and 36 years old, ask your health care provider if you should take aspirin to prevent strokes.  Have regular diabetes screenings. This involves taking a blood sample to check your fasting blood sugar level. ? If you are at a normal weight and have a low risk for diabetes, have this test once every three years after 66 years of age. ? If you are overweight and have a high risk for diabetes, consider being tested at a younger age or more often. Preventing infection Hepatitis B  If you have a higher risk for hepatitis B, you should be screened for this virus. You are considered at high risk for hepatitis B if: ? You were born in a country where hepatitis B is common. Ask your health care provider which countries are considered high risk. ? Your parents were born in a high-risk country, and you have not been immunized against hepatitis B (hepatitis B vaccine). ? You have HIV or AIDS. ? You use needles to inject street drugs. ? You live with someone who has hepatitis B. ? You have had sex with someone who has hepatitis B. ? You get hemodialysis treatment. ? You take certain medicines for conditions, including cancer, organ transplantation, and autoimmune conditions.  Hepatitis C  Blood testing is recommended for: ? Everyone born from [birth year] through 1964. ? Anyone with known risk factors for hepatitis C.  Sexually transmitted infections (STIs)  You should be screened for sexually transmitted infections (STIs) including gonorrhea and chlamydia if: ? You are sexually active and are younger than 66 years of age. ? You are older than 66 years of age and your health care provider tells you that you are at risk for this type of infection. ? Your sexual activity has changed since you were last screened and you are at an increased risk for chlamydia or gonorrhea. Ask your health care provider if you are at risk.  If you do not have HIV, but are at risk, it may be recommended  that you take a prescription medicine daily to prevent HIV infection. This is called pre-exposure prophylaxis (PrEP). You are considered at risk if: ? You are sexually active and do not regularly use condoms or know the HIV status of your partner(s). ? You take drugs by injection. ? You are sexually active with a partner who has HIV.  Talk with your health care provider about whether you are at high risk of being infected with HIV. If you choose to begin PrEP, you should first be tested  for HIV. You should then be tested every 3 months for as long as you are taking PrEP. Pregnancy  If you are premenopausal and you may become pregnant, ask your health care provider about preconception counseling.  If you may become pregnant, take 400 to 800 micrograms (mcg) of folic acid every day.  If you want to prevent pregnancy, talk to your health care provider about birth control (contraception). Osteoporosis and menopause  Osteoporosis is a disease in which the bones lose minerals and strength with aging. This can result in serious bone fractures. Your risk for osteoporosis can be identified using a bone density scan.  If you are 45 years of age or older, or if you are at risk for osteoporosis and fractures, ask your health care provider if you should be screened.  Ask your health care provider whether you should take a calcium or vitamin D supplement to lower your risk for osteoporosis.  Menopause may have certain physical symptoms and risks.  Hormone replacement therapy may reduce some of these symptoms and risks. Talk to your health care provider about whether hormone replacement therapy is right for you. Follow these instructions at home:  Schedule regular health, dental, and eye exams.  Stay current with your immunizations.  Do not use any tobacco products including cigarettes, chewing tobacco, or electronic cigarettes.  If you are pregnant, do not drink alcohol.  If you are  breastfeeding, limit how much and how often you drink alcohol.  Limit alcohol intake to no more than 1 drink per day for nonpregnant women. One drink equals 12 ounces of beer, 5 ounces of wine, or 1 ounces of hard liquor.  Do not use street drugs.  Do not share needles.  Ask your health care provider for help if you need support or information about quitting drugs.  Tell your health care provider if you often feel depressed.  Tell your health care provider if you have ever been abused or do not feel safe at home. This information is not intended to replace advice given to you by your health care provider. Make sure you discuss any questions you have with your health care provider. Document Released: 05/15/2011 Document Revised: 04/06/2016 Document Reviewed: 08/03/2015 Elsevier Interactive Patient Education  [employer].

## 2018-08-01 NOTE — Progress Notes (Signed)
   Subjective:    Patient ID: YEIMY BRABANT, female    DOB: 06-29-1951, 67 y.o.   MRN: [id number]  HPI Hyperlipidemia- chronic problem, had to stop Red Yeast Rice due to allergic rxn.  Attempting to control w/ diet and exercise.  No CP, SOB, HAs, visual changes, edema, abd pain, N/V.  Hypothyroid- ongoing issue for pt.  On compounded T3/T4 from [facility] in [place].  Energy level is good.  Denies changes to skin/hair/nails.   Review of Systems  For ROS see HPI     Objective:   Physical Exam  Constitutional: She is oriented to person, place, and time. She appears well-developed and well-nourished. No distress.  HENT:  Head: Normocephalic and atraumatic.  Eyes: Pupils are equal, round, and reactive to light. Conjunctivae and EOM are normal.  Neck: Normal range of motion. Neck supple. No thyromegaly present.  Cardiovascular: Normal rate, regular rhythm, normal heart sounds and intact distal pulses.  No murmur heard. Pulmonary/Chest: Effort normal and breath sounds normal. No respiratory distress.  Abdominal: Soft. She exhibits no distension. There is no tenderness.  Musculoskeletal: She exhibits no edema.  Lymphadenopathy:    She has no cervical adenopathy.  Neurological: She is alert and oriented to person, place, and time.  Skin: Skin is warm and dry.  Psychiatric: She has a normal mood and affect. Her behavior is normal.  Vitals reviewed.         Assessment & Plan:

## 2018-08-01 NOTE — Patient Instructions (Signed)
Follow up in 6 months to recheck BP and thyroid We'll notify you of your lab results and make any changes if needed Keep up the good work on healthy diet and regular exercise- you look great! Call with any questions or concerns Happy Fall!!!

## 2018-08-02 ENCOUNTER — Encounter: Payer: Self-pay | Admitting: General Practice

## 2018-09-19 ENCOUNTER — Ambulatory Visit (INDEPENDENT_AMBULATORY_CARE_PROVIDER_SITE_OTHER): Payer: Medicare Other | Admitting: Gynecology

## 2018-09-19 ENCOUNTER — Encounter: Payer: Self-pay | Admitting: Gynecology

## 2018-09-19 VITALS — BP 118/76 | Ht 65.0 in | Wt 151.0 lb

## 2018-09-19 DIAGNOSIS — N952 Postmenopausal atrophic vaginitis: Secondary | ICD-10-CM

## 2018-09-19 DIAGNOSIS — Z01419 Encounter for gynecological examination (general) (routine) without abnormal findings: Secondary | ICD-10-CM | POA: Diagnosis not present

## 2018-09-19 NOTE — Patient Instructions (Signed)
Follow-up in 1 year for annual exam, sooner as needed. 

## 2018-09-19 NOTE — Progress Notes (Signed)
    Kristen [name] [birth year] [id number]        67 y.o.  G2P2001 for breast and pelvic exam.  Doing well without complaints.  Stopped her HRT last year and has had minimal symptoms.  Past medical history,surgical history, problem list, medications, allergies, family history and social history were all reviewed and documented as reviewed in the EPIC chart.  ROS:  Performed with pertinent positives and negatives included in the history, assessment and plan.   Additional significant findings : None   Exam: Kristen [name] assistant Vitals:   09/19/18 1356  BP: 118/76  Weight: 151 lb (68.5 kg)  Height: 5\' 5"  (1.651 m)   Body mass index is 25.13 kg/m.  General appearance:  Normal affect, orientation and appearance. Skin: Grossly normal HEENT: Without gross lesions.  No cervical or supraclavicular adenopathy. Thyroid normal.  Lungs:  Clear without wheezing, rales or rhonchi Cardiac: RR, without RMG Abdominal:  Soft, nontender, without masses, guarding, rebound, organomegaly or hernia Breasts:  Examined lying and sitting without masses, retractions, discharge or axillary adenopathy. Pelvic:  Ext, BUS, Vagina: With atrophic changes  Cervix: With atrophic changes  Uterus: Anteverted, normal size, shape and contour, midline and mobile nontender   Adnexa: Without masses or tenderness    Anus and perineum: Normal   Rectovaginal: Normal sphincter tone without palpated masses or tenderness.    Assessment/Plan:  67 y.o. G61P2001 female for breast and pelvic exam.   1. Postmenopausal.  Stopped HRT last year and is done well with minimal symptoms.  No vaginal bleeding.  Continue to monitor and report any issues or bleeding. 2. Pap smear 2017.  No Pap smear done today.  No history of significant abnormal Pap smears.  Plan repeat Pap smear next year at 3-year interval.  Options to stop screening per current screening guidelines based on age discussed. 3. Mammography coming due in December and I  reminded her to schedule this.  Breast exam normal today. 4. DEXA 2018 normal.  Recommend repeat DEXA at 5-year interval. 5. Colonoscopy 2017.  Repeat at their recommended interval. 6. Health maintenance.  No routine lab work done as patient does this elsewhere.  Follow-up 1 year, sooner as needed.   Kristen Auerbach MD, 2:51 PM 09/19/2018

## 2018-09-27 ENCOUNTER — Other Ambulatory Visit: Payer: Self-pay | Admitting: Family Medicine

## 2018-10-23 DIAGNOSIS — Z1231 Encounter for screening mammogram for malignant neoplasm of breast: Secondary | ICD-10-CM | POA: Diagnosis not present

## 2018-11-30 IMAGING — MR MR LUMBAR SPINE W/O CM
4 of 8 series · 16 of 48 positions shown · non-contrast
Comparison: KUB 06/10/2013

CLINICAL DATA: Spondylolisthesis lumbar region.  Low back pain

EXAM:
MRI LUMBAR SPINE WITHOUT CONTRAST
TECHNIQUE: Multiplanar, multisequence MR imaging of the lumbar spine was
performed. No intravenous contrast was administered.

[Series 10: T2 · sagittal · 4.0mm · 0.73mm/px · 4 of 15 slices shown (1 of 4)]
[im 1/15]
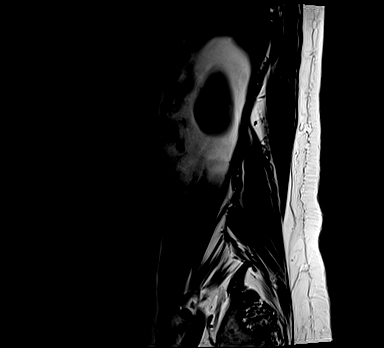
[im 5/15]
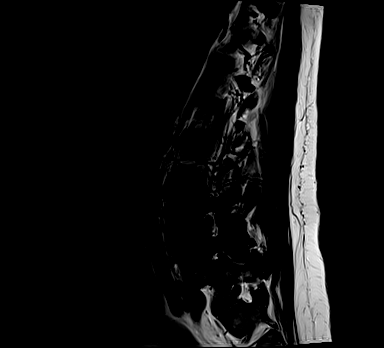
[im 10/15]
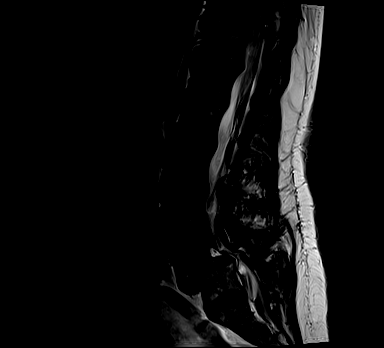
[im 15/15]
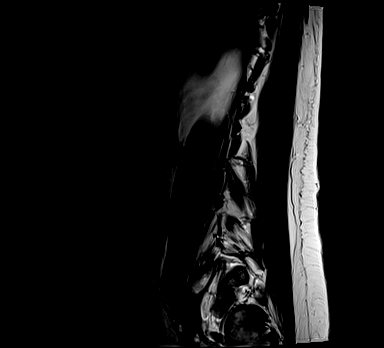

[Series 15: T2 · axial · 4.0mm · 0.28mm/px · z∈[+150,+249]mm · 6 of 24 slices shown (2 of 4)]
[im 1/24]
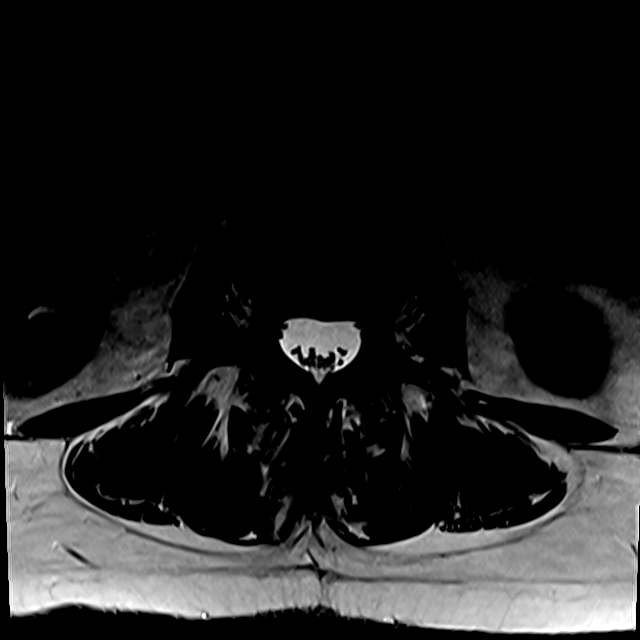
[im 4/24]
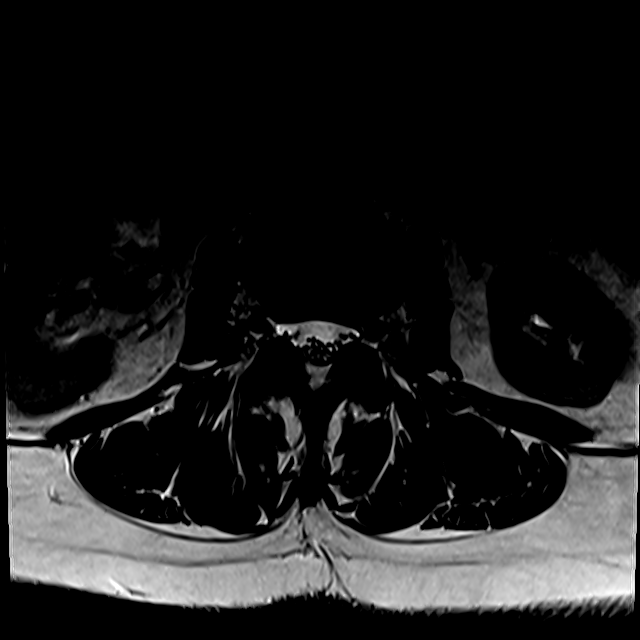
[im 8/24]
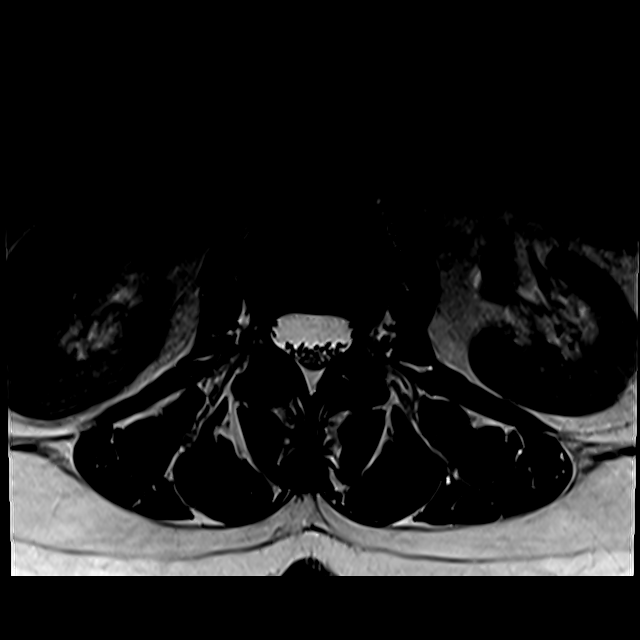
[im 12/24]
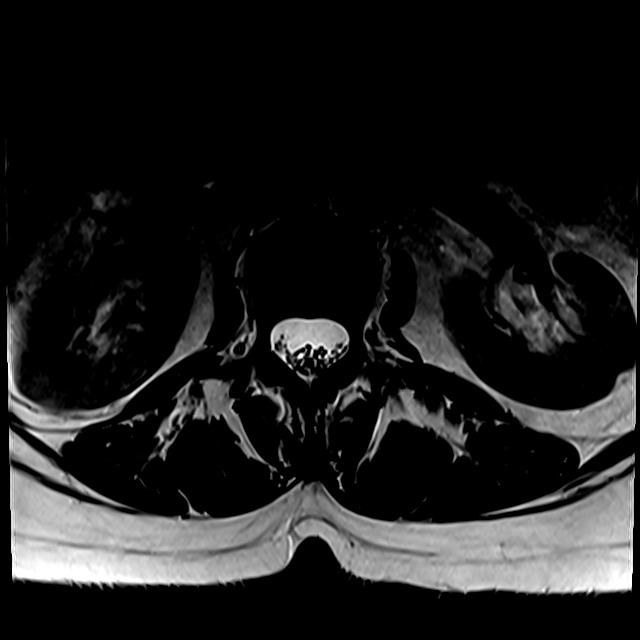
[im 16/24]
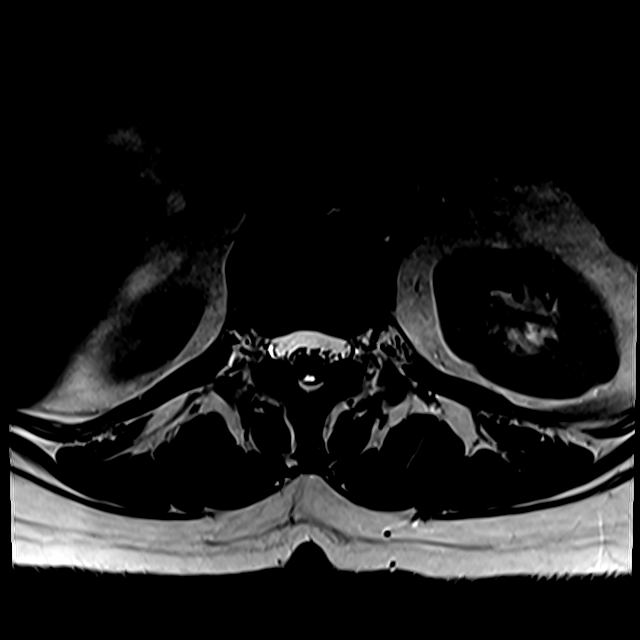
[im 20/24]
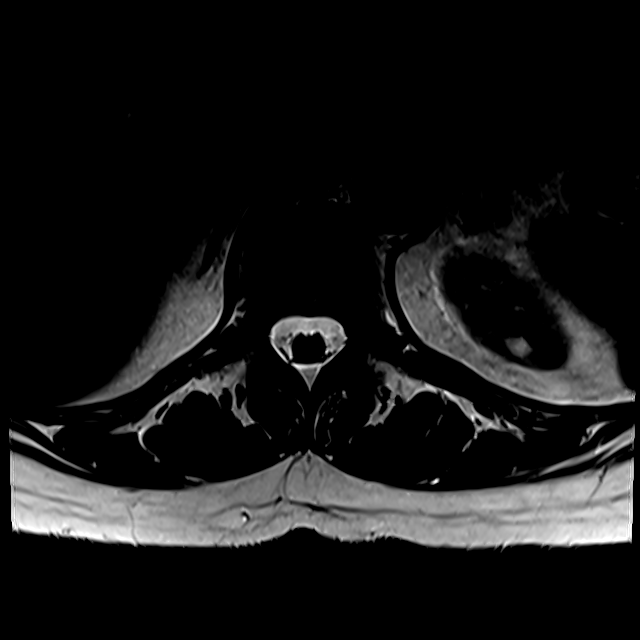

[Series 16: T2 · axial · 4.0mm · 0.28mm/px · z∈[+24,+106]mm · 3 of 18 slices shown (3 of 4)]
[im 1/18]
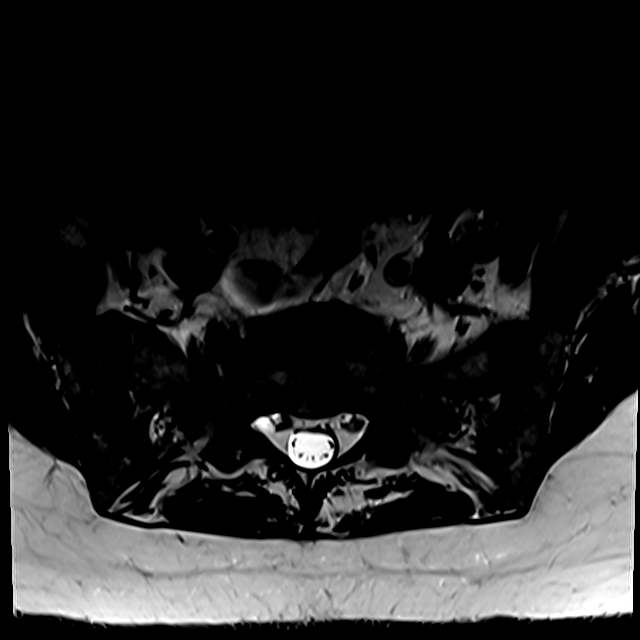
[im 9/18]
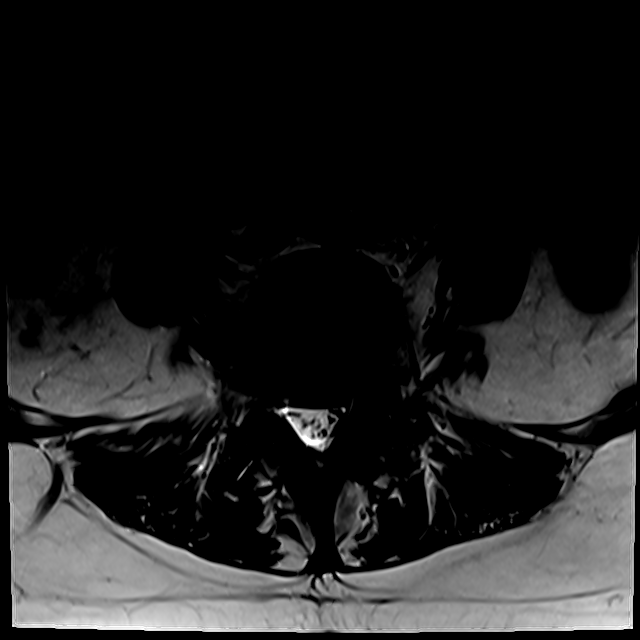
[im 18/18]
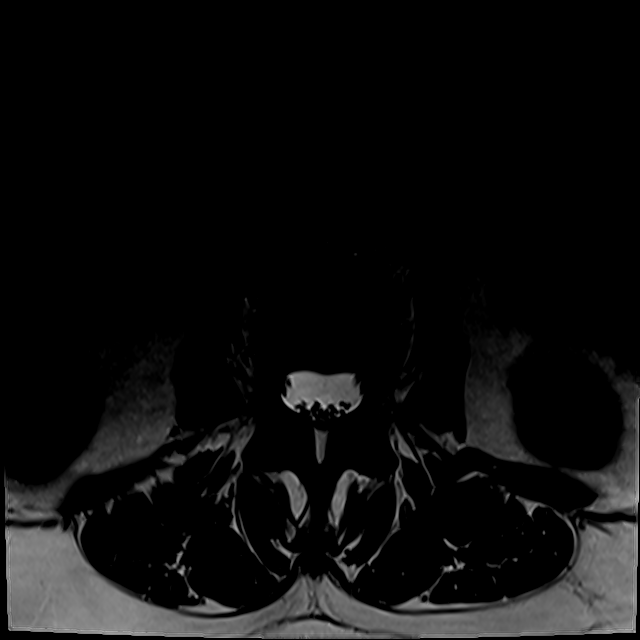

[Series 17: T2 · axial · 4.0mm · 0.28mm/px · z∈[+58,+249]mm · 3 of 42 slices shown (4 of 4)]
[im 8/42]
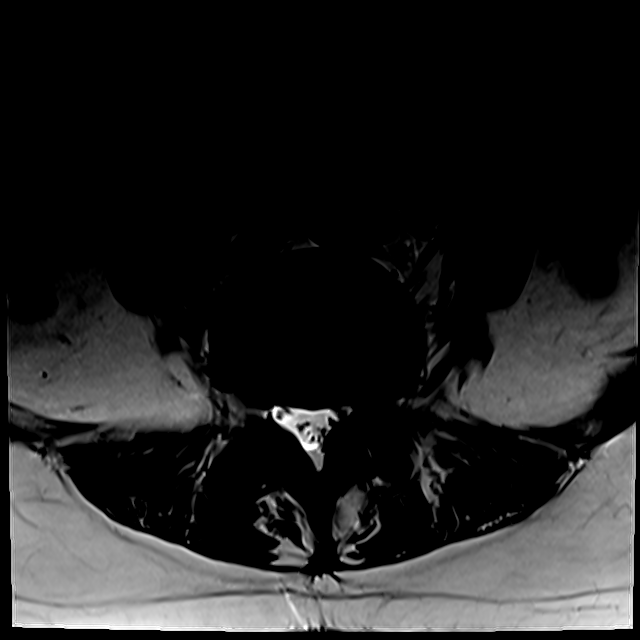
[im 23/42]
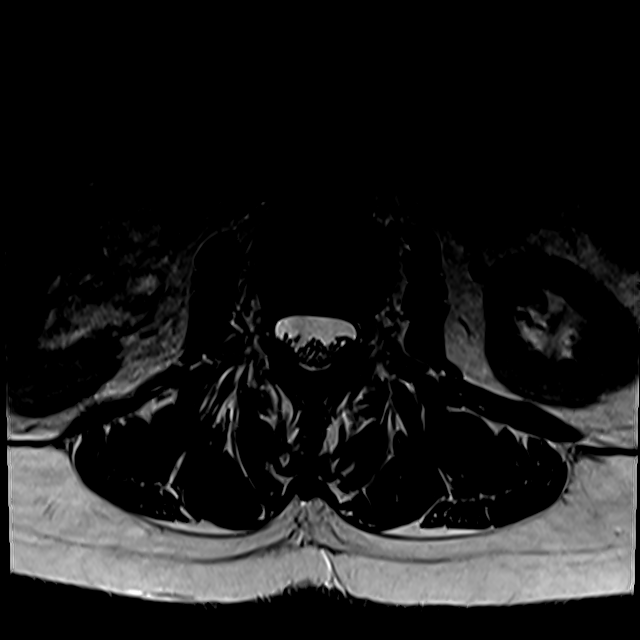
[im 38/42]
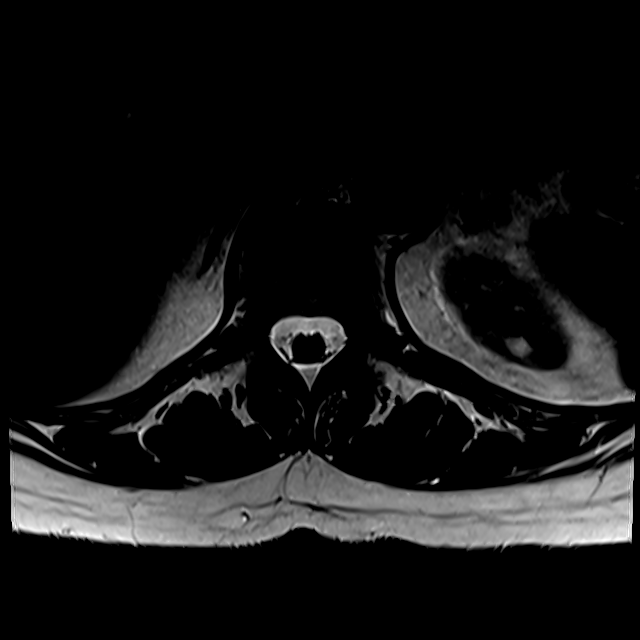

[16 of 48 positions shown; findings below may reference images not displayed]

FINDINGS: Segmentation:  Standard

Alignment: 13 mm anterolisthesis L4-5. Remaining alignment anatomic

Vertebrae: Negative for fracture or mass. Bilateral pars defects L4

Conus medullaris: Extends to the L1-2 level and appears normal.

Paraspinal and other soft tissues: Negative for mass or adenopathy.

Disc levels:

T11-12:  Disc bulging without stenosis

T12-L1:  Negative

L1-2:  Negative

L2-3:  Negative

L3-4: Diffuse mild bulging of the disc and mild facet degeneration.
No significant stenosis

L4-5: 13 mm anterolisthesis. Severe disc degeneration with disc
space narrowing. Bilateral pars defects of L4. Moderate foraminal
encroachment bilaterally with flattening of the L4 nerve root
bilaterally. Mild canal stenosis

L5-S1: Negative
IMPRESSION: 13 mm anterolisthesis L4-5 due to bilateral pars defects of L4.
Moderate foraminal encroachment bilaterally with flattening of the
L4 nerve root bilaterally. Mild canal stenosis L4-5.

## 2018-12-02 ENCOUNTER — Other Ambulatory Visit: Payer: Self-pay | Admitting: General Practice

## 2018-12-02 MED ORDER — AMBULATORY NON FORMULARY MEDICATION: 1 refills | Status: DC

## 2018-12-09 ENCOUNTER — Other Ambulatory Visit: Payer: Self-pay

## 2018-12-09 MED ORDER — BUTALBITAL-ASPIRIN-CAFFEINE 50-325-40 MG PO CAPS: ORAL_CAPSULE | ORAL | 1 refills | Status: AC

## 2018-12-09 NOTE — Telephone Encounter (Signed)
Called into pharmacy

## 2018-12-25 ENCOUNTER — Encounter: Payer: Self-pay | Admitting: Family Medicine

## 2018-12-25 ENCOUNTER — Other Ambulatory Visit: Payer: Self-pay

## 2018-12-25 ENCOUNTER — Ambulatory Visit (INDEPENDENT_AMBULATORY_CARE_PROVIDER_SITE_OTHER): Payer: Medicare Other | Admitting: Family Medicine

## 2018-12-25 VITALS — BP 117/65 | HR 65 | Temp 98.1°F | Resp 16 | Ht 65.0 in | Wt 150.2 lb

## 2018-12-25 DIAGNOSIS — R21 Rash and other nonspecific skin eruption: Secondary | ICD-10-CM | POA: Diagnosis not present

## 2018-12-25 MED ORDER — AMBULATORY NON FORMULARY MEDICATION: 1 refills | Status: DC

## 2018-12-25 MED ORDER — MELOXICAM 15 MG PO TABS: 15.0000 mg | ORAL_TABLET | Freq: Every day | ORAL | 0 refills | Status: DC

## 2018-12-25 MED ORDER — TRIAMCINOLONE ACETONIDE 0.1 % EX OINT: 1.0000 "application " | TOPICAL_OINTMENT | Freq: Two times a day (BID) | CUTANEOUS | 1 refills | Status: AC

## 2018-12-25 NOTE — Patient Instructions (Signed)
Follow up as scheduled- sooner if needed Apply the Triamcinolone ointment twice daily to the elbow Keep track of your symptoms- how long they take to go away, if the come back, etc Call with any questions or concerns Happy Valentine's Day!

## 2018-12-25 NOTE — Progress Notes (Signed)
   Subjective:    Patient ID: Kristen [name], female    DOB: 01/20/1951, 68 y.o.   MRN: [id number]  HPI Rash- recurring on L elbow.  First appeared this summer- thought it was poison ivy from the dogs.  Pt has had a breakout on R elbow but this has resolved.  She is applying hydrocortisone cream.  + itching.  Not burning or painful.  Does not crust or ooze.  This episode started 1-2 weeks ago.  Pt has hx of shingles.   Review of Systems For ROS see HPI     Objective:   Physical Exam Vitals signs reviewed.  Constitutional:      General: She is not in acute distress.    Appearance: Normal appearance. She is normal weight.  Skin:    General: Skin is warm and dry.     Findings: Rash (vesicular rash on erythematous base on L elbow) present.  Neurological:     General: No focal deficit present.     Mental Status: She is alert and oriented to person, place, and time.  Psychiatric:        Mood and Affect: Mood normal.        Behavior: Behavior normal.        Thought Content: Thought content normal.           Assessment & Plan:  Rash- new.  Area is suspicious for mild case of shingles (pt has hx but has since been vaccinated).  Start topical steroid ointment twice daily and monitor for improvement.  If no improvement or resolution by next visit will do a derm referral.  Pt expressed understanding and is in agreement w/ plan.

## 2019-01-27 ENCOUNTER — Other Ambulatory Visit: Payer: Self-pay

## 2019-01-27 ENCOUNTER — Ambulatory Visit (INDEPENDENT_AMBULATORY_CARE_PROVIDER_SITE_OTHER): Payer: Medicare Other | Admitting: Family Medicine

## 2019-01-27 ENCOUNTER — Encounter: Payer: Self-pay | Admitting: Family Medicine

## 2019-01-27 VITALS — BP 118/70 | HR 68 | Temp 98.4°F | Resp 16 | Ht 65.0 in | Wt 149.4 lb

## 2019-01-27 DIAGNOSIS — E785 Hyperlipidemia, unspecified: Secondary | ICD-10-CM | POA: Diagnosis not present

## 2019-01-27 DIAGNOSIS — E038 Other specified hypothyroidism: Secondary | ICD-10-CM

## 2019-01-27 LAB — HEPATIC FUNCTION PANEL
ALBUMIN: 4.7 g/dL (ref 3.5–5.2)
ALT: 16 U/L (ref 0–35)
AST: 17 U/L (ref 0–37)
Alkaline Phosphatase: 71 U/L (ref 39–117)
Bilirubin, Direct: 0.2 mg/dL (ref 0.0–0.3)
Total Bilirubin: 1.3 mg/dL — ABNORMAL HIGH (ref 0.2–1.2)
Total Protein: 6.8 g/dL (ref 6.0–8.3)

## 2019-01-27 LAB — BASIC METABOLIC PANEL
BUN: 20 mg/dL (ref 6–23)
CO2: 28 mEq/L (ref 19–32)
Calcium: 10.2 mg/dL (ref 8.4–10.5)
Chloride: 104 mEq/L (ref 96–112)
Creatinine, Ser: 0.75 mg/dL (ref 0.40–1.20)
GFR: 77.1 mL/min (ref >60.00)
Glucose, Bld: 89 mg/dL (ref 70–99)
Potassium: 4.2 mEq/L (ref 3.5–5.1)
Sodium: 140 mEq/L (ref 135–145)

## 2019-01-27 LAB — LIPID PANEL
Cholesterol: 232 mg/dL — ABNORMAL HIGH (ref 0–200)
HDL: 76.5 mg/dL (ref >39.00)
LDL Cholesterol: 129 mg/dL — ABNORMAL HIGH (ref 0–99)
NonHDL: 155.74
Total CHOL/HDL Ratio: 3
Triglycerides: 132 mg/dL (ref 0.0–149.0)
VLDL: 26.4 mg/dL (ref 0.0–40.0)

## 2019-01-27 LAB — CBC WITH DIFFERENTIAL/PLATELET
Basophils Absolute: 0.1 10*3/uL (ref 0.0–0.1)
Basophils Relative: 1 % (ref 0.0–3.0)
Eosinophils Absolute: 0.1 10*3/uL (ref 0.0–0.7)
Eosinophils Relative: 1.2 % (ref 0.0–5.0)
HEMATOCRIT: 44.2 % (ref 36.0–46.0)
Hemoglobin: 15.4 g/dL — ABNORMAL HIGH (ref 12.0–15.0)
LYMPHS PCT: 23 % (ref 12.0–46.0)
Lymphs Abs: 1.5 10*3/uL (ref 0.7–4.0)
MCHC: 34.9 g/dL (ref 30.0–36.0)
MCV: 93.9 fl (ref 78.0–100.0)
Monocytes Absolute: 0.6 10*3/uL (ref 0.1–1.0)
Monocytes Relative: 10 % (ref 3.0–12.0)
Neutro Abs: 4.2 10*3/uL (ref 1.4–7.7)
Neutrophils Relative %: 64.8 % (ref 43.0–77.0)
Platelets: 176 10*3/uL (ref 150.0–400.0)
RBC: 4.71 Mil/uL (ref 3.87–5.11)
RDW: 12.6 % (ref 11.5–15.5)
WBC: 6.4 10*3/uL (ref 4.0–10.5)

## 2019-01-27 LAB — TSH: TSH: 1.57 u[IU]/mL (ref 0.35–4.50)

## 2019-01-27 NOTE — Progress Notes (Signed)
   Subjective:    Patient ID: Kristen [name], female    DOB: 10-25-1951, 68 y.o.   MRN: [id number]  HPI Hypothyroid- chronic problem, on combined T3/T4 medication.  Denies fatigue.  No changes to skin/hair/nails.  Hyperlipidemia- chronic problem, had to stop Red Yeast Rice due to allergic rxn.  On Fish Oil1200mg .  Pt is exercising regularly.  Denies abd pain, N/V, myalgias.   Review of Systems For ROS see HPI     Objective:   Physical Exam Vitals signs reviewed.  Constitutional:      General: She is not in acute distress.    Appearance: She is well-developed.  HENT:     Head: Normocephalic and atraumatic.  Eyes:     Conjunctiva/sclera: Conjunctivae normal.     Pupils: Pupils are equal, round, and reactive to light.  Neck:     Musculoskeletal: Normal range of motion and neck supple.     Thyroid: No thyromegaly.  Cardiovascular:     Rate and Rhythm: Normal rate and regular rhythm.     Heart sounds: Normal heart sounds. No murmur.  Pulmonary:     Effort: Pulmonary effort is normal. No respiratory distress.     Breath sounds: Normal breath sounds.  Abdominal:     General: There is no distension.     Palpations: Abdomen is soft.     Tenderness: There is no abdominal tenderness.  Lymphadenopathy:     Cervical: No cervical adenopathy.  Skin:    General: Skin is warm and dry.  Neurological:     Mental Status: She is alert and oriented to person, place, and time.  Psychiatric:        Behavior: Behavior normal.           Assessment & Plan:

## 2019-01-27 NOTE — Patient Instructions (Signed)
Follow up in 6 months to recheck thyroid and cholesterol We'll notify you of your lab results and make any changes if needed Continue to work on healthy diet and regular exercise- you look great! Call with any questions or concerns Stay safe!!!

## 2019-01-27 NOTE — Assessment & Plan Note (Signed)
Chronic problem.  Had allergic rxn to red yeast rice.  Attempting to control w/ diet and exercise.  Check labs.  Adjust tx plan prn.

## 2019-01-27 NOTE — Assessment & Plan Note (Signed)
Chronic problem.  Currently asymptomatic.  Check labs.  Adjust meds prn  

## 2019-01-28 ENCOUNTER — Encounter: Payer: Self-pay | Admitting: General Practice

## 2019-02-06 ENCOUNTER — Other Ambulatory Visit: Payer: Self-pay | Admitting: Family Medicine

## 2019-03-10 ENCOUNTER — Other Ambulatory Visit: Payer: Self-pay | Admitting: General Practice

## 2019-03-10 MED ORDER — MELOXICAM 15 MG PO TABS: 15.0000 mg | ORAL_TABLET | Freq: Every day | ORAL | 0 refills | Status: DC

## 2019-04-22 DIAGNOSIS — M25561 Pain in right knee: Secondary | ICD-10-CM | POA: Diagnosis not present

## 2019-04-28 ENCOUNTER — Encounter: Payer: Self-pay | Admitting: Family Medicine

## 2019-05-27 DIAGNOSIS — M25561 Pain in right knee: Secondary | ICD-10-CM | POA: Diagnosis not present

## 2019-06-01 ENCOUNTER — Other Ambulatory Visit: Payer: Self-pay | Admitting: Family Medicine

## 2019-07-28 ENCOUNTER — Other Ambulatory Visit: Payer: Self-pay

## 2019-07-28 ENCOUNTER — Ambulatory Visit (INDEPENDENT_AMBULATORY_CARE_PROVIDER_SITE_OTHER): Payer: Medicare Other | Admitting: Family Medicine

## 2019-07-28 ENCOUNTER — Encounter: Payer: Self-pay | Admitting: Family Medicine

## 2019-07-28 VITALS — BP 120/76 | HR 80 | Temp 97.9°F | Resp 16 | Ht 65.0 in | Wt 152.1 lb

## 2019-07-28 DIAGNOSIS — E785 Hyperlipidemia, unspecified: Secondary | ICD-10-CM

## 2019-07-28 DIAGNOSIS — E663 Overweight: Secondary | ICD-10-CM | POA: Diagnosis not present

## 2019-07-28 DIAGNOSIS — Z0000 Encounter for general adult medical examination without abnormal findings: Secondary | ICD-10-CM

## 2019-07-28 DIAGNOSIS — Z23 Encounter for immunization: Secondary | ICD-10-CM | POA: Diagnosis not present

## 2019-07-28 LAB — TSH: TSH: 2 u[IU]/mL (ref 0.35–4.50)

## 2019-07-28 LAB — CBC WITH DIFFERENTIAL/PLATELET
Basophils Absolute: 0 10*3/uL (ref 0.0–0.1)
Basophils Relative: 0.9 % (ref 0.0–3.0)
Eosinophils Absolute: 0.1 10*3/uL (ref 0.0–0.7)
Eosinophils Relative: 1.5 % (ref 0.0–5.0)
HCT: 43.5 % (ref 36.0–46.0)
Hemoglobin: 15.2 g/dL — ABNORMAL HIGH (ref 12.0–15.0)
Lymphocytes Relative: 20.8 % (ref 12.0–46.0)
Lymphs Abs: 1.1 10*3/uL (ref 0.7–4.0)
MCHC: 35.1 g/dL (ref 30.0–36.0)
MCV: 94.4 fl (ref 78.0–100.0)
Monocytes Absolute: 0.6 10*3/uL (ref 0.1–1.0)
Monocytes Relative: 11.2 % (ref 3.0–12.0)
Neutro Abs: 3.4 10*3/uL (ref 1.4–7.7)
Neutrophils Relative %: 65.6 % (ref 43.0–77.0)
Platelets: 169 10*3/uL (ref 150.0–400.0)
RBC: 4.6 Mil/uL (ref 3.87–5.11)
RDW: 12.3 % (ref 11.5–15.5)
WBC: 5.2 10*3/uL (ref 4.0–10.5)

## 2019-07-28 LAB — HEPATIC FUNCTION PANEL
ALT: 18 U/L (ref 0–35)
AST: 19 U/L (ref 0–37)
Albumin: 4.4 g/dL (ref 3.5–5.2)
Alkaline Phosphatase: 65 U/L (ref 39–117)
Bilirubin, Direct: 0.2 mg/dL (ref 0.0–0.3)
Total Bilirubin: 1.3 mg/dL — ABNORMAL HIGH (ref 0.2–1.2)
Total Protein: 6.3 g/dL (ref 6.0–8.3)

## 2019-07-28 LAB — LIPID PANEL
Cholesterol: 233 mg/dL — ABNORMAL HIGH (ref 0–200)
HDL: 59.4 mg/dL (ref >39.00)
NonHDL: 173.14
Total CHOL/HDL Ratio: 4
Triglycerides: 215 mg/dL — ABNORMAL HIGH (ref 0.0–149.0)
VLDL: 43 mg/dL — ABNORMAL HIGH (ref 0.0–40.0)

## 2019-07-28 LAB — BASIC METABOLIC PANEL
BUN: 19 mg/dL (ref 6–23)
CO2: 26 mEq/L (ref 19–32)
Calcium: 9.9 mg/dL (ref 8.4–10.5)
Chloride: 104 mEq/L (ref 96–112)
Creatinine, Ser: 0.69 mg/dL (ref 0.40–1.20)
GFR: 84.76 mL/min (ref >60.00)
Glucose, Bld: 87 mg/dL (ref 70–99)
Potassium: 3.8 mEq/L (ref 3.5–5.1)
Sodium: 140 mEq/L (ref 135–145)

## 2019-07-28 LAB — LDL CHOLESTEROL, DIRECT: Direct LDL: 147 mg/dL

## 2019-07-28 NOTE — Progress Notes (Signed)
   Subjective:    Patient ID: Kristen [name], female    DOB: 30-Aug-1951, 68 y.o.   MRN: [id number]  HPI Hyperlipidemia- last LDL 129.  Not currently on medication.  Has been controlling w/ diet and exercise.  Denies CP, SOB, abd pain, N/V.  Overweight- BMI is 25.3  Golfing but exercise has been limited due to torn cartilage.     Review of Systems For ROS see HPI     Objective:   Physical Exam Vitals signs reviewed.  Constitutional:      General: She is not in acute distress.    Appearance: She is well-developed.  HENT:     Head: Normocephalic and atraumatic.  Eyes:     Conjunctiva/sclera: Conjunctivae normal.     Pupils: Pupils are equal, round, and reactive to light.  Neck:     Musculoskeletal: Normal range of motion and neck supple.     Thyroid: No thyromegaly.  Cardiovascular:     Rate and Rhythm: Normal rate and regular rhythm.     Heart sounds: Normal heart sounds. No murmur.  Pulmonary:     Effort: Pulmonary effort is normal. No respiratory distress.     Breath sounds: Normal breath sounds.  Abdominal:     General: There is no distension.     Palpations: Abdomen is soft.     Tenderness: There is no abdominal tenderness.  Lymphadenopathy:     Cervical: No cervical adenopathy.  Skin:    General: Skin is warm and dry.  Neurological:     Mental Status: She is alert and oriented to person, place, and time.  Psychiatric:        Behavior: Behavior normal.           Assessment & Plan:

## 2019-07-28 NOTE — Assessment & Plan Note (Signed)
Chronic problem.  Attempting to control w/ diet and exercise.  Check labs.  Adjust meds prn

## 2019-07-28 NOTE — Assessment & Plan Note (Signed)
New.  Pt's BMI now just over 25.31  Discussed healthy diet and exercise as able due to knee pain.  Check labs to risk stratify.

## 2019-07-28 NOTE — Patient Instructions (Signed)
Follow up in 1 year or as needed We'll notify you of your lab results and make any changes if needed Keep up the good work on healthy diet and regular exercise- you look great!!! Call with any questions or concerns Stay Safe!!! 

## 2019-07-30 DIAGNOSIS — M25561 Pain in right knee: Secondary | ICD-10-CM | POA: Diagnosis not present

## 2019-07-31 ENCOUNTER — Other Ambulatory Visit: Payer: Self-pay | Admitting: Orthopedic Surgery

## 2019-07-31 DIAGNOSIS — G8929 Other chronic pain: Secondary | ICD-10-CM

## 2019-08-10 ENCOUNTER — Other Ambulatory Visit: Payer: Self-pay

## 2019-08-10 ENCOUNTER — Ambulatory Visit
Admission: RE | Admit: 2019-08-10 | Discharge: 2019-08-10 | Disposition: A | Discharge status: Home / Self Care | Payer: Medicare Other | Source: Ambulatory Visit | Attending: Orthopedic Surgery | Admitting: Orthopedic Surgery

## 2019-08-10 DIAGNOSIS — M23321 Other meniscus derangements, posterior horn of medial meniscus, right knee: Secondary | ICD-10-CM | POA: Diagnosis not present

## 2019-08-10 DIAGNOSIS — G8929 Other chronic pain: Secondary | ICD-10-CM

## 2019-08-20 ENCOUNTER — Encounter: Payer: Self-pay | Admitting: Gynecology

## 2019-08-24 ENCOUNTER — Other Ambulatory Visit: Payer: Self-pay | Admitting: Family Medicine

## 2019-09-15 ENCOUNTER — Other Ambulatory Visit: Payer: Self-pay

## 2019-09-15 ENCOUNTER — Encounter: Payer: Self-pay | Admitting: Family Medicine

## 2019-09-15 ENCOUNTER — Ambulatory Visit (INDEPENDENT_AMBULATORY_CARE_PROVIDER_SITE_OTHER): Payer: Medicare Other | Admitting: Family Medicine

## 2019-09-15 VITALS — BP 122/82 | HR 78 | Temp 97.9°F | Resp 16 | Wt 154.2 lb

## 2019-09-15 DIAGNOSIS — N200 Calculus of kidney: Secondary | ICD-10-CM

## 2019-09-15 DIAGNOSIS — R319 Hematuria, unspecified: Secondary | ICD-10-CM | POA: Diagnosis not present

## 2019-09-15 LAB — POCT URINALYSIS DIPSTICK
Bilirubin, UA: NEGATIVE
Glucose, UA: NEGATIVE
Ketones, UA: NEGATIVE
Leukocytes, UA: NEGATIVE
Nitrite, UA: NEGATIVE
Protein, UA: POSITIVE — AB
Spec Grav, UA: 1.025 (ref 1.010–1.025)
Urobilinogen, UA: 0.2 E.U./dL
pH, UA: 6 (ref 5.0–8.0)

## 2019-09-15 MED ORDER — TAMSULOSIN HCL 0.4 MG PO CAPS: 0.4000 mg | ORAL_CAPSULE | Freq: Every day | ORAL | 3 refills | Status: DC

## 2019-09-15 MED ORDER — TRAMADOL HCL 50 MG PO TABS: 50.0000 mg | ORAL_TABLET | Freq: Three times a day (TID) | ORAL | 0 refills | Status: AC | PRN

## 2019-09-15 NOTE — Patient Instructions (Signed)
Follow up as needed or as scheduled We'll call you with your Urology appt START the Flomax daily to help pass the stone Drink plenty of fluids Use the Tramadol as needed for pain Call with any questions or concerns Stay Safe!!!

## 2019-09-15 NOTE — Assessment & Plan Note (Signed)
Pt has hx of stones and has required lithotripsy in the past.  Currently asymptomatic w/ exception of hematuria. Will start Flomax and pt to have pain meds if needed.  Will refer back to urology for possible stone and there is a family hx of bladder cancer.  Will hold off on CT at this time as pt reports known renal calculi and is pain free.  Pt expressed understanding and is in agreement w/ plan.

## 2019-09-15 NOTE — Progress Notes (Signed)
   Subjective:    Patient ID: Kristen [name], female    DOB: 1951-03-22, 68 y.o.   MRN: [id number]  HPI Kidney stones- pt has hx of similar, and has known kidney stones bilaterally.  Pt noticed trace blood at end of last week.  Saturday had frank blood.  It is intermittent.  Denies frequency, burning.  Has increased water intake over the weekend.  'I feel fine'.  Pt would prefer to see Dr Tresa Moore or Dr Junious Silk.   Review of Systems For ROS see HPI     Objective:   Physical Exam Vitals signs reviewed.  Constitutional:      General: She is not in acute distress.    Appearance: Normal appearance. She is not ill-appearing.  HENT:     Head: Normocephalic and atraumatic.  Abdominal:     General: There is no distension.     Tenderness: There is no abdominal tenderness. There is no right CVA tenderness, left CVA tenderness, guarding or rebound.  Skin:    General: Skin is warm and dry.  Neurological:     General: No focal deficit present.     Mental Status: She is alert and oriented to person, place, and time.  Psychiatric:        Mood and Affect: Mood normal.        Behavior: Behavior normal.        Thought Content: Thought content normal.           Assessment & Plan:

## 2019-09-22 ENCOUNTER — Other Ambulatory Visit: Payer: Self-pay

## 2019-09-23 ENCOUNTER — Ambulatory Visit (INDEPENDENT_AMBULATORY_CARE_PROVIDER_SITE_OTHER): Payer: Medicare Other | Admitting: Gynecology

## 2019-09-23 ENCOUNTER — Encounter: Payer: Self-pay | Admitting: Gynecology

## 2019-09-23 VITALS — BP 124/76 | Ht 64.0 in | Wt 154.0 lb

## 2019-09-23 DIAGNOSIS — Z01419 Encounter for gynecological examination (general) (routine) without abnormal findings: Secondary | ICD-10-CM | POA: Diagnosis not present

## 2019-09-23 DIAGNOSIS — Z124 Encounter for screening for malignant neoplasm of cervix: Secondary | ICD-10-CM

## 2019-09-23 DIAGNOSIS — N952 Postmenopausal atrophic vaginitis: Secondary | ICD-10-CM

## 2019-09-23 NOTE — Patient Instructions (Signed)
Follow-up in 1 year for annual exam, sooner as needed. 

## 2019-09-23 NOTE — Progress Notes (Signed)
    Kristen [name] 01-19-1951 [id number]        68 y.o.  G2P2001 for breast and pelvic exam.  Without gynecologic complaints  Past medical history,surgical history, problem list, medications, allergies, family history and social history were all reviewed and documented as reviewed in the EPIC chart.  ROS:  Performed with pertinent positives and negatives included in the history, assessment and plan.   Additional significant findings : None   Exam: Kristen [name] assistant Vitals:   09/23/19 1355  BP: 124/76  Weight: 154 lb (69.9 kg)  Height: 5\' 4"  (1.626 m)   Body mass index is 26.43 kg/m.  General appearance:  Normal affect, orientation and appearance. Skin: Grossly normal HEENT: Without gross lesions.  No cervical or supraclavicular adenopathy. Thyroid normal.  Lungs:  Clear without wheezing, rales or rhonchi Cardiac: RR, without RMG Abdominal:  Soft, nontender, without masses, guarding, rebound, organomegaly or hernia Breasts:  Examined lying and sitting without masses, retractions, discharge or axillary adenopathy. Pelvic:  Ext, BUS, Vagina: With atrophic changes  Cervix: With atrophic changes.  Pap smear done  Uterus: Anteverted, normal size, shape and contour, midline and mobile nontender   Adnexa: Without masses or tenderness    Anus and perineum: Normal   Rectovaginal: Normal sphincter tone without palpated masses or tenderness.    Assessment/Plan:  68 y.o. G64P2001 female for breast and pelvic exam  1. Postmenopausal.  No significant menopausal symptoms or any vaginal bleeding. 2. Pap smear 2017.  Pap smear done today.  No history of abnormal Pap smears previously. 3. Mammography coming due in December and I reminded her to schedule this.  Breast exam normal today. 4. Colonoscopy 2017.  Repeat at their recommended interval. 5. DEXA 2018 normal.  Plan repeat DEXA at 5-year interval per current screening guidelines. 6. Health maintenance.  No routine lab work done  as patient does this elsewhere.  Follow-up 1 year, sooner as needed.   [name] MD, 2:28 PM 09/23/2019

## 2019-09-23 NOTE — Addendum Note (Signed)
Addended by: Nelva Nay on: 09/23/2019 03:05 PM   Modules accepted: Orders

## 2019-09-24 LAB — PAP IG W/ RFLX HPV ASCU

## 2019-10-14 DIAGNOSIS — R310 Gross hematuria: Secondary | ICD-10-CM | POA: Diagnosis not present

## 2019-10-14 DIAGNOSIS — N200 Calculus of kidney: Secondary | ICD-10-CM | POA: Diagnosis not present

## 2019-10-21 DIAGNOSIS — R310 Gross hematuria: Secondary | ICD-10-CM | POA: Diagnosis not present

## 2019-10-21 DIAGNOSIS — N201 Calculus of ureter: Secondary | ICD-10-CM | POA: Diagnosis not present

## 2019-10-23 ENCOUNTER — Other Ambulatory Visit: Payer: Self-pay

## 2019-10-23 ENCOUNTER — Other Ambulatory Visit: Payer: Self-pay | Admitting: General Practice

## 2019-10-23 MED ORDER — AMBULATORY NON FORMULARY MEDICATION: 1 refills | Status: DC

## 2019-10-28 ENCOUNTER — Telehealth: Payer: Self-pay | Admitting: Family Medicine

## 2019-10-28 NOTE — Telephone Encounter (Signed)
Please call pharmacy about the script for the T3/T4 12.5 mcg-63mcg

## 2019-10-29 NOTE — Telephone Encounter (Signed)
Refill authorization refaxed again today.

## 2019-11-10 ENCOUNTER — Telehealth: Payer: Self-pay | Admitting: *Deleted

## 2019-11-10 NOTE — Telephone Encounter (Signed)
Called and advised pt. Stated an understanding.

## 2019-11-10 NOTE — Telephone Encounter (Signed)
I'm glad that she is starting to feel better.  Continue to drink fluids, change positions slowly, ginger ale for nausea, and she can take OTC Meclizine as needed for dizziness.

## 2019-11-10 NOTE — Telephone Encounter (Signed)
Pt stated had vertigo and nausea, requesting a call back

## 2019-11-10 NOTE — Telephone Encounter (Signed)
Called and spoke with pt. She advised that she developed vertigo last night when she "shook her head" to get the water out of her ears after her shower last night.   States that she has been sick but she is feeling a little better today. She is drinking ginger-ale for the nausea. Please advise.   Pt uses [facility] [address]

## 2019-11-17 ENCOUNTER — Other Ambulatory Visit: Payer: Self-pay | Admitting: Family Medicine

## 2019-11-17 LAB — HM MAMMOGRAPHY

## 2019-11-25 ENCOUNTER — Other Ambulatory Visit: Payer: Self-pay | Admitting: Urology

## 2019-11-25 ENCOUNTER — Encounter: Payer: Self-pay | Admitting: General Practice

## 2019-11-28 ENCOUNTER — Other Ambulatory Visit: Payer: Self-pay

## 2019-11-28 ENCOUNTER — Encounter (HOSPITAL_BASED_OUTPATIENT_CLINIC_OR_DEPARTMENT_OTHER): Payer: Self-pay | Admitting: Urology

## 2019-11-28 NOTE — Progress Notes (Signed)
Spoke w/ via phone for pre-op interview---[facility] needs dos----   I stat 8           Lab results------ COVID test ------11-29-2019 Arrive at -------1315 pm 12-03-2019 NPO after ------midnight food, clear liquids until 915 am then npo Medications to take morning of surgery -----t3 t 4 med, albuterol inahel prn/bring inhaler Diabetic medication -----n/a Patient Special Instructions ----- Pre-Op special Istructions ----- Patient verbalized understanding of instructions that were given at this phone interview. Patient denies shortness of breath, chest pain, fever, cough a this phone interview.

## 2019-11-29 ENCOUNTER — Other Ambulatory Visit (HOSPITAL_COMMUNITY)
Admission: RE | Admit: 2019-11-29 | Discharge: 2019-11-29 | Disposition: A | Discharge status: Home / Self Care | Payer: Medicare Other | Source: Ambulatory Visit | Attending: Urology | Admitting: Urology

## 2019-11-29 DIAGNOSIS — Z20822 Contact with and (suspected) exposure to covid-19: Secondary | ICD-10-CM | POA: Insufficient documentation

## 2019-11-29 DIAGNOSIS — Z01812 Encounter for preprocedural laboratory examination: Secondary | ICD-10-CM | POA: Insufficient documentation

## 2019-11-30 LAB — NOVEL CORONAVIRUS, NAA (HOSP ORDER, SEND-OUT TO REF LAB; TAT 18-24 HRS): SARS-CoV-2, NAA: NOT DETECTED

## 2019-12-02 NOTE — Progress Notes (Signed)
Noted pt surgery time change for tomorrow 12-03-2019 , start at 1100.  Called and spoke w/ pt via phone, pt verbalized understanding to arrive at 0900 and be npo after mn, only sip of water with medication.

## 2019-12-03 ENCOUNTER — Encounter (HOSPITAL_BASED_OUTPATIENT_CLINIC_OR_DEPARTMENT_OTHER)
Admission: RE | Disposition: A | Discharge status: Home / Self Care | Payer: Self-pay | Source: Home / Self Care | Attending: Urology

## 2019-12-03 ENCOUNTER — Ambulatory Visit (HOSPITAL_BASED_OUTPATIENT_CLINIC_OR_DEPARTMENT_OTHER): Payer: Medicare Other | Admitting: Anesthesiology

## 2019-12-03 ENCOUNTER — Encounter (HOSPITAL_BASED_OUTPATIENT_CLINIC_OR_DEPARTMENT_OTHER): Payer: Self-pay | Admitting: Urology

## 2019-12-03 ENCOUNTER — Ambulatory Visit (HOSPITAL_BASED_OUTPATIENT_CLINIC_OR_DEPARTMENT_OTHER)
Admission: RE | Admit: 2019-12-03 | Discharge: 2019-12-03 | Disposition: A | Discharge status: Home / Self Care | Payer: Medicare Other | Attending: Urology | Admitting: Urology

## 2019-12-03 DIAGNOSIS — Z791 Long term (current) use of non-steroidal anti-inflammatories (NSAID): Secondary | ICD-10-CM | POA: Insufficient documentation

## 2019-12-03 DIAGNOSIS — Z79899 Other long term (current) drug therapy: Secondary | ICD-10-CM | POA: Insufficient documentation

## 2019-12-03 DIAGNOSIS — E663 Overweight: Secondary | ICD-10-CM | POA: Diagnosis not present

## 2019-12-03 DIAGNOSIS — E039 Hypothyroidism, unspecified: Secondary | ICD-10-CM | POA: Insufficient documentation

## 2019-12-03 DIAGNOSIS — E785 Hyperlipidemia, unspecified: Secondary | ICD-10-CM | POA: Diagnosis not present

## 2019-12-03 DIAGNOSIS — M19041 Primary osteoarthritis, right hand: Secondary | ICD-10-CM | POA: Insufficient documentation

## 2019-12-03 DIAGNOSIS — G43909 Migraine, unspecified, not intractable, without status migrainosus: Secondary | ICD-10-CM | POA: Diagnosis not present

## 2019-12-03 DIAGNOSIS — N202 Calculus of kidney with calculus of ureter: Secondary | ICD-10-CM | POA: Diagnosis not present

## 2019-12-03 DIAGNOSIS — M19042 Primary osteoarthritis, left hand: Secondary | ICD-10-CM | POA: Insufficient documentation

## 2019-12-03 DIAGNOSIS — Z87442 Personal history of urinary calculi: Secondary | ICD-10-CM | POA: Insufficient documentation

## 2019-12-03 DIAGNOSIS — Z6824 Body mass index (BMI) 24.0-24.9, adult: Secondary | ICD-10-CM | POA: Diagnosis not present

## 2019-12-03 DIAGNOSIS — N201 Calculus of ureter: Secondary | ICD-10-CM | POA: Insufficient documentation

## 2019-12-03 HISTORY — PX: HOLMIUM LASER APPLICATION: SHX5852

## 2019-12-03 HISTORY — PX: CYSTOSCOPY WITH RETROGRADE PYELOGRAM, URETEROSCOPY AND STENT PLACEMENT: SHX5789

## 2019-12-03 HISTORY — DX: Personal history of urinary calculi: Z87.442

## 2019-12-03 LAB — POCT I-STAT, CHEM 8
BUN: 19 mg/dL (ref 8–23)
Calcium, Ion: 1.24 mmol/L (ref 1.15–1.40)
Chloride: 104 mmol/L (ref 98–111)
Creatinine, Ser: 0.7 mg/dL (ref 0.44–1.00)
Glucose, Bld: 92 mg/dL (ref 70–99)
HCT: 41 % (ref 36.0–46.0)
Hemoglobin: 13.9 g/dL (ref 12.0–15.0)
Potassium: 3.3 mmol/L — ABNORMAL LOW (ref 3.5–5.1)
Sodium: 141 mmol/L (ref 135–145)
TCO2: 24 mmol/L (ref 22–32)

## 2019-12-03 SURGERY — CYSTOURETEROSCOPY, WITH RETROGRADE PYELOGRAM AND STENT INSERTION
Anesthesia: General | Site: Pelvis | Laterality: Left

## 2019-12-03 MED ORDER — EPHEDRINE 5 MG/ML INJ
INTRAVENOUS | Status: AC
  Filled 2019-12-03: qty 10

## 2019-12-03 MED ORDER — FENTANYL CITRATE (PF) 100 MCG/2ML IJ SOLN
INTRAMUSCULAR | Status: AC
  Filled 2019-12-03: qty 2

## 2019-12-03 MED ORDER — OXYCODONE HCL 5 MG/5ML PO SOLN
5.0000 mg | Freq: Once | ORAL | Status: DC | PRN
  Filled 2019-12-03: qty 5

## 2019-12-03 MED ORDER — KETOROLAC TROMETHAMINE 10 MG PO TABS: 10.0000 mg | ORAL_TABLET | Freq: Three times a day (TID) | ORAL | 0 refills | Status: DC | PRN

## 2019-12-03 MED ORDER — ACETAMINOPHEN 325 MG PO TABS
ORAL_TABLET | ORAL | Status: DC | PRN
  Administered 2019-12-03: 1000 mg via ORAL

## 2019-12-03 MED ORDER — KETOROLAC TROMETHAMINE 30 MG/ML IJ SOLN
INTRAMUSCULAR | Status: DC | PRN
  Administered 2019-12-03: 30 mg via INTRAVENOUS

## 2019-12-03 MED ORDER — DOXYCYCLINE HYCLATE 50 MG PO CAPS: 100.0000 mg | ORAL_CAPSULE | Freq: Two times a day (BID) | ORAL | 0 refills | Status: DC

## 2019-12-03 MED ORDER — LIDOCAINE 2% (20 MG/ML) 5 ML SYRINGE
INTRAMUSCULAR | Status: DC | PRN
  Administered 2019-12-03: 60 mg via INTRAVENOUS

## 2019-12-03 MED ORDER — GENTAMICIN SULFATE 40 MG/ML IJ SOLN
5.0000 mg/kg | INTRAVENOUS | Status: DC
  Filled 2019-12-03 (×2): qty 8.75

## 2019-12-03 MED ORDER — EPHEDRINE SULFATE 50 MG/ML IJ SOLN
INTRAMUSCULAR | Status: DC | PRN
  Administered 2019-12-03: 10 mg via INTRAVENOUS

## 2019-12-03 MED ORDER — FENTANYL CITRATE (PF) 100 MCG/2ML IJ SOLN
INTRAMUSCULAR | Status: DC | PRN
  Administered 2019-12-03: 100 ug via INTRAVENOUS

## 2019-12-03 MED ORDER — PROMETHAZINE HCL 25 MG/ML IJ SOLN
6.2500 mg | INTRAMUSCULAR | Status: DC | PRN
  Filled 2019-12-03: qty 1

## 2019-12-03 MED ORDER — IOHEXOL 300 MG/ML  SOLN
INTRAMUSCULAR | Status: DC | PRN
  Administered 2019-12-03: 10 mL via URETHRAL

## 2019-12-03 MED ORDER — SODIUM CHLORIDE 0.9 % IR SOLN
Status: DC | PRN
  Administered 2019-12-03: 3000 mL

## 2019-12-03 MED ORDER — KETOROLAC TROMETHAMINE 30 MG/ML IJ SOLN
INTRAMUSCULAR | Status: AC
  Filled 2019-12-03: qty 1

## 2019-12-03 MED ORDER — LIDOCAINE 2% (20 MG/ML) 5 ML SYRINGE
INTRAMUSCULAR | Status: AC
  Filled 2019-12-03: qty 5

## 2019-12-03 MED ORDER — PROPOFOL 10 MG/ML IV BOLUS
INTRAVENOUS | Status: DC | PRN
  Administered 2019-12-03: 150 mg via INTRAVENOUS

## 2019-12-03 MED ORDER — OXYCODONE HCL 5 MG PO TABS
5.0000 mg | ORAL_TABLET | Freq: Once | ORAL | Status: DC | PRN
  Filled 2019-12-03: qty 1

## 2019-12-03 MED ORDER — ONDANSETRON HCL 4 MG/2ML IJ SOLN
INTRAMUSCULAR | Status: DC | PRN
  Administered 2019-12-03: 4 mg via INTRAVENOUS

## 2019-12-03 MED ORDER — MIDAZOLAM HCL 2 MG/2ML IJ SOLN
INTRAMUSCULAR | Status: AC
  Filled 2019-12-03: qty 2

## 2019-12-03 MED ORDER — OXYCODONE-ACETAMINOPHEN 5-325 MG PO TABS: 1.0000 | ORAL_TABLET | ORAL | 0 refills | Status: DC | PRN

## 2019-12-03 MED ORDER — DEXAMETHASONE SODIUM PHOSPHATE 10 MG/ML IJ SOLN
INTRAMUSCULAR | Status: DC | PRN
  Administered 2019-12-03: 5 mg via INTRAVENOUS

## 2019-12-03 MED ORDER — ARTIFICIAL TEARS OPHTHALMIC OINT
TOPICAL_OINTMENT | OPHTHALMIC | Status: AC
  Filled 2019-12-03: qty 3.5

## 2019-12-03 MED ORDER — PROPOFOL 10 MG/ML IV BOLUS
INTRAVENOUS | Status: AC
  Filled 2019-12-03: qty 40

## 2019-12-03 MED ORDER — ACETAMINOPHEN 500 MG PO TABS
ORAL_TABLET | ORAL | Status: AC
  Filled 2019-12-03: qty 2

## 2019-12-03 MED ORDER — DEXAMETHASONE SODIUM PHOSPHATE 10 MG/ML IJ SOLN
INTRAMUSCULAR | Status: AC
  Filled 2019-12-03: qty 1

## 2019-12-03 MED ORDER — MIDAZOLAM HCL 2 MG/2ML IJ SOLN
INTRAMUSCULAR | Status: DC | PRN
  Administered 2019-12-03: 2 mg via INTRAVENOUS

## 2019-12-03 MED ORDER — LACTATED RINGERS IV SOLN
INTRAVENOUS | Status: DC
  Administered 2019-12-03: 50 mL/h via INTRAVENOUS
  Filled 2019-12-03 (×2): qty 1000

## 2019-12-03 MED ORDER — FENTANYL CITRATE (PF) 100 MCG/2ML IJ SOLN
25.0000 ug | INTRAMUSCULAR | Status: DC | PRN
  Filled 2019-12-03: qty 1

## 2019-12-03 MED ORDER — ONDANSETRON HCL 4 MG/2ML IJ SOLN
INTRAMUSCULAR | Status: AC
  Filled 2019-12-03: qty 2

## 2019-12-03 SURGICAL SUPPLY — 25 items
BAG DRAIN URO-CYSTO SKYTR STRL (DRAIN) ×2 IMPLANT
BAG DRN UROCATH (DRAIN) ×1
BASKET LASER NITINOL 1.9FR (BASKET) ×1 IMPLANT
BSKT STON RTRVL 120 1.9FR (BASKET) ×1
CATH INTERMIT  6FR 70CM (CATHETERS) ×1 IMPLANT
CLOTH BEACON ORANGE TIMEOUT ST (SAFETY) ×1 IMPLANT
FIBER LASER FLEXIVA 365 (UROLOGICAL SUPPLIES) IMPLANT
FIBER LASER TRAC TIP (UROLOGICAL SUPPLIES) ×1 IMPLANT
GLOVE BIO SURGEON STRL SZ7.5 (GLOVE) ×2 IMPLANT
GOWN STRL REUS W/TWL LRG LVL3 (GOWN DISPOSABLE) ×2 IMPLANT
GUIDEWIRE ANG ZIPWIRE 038X150 (WIRE) ×2 IMPLANT
GUIDEWIRE STR DUAL SENSOR (WIRE) ×2 IMPLANT
IV NS 1000ML (IV SOLUTION) ×2
IV NS 1000ML BAXH (IV SOLUTION) ×1 IMPLANT
IV NS IRRIG 3000ML ARTHROMATIC (IV SOLUTION) ×1 IMPLANT
KIT TURNOVER CYSTO (KITS) ×2 IMPLANT
MANIFOLD NEPTUNE II (INSTRUMENTS) ×2 IMPLANT
NS IRRIG 500ML POUR BTL (IV SOLUTION) ×2 IMPLANT
PACK CYSTO (CUSTOM PROCEDURE TRAY) ×2 IMPLANT
SHEATH URETERAL 12FRX28CM (UROLOGICAL SUPPLIES) ×1 IMPLANT
STENT POLARIS 5FRX22 (STENTS) ×1 IMPLANT
SYR 10ML LL (SYRINGE) ×2 IMPLANT
TUBE CONNECTING 12X1/4 (SUCTIONS) ×2 IMPLANT
TUBE FEEDING 8FR 16IN STR KANG (MISCELLANEOUS) ×1 IMPLANT
TUBING UROLOGY SET (TUBING) ×2 IMPLANT

## 2019-12-03 NOTE — Anesthesia Preprocedure Evaluation (Addendum)
Anesthesia Evaluation  Patient identified by MRN, date of birth, ID band Patient awake    Reviewed: Allergy & Precautions, NPO status , Patient's Chart, lab work & pertinent test results  Airway Mallampati: III  TM Distance: <3 FB Neck ROM: Full    Dental no notable dental hx. (+) Teeth Intact, Dental Advisory Given, Caps,    Pulmonary neg pulmonary ROS,    Pulmonary exam normal breath sounds clear to auscultation       Cardiovascular negative cardio ROS Normal cardiovascular exam Rhythm:Regular Rate:Normal     Neuro/Psych negative neurological ROS  negative psych ROS   GI/Hepatic negative GI ROS, Neg liver ROS,   Endo/Other  Hypothyroidism   Renal/GU negative Renal ROS  negative genitourinary   Musculoskeletal negative musculoskeletal ROS (+)   Abdominal   Peds negative pediatric ROS (+)  Hematology negative hematology ROS (+)   Anesthesia Other Findings   Reproductive/Obstetrics negative OB ROS                            Anesthesia Physical Anesthesia Plan  ASA: II  Anesthesia Plan: General   Post-op Pain Management:    Induction: Intravenous  PONV Risk Score and Plan: 3 and Ondansetron, Dexamethasone and Treatment may vary due to age or medical condition  Airway Management Planned: LMA  Additional Equipment:   Intra-op Plan:   Post-operative Plan: Extubation in OR  Informed Consent: I have reviewed the patients History and Physical, chart, labs and discussed the procedure including the risks, benefits and alternatives for the proposed anesthesia with the patient or authorized representative who has indicated his/her understanding and acceptance.     Dental advisory given  Plan Discussed with: CRNA and Surgeon  Anesthesia Plan Comments:         Anesthesia Quick Evaluation

## 2019-12-03 NOTE — Op Note (Signed)
NAME: Kristen, [name] MEDICAL RECORD [id number] ACCOUNT [id number] DATE OF BIRTH:30-Nov-1950 FACILITY: [facility] LOCATION: [facility] PHYSICIAN:Quint Chestnut Berneice Heinrich, MD  OPERATIVE REPORT  DATE OF PROCEDURE:  12/03/2019  SURGEON:  Sebastian Ache MD  PREOPERATIVE DIAGNOSIS:  Left ureteral stone with refractory colic.  PROCEDURE: 1.  Cystoscopy, left retrograde pyelogram with interpretation. 2.  Left ureteroscopy with laser lithotripsy. 3.  Insertion of left ureteral stent, 5 x 22 Polaris with tether.  ESTIMATED BLOOD LOSS:  Nil.  COMPLICATIONS:  None.  SPECIMENS:  Left ureteral and renal stone fragments given to patient.  FINDINGS:    1.  Antegrade progression of left ureteral stone to midureter. 2.  Complete resolution of all accessible stone fragments in the left kidney and ureter larger than one-third mm following laser lithotripsy and basket extraction. 3.  Successful placement of left ureteral stent, proximal end in renal pelvis, distal end in urinary bladder.  INDICATIONS:  The patient is a delightful 69 year old woman with history of recurrent urolithiasis.  She was found on workup for colicky flank pain and recurrent hematuria to have a left UPJ stone with likely intermittent ball-valving.  The stone was  quite dense at over 1000 Hounsfield units.  Options were discussed for management including medical therapy versus shockwave lithotripsy versus ureteroscopy and we agreed on the latter with the goal of stone free.  She presents for this today.  Informed  consent was then obtained and placed in the medical record.  DESCRIPTION OF PROCEDURE:  The patient being identified, the procedure being left ureteroscopic stimulation was confirmed.  Procedure timeout was performed.  Intravenous antibiotics were administered.  General LMA anesthesia induced.  The patient was  placed into a low lithotomy position.  A sterile field was created, prepping and draping the patient's vagina,  introitus and proximal thighs using iodine.  Cystourethroscopy was performed using 21-French rigid cystoscope with offset lens.  Inspection of  bladder revealed no diverticula, calcifications, palpable lesions.  Ureteral orifices appeared singleton.  The left ureteral orifice was cannulated with a 6-French renal catheter and left retrograde pyelogram was obtained.  Left retrograde pyelogram demonstrated a single left ureter with a single system left kidney.  There was a filling defect in the midureter consistent with known stone with likely antegrade progression.  A 0.03 ZIPwire was advanced to the lower pole and  set aside as a safety wire.  An 8-French feeding tube was placed in the urinary bladder for pressure release and semirigid ureteroscopy performed the distal left ureter alongside a separate sensor working wire.  As suspected, left midureteral stone was  encountered.  It was much too large for simple basketing.  Holmium laser energy was then applied to the stone using settings of 0.3 joules and 30 Hz.  The stone was fragmented into approximately 4 smaller pieces.  Two of these were amenable to simple  basketing.  They were removed, set aside to be given to patient.  Two additional fragments retrograde positioned towards the kidney and as such, the semirigid scope was exchanged for a 12/14 short length ureteral access sheath to the level of the  proximal ureter using continuous fluoroscopic guidance and flexible digital ureteroscopy was performed the left proximal ureter and systematic inspection of the left kidney, including all calices x3.  As expected, there were several retrograde positioned  stone fragments from the prior ureteral stone.  These were also amenable to simple basketing.  They were removed and set aside.  Following this, complete resolution of all accessible stone fragments  larger than one-third mm.  There was excellent  hemostasis.  No evidence of renal perforation.  The access  sheath was removed under continuous vision.  There was mild mucosal edema at the site of prior stone impaction in the midureter.  It was felt that brief interval stenting with a tethered stent be  would  warranted.  As such, a new 5 x 22 Polaris-type stent was placed with remaining safety wire using fluoroscopic guidance.  Good proximal and distal planes were noted.  The tether string was tucked per vagina and the procedure was terminated.  The  patient tolerated the procedure well.  No immediate apparent complications.  The patient was taken to the postanesthesia care unit in stable condition.  Plan for discharge home.  VN/NUANCE  D:12/03/2019 T:12/03/2019 JOB:[id number]

## 2019-12-03 NOTE — Discharge Instructions (Signed)
1 - You may have urinary urgency (bladder spasms) and bloody urine on / off with stent in place. This is normal.  2 - Remove tethered stent on Friday morning at home by pulling on string, then blue-white plastic tubing, and discarding. Office is open Friday if problems arise.   3 - Call MD or go to ER for fever >102, severe pain / nausea / vomiting not relieved by medications, or acute change in medical status  [facility] [phone] Post Ureteroscopy With or Without Stent Instructions  Definitions:  Ureter: The duct that transports urine from the kidney to the bladder. Stent:   A plastic hollow tube that is placed into the ureter, from the kidney to the                 bladder to prevent the ureter from swelling shut.  GENERAL INSTRUCTIONS:  Despite the fact that no skin incisions were used, the area around the ureter and bladder is raw and irritated. The stent is a foreign body which will further irritate the bladder wall. This irritation is manifested by increased frequency of urination, both day and night, and by an increase in the urge to urinate. In some, the urge to urinate is present almost always. Sometimes the urge is strong enough that you may not be able to stop yourself from urinating. The only real cure is to remove the stent and then give time for the bladder wall to heal which can't be done until the danger of the ureter swelling shut has passed, which varies.  You may see some blood in your urine while the stent is in place and a few days afterwards. Do not be alarmed, even if the urine was clear for a while. Get off your feet and drink lots of fluids until clearing occurs. If you start to pass clots or don't improve, call us.  DIET: You may return to your normal diet immediately. Because of the raw surface of your bladder, alcohol, spicy foods, acid type foods and drinks with caffeine may cause irritation or frequency and should be used in moderation. To  keep your urine flowing freely and to avoid constipation, drink plenty of fluids during the day ( 8-10 glasses ). Tip: Avoid cranberry juice because it is very acidic.  ACTIVITY: Your physical activity doesn't need to be restricted. However, if you are very active, you may see some blood in your urine. We suggest that you reduce your activity under these circumstances until the bleeding has stopped.  BOWELS: It is important to keep your bowels regular during the postoperative period. Straining with bowel movements can cause bleeding. A bowel movement every other day is reasonable. Use a mild laxative if needed, such as Milk of Magnesia 2-3 tablespoons, or 2 Dulcolax tablets. Call if you continue to have problems. If you have been taking narcotics for pain, before, during or after your surgery, you may be constipated. Take a laxative if necessary.   MEDICATION: You should resume your pre-surgery medications unless told not to. In addition you will often be given an antibiotic to prevent infection. These should be taken as prescribed until the bottles are finished unless you are having an unusual reaction to one of the drugs.  PROBLEMS YOU SHOULD REPORT TO [place]:  Fevers over 100.5 Fahrenheit.  Heavy bleeding, or clots ( See above notes about blood in urine ).  Inability to urinate.  Drug reactions ( hives, rash, nausea, vomiting, diarrhea ).  Severe  burning or pain with urination that is not improving.  FOLLOW-UP: You will need a follow-up appointment to monitor your progress. Call for this appointment at the number listed above. Usually the first appointment will be about three to fourteen days after your surgery.    Post Anesthesia Home Care Instructions  Activity: Get plenty of rest for the remainder of the day. A responsible adult should stay with you for 24 hours following the procedure.  For the next 24 hours, DO NOT: -Drive a car -Paediatric nurse -Drink alcoholic  beverages -Take any medication unless instructed by your physician -Make any legal decisions or sign important papers.  Meals: Start with liquid foods such as gelatin or soup. Progress to regular foods as tolerated. Avoid greasy, spicy, heavy foods. If nausea and/or vomiting occur, drink only clear liquids until the nausea and/or vomiting subsides. Call your physician if vomiting continues.  Special Instructions/Symptoms: Your throat may feel dry or sore from the anesthesia or the breathing tube placed in your throat during surgery. If this causes discomfort, gargle with warm salt water. The discomfort should disappear within 24 hours.  If you had a scopolamine patch placed behind your ear for the management of post- operative nausea and/or vomiting:  1. The medication in the patch is effective for 72 hours, after which it should be removed.  Wrap patch in a tissue and discard in the trash. Wash hands thoroughly with soap and water. 2. You may remove the patch earlier than 72 hours if you experience unpleasant side effects which may include dry mouth, dizziness or visual disturbances. 3. Avoid touching the patch. Wash your hands with soap and water after contact with the patch.

## 2019-12-03 NOTE — Anesthesia Procedure Notes (Signed)
Procedure Name: LMA Insertion Date/Time: 12/03/2019 10:52 AM Performed by: Wanita Chamberlain, CRNA Pre-anesthesia Checklist: Patient identified, Timeout performed, Emergency Drugs available, Suction available and Patient being monitored Patient Re-evaluated:Patient Re-evaluated prior to induction Oxygen Delivery Method: Circle system utilized Preoxygenation: Pre-oxygenation with 100% oxygen Induction Type: IV induction Ventilation: Mask ventilation without difficulty LMA: LMA inserted LMA Size: 4.0 Number of attempts: 1 Airway Equipment and Method: Bite block Placement Confirmation: CO2 detector and positive ETCO2 Dental Injury: Teeth and Oropharynx as per pre-operative assessment

## 2019-12-03 NOTE — Brief Op Note (Signed)
12/03/2019  11:28 AM  PATIENT:  [name]  69 y.o. female  PRE-OPERATIVE DIAGNOSIS:  LEFT PROXIMAL URETERAL STONE  POST-OPERATIVE DIAGNOSIS:  LEFT PROXIMAL URETERAL STONE  PROCEDURE:  Procedure(s) with comments: CYSTOSCOPY WITH RETROGRADE PYELOGRAM, URETEROSCOPY AND STENT PLACEMENT (Left) - 1 HR HOLMIUM LASER APPLICATION (Left)  SURGEON:  Surgeon(s) and Role:    Alexis Frock, MD - Primary  PHYSICIAN ASSISTANT:   ASSISTANTS: none   ANESTHESIA:   general  EBL:  minimal   BLOOD ADMINISTERED:none  DRAINS: none   LOCAL MEDICATIONS USED:  NONE  SPECIMEN:  Source of Specimen:  left ureteral / renal stone fragments  DISPOSITION OF SPECIMEN:  given to patient  COUNTS:  YES  TOURNIQUET:  * No tourniquets in log *  DICTATION: .Other Dictation: Dictation Number [id number]  PLAN OF CARE: Discharge to home after PACU  PATIENT DISPOSITION:  PACU - hemodynamically stable.   Delay start of Pharmacological VTE agent (>24hrs) due to surgical blood loss or risk of bleeding: yes

## 2019-12-03 NOTE — H&P (Signed)
Kristen [name] is an 69 y.o. female.    Chief Complaint: Pre-Op LEFT Ureteroscopic Stone Manipulation  HPI:   1 - Recurrent Urolithiasis -  Pre 2020 - SWL, URS  10/2019 - Left 45mm UPJ stone minimal hydro (solitary, SSD 10cm, 1040HU).   PMH sig for TNA, hypothyroid.  Her PCP is [name] MD. She is Dr. Flonnie Hailstone neighbor.    Today " Kristen [name]" is seen to proceed with LEFT ureteroscopy for left stone. No interval fevers. Most recent UA without infectious parameters. C19 screen negative.     Past Medical History:  Diagnosis Date  . Arthritis    In hands  . Hearing aid worn    Bil  . History of kidney stones   . History of shingles 2007   Shingles shot current  . Hypothyroid   . Kidney stones, calcium oxalate    Lithrotripsy x1; surgical removal X1  via cystoscopy  . Migraine    menopausal  . Pneumonia 4-5- yrs ago   "walking pneumonia"    Past Surgical History:  Procedure Laterality Date  . COLONOSCOPY     negative ; initially age 60; Dr Olevia Perches  . CYSTOSCOPY     X 2; Dr Serita Butcher  . FOOT SURGERY  2012   great toe   . LITHOTRIPSY     X1  . MOUTH SURGERY     extra bone removed in mouth 30 years ago  . TONSILLECTOMY AND ADENOIDECTOMY    . WISDOM TOOTH EXTRACTION      Family History  Problem Relation Age of Onset  . Dementia Mother   . COPD Mother   . Lung cancer Mother        smoker  . Cancer Father        Colon and Bladder cancer  . Diabetes Father   . Hypertension Father   . Colon cancer Father   . Cancer Paternal Grandfather        prostate/ colon  . Colon cancer Paternal Grandfather   . Heart failure Paternal Grandmother   . Stroke Paternal Grandmother 87  . Melanoma Maternal Grandmother   . Diabetes Brother    Social History:  reports that she has never smoked. She has never used smokeless tobacco. She reports previous alcohol use. She reports that she does not use drugs.  Allergies:  Allergies  Allergen Reactions  . Gantrisin  [Sulfisoxazole]     Urticaria with sun exposure   . Penicillins     REACTION: as child; ? hives  . Red Yeast Rice [Cholestin] Hives  . Sulfa Antibiotics     Reaction not definite/ some hives    No medications prior to admission.    No results found for this or any previous visit (from the past 48 hour(s)). No results found.  Review of Systems  Constitutional: Negative for chills and fever.  Genitourinary: Positive for flank pain.  All other systems reviewed and are negative.   Height 5\' 5"  (1.651 m), weight 65.8 kg. Physical Exam  Constitutional: She appears well-developed.  HENT:  Head: Normocephalic.  Eyes: Pupils are equal, round, and reactive to light.  Cardiovascular: Normal rate.  Respiratory: Effort normal.  GI: Soft.  Genitourinary:    Genitourinary Comments: Mild left CVAT at present.    Musculoskeletal:     Cervical back: Normal range of motion.  Neurological: She is alert.  Skin: Skin is warm.  Psychiatric: She has a normal mood and affect.     Assessment/Plan  Proceed as planned with LEFT ureteroscopic stone manipulation with goal of stone free. Risks, benefits, alternatives, expected peri-op course discussed previously and reiterated today.   Alexis Frock, MD 12/03/2019, 6:27 AM

## 2019-12-03 NOTE — Transfer of Care (Signed)
Immediate Anesthesia Transfer of Care Note  Patient: Kristen [name]  Procedure(s) Performed: CYSTOSCOPY WITH RETROGRADE PYELOGRAM, URETEROSCOPY AND STENT PLACEMENT (Left Pelvis) HOLMIUM LASER APPLICATION (Left Pelvis)  Patient Location: PACU  Anesthesia Type:General  Level of Consciousness: awake, alert , oriented and patient cooperative  Airway & Oxygen Therapy: Patient Spontanous Breathing and Patient connected to nasal cannula oxygen  Post-op Assessment: Report given to RN and Post -op Vital signs reviewed and stable  Post vital signs: Reviewed and stable  Last Vitals:  Vitals Value Taken Time  BP 126/58 12/03/19 1143  Temp    Pulse 69 12/03/19 1143  Resp 11 12/03/19 1143  SpO2 100 % 12/03/19 1143  Vitals shown include unvalidated device data.  Last Pain:  Vitals:   12/03/19 0947  TempSrc: Oral  PainSc: 0-No pain      Patients Stated Pain Goal: 8 ([id number])  Complications: No apparent anesthesia complications

## 2019-12-04 NOTE — Anesthesia Postprocedure Evaluation (Signed)
Anesthesia Post Note  Patient: Kristen [name]  Procedure(s) Performed: CYSTOSCOPY WITH RETROGRADE PYELOGRAM, URETEROSCOPY AND STENT PLACEMENT (Left Pelvis) HOLMIUM LASER APPLICATION (Left Pelvis)     Patient location during evaluation: PACU Anesthesia Type: General Level of consciousness: awake and alert Pain management: pain level controlled Vital Signs Assessment: post-procedure vital signs reviewed and stable Respiratory status: spontaneous breathing, nonlabored ventilation, respiratory function stable and patient connected to nasal cannula oxygen Cardiovascular status: blood pressure returned to baseline and stable Postop Assessment: no apparent nausea or vomiting Anesthetic complications: no    Last Vitals:  Vitals:   12/03/19 1215 12/03/19 1315  BP: 120/61 (!) 145/63  Pulse: (!) 59 71  Resp: 15 18  Temp:  36.6 C  SpO2: 96% 98%    Last Pain:  Vitals:   12/03/19 1315  TempSrc:   PainSc: 2                  Jeffory Snelgrove S

## 2019-12-05 ENCOUNTER — Ambulatory Visit: Payer: Medicare Other

## 2019-12-10 ENCOUNTER — Ambulatory Visit: Payer: Medicare Other

## 2019-12-16 DIAGNOSIS — N200 Calculus of kidney: Secondary | ICD-10-CM | POA: Diagnosis not present

## 2019-12-18 ENCOUNTER — Ambulatory Visit: Payer: Medicare Other | Attending: Internal Medicine

## 2019-12-18 DIAGNOSIS — Z23 Encounter for immunization: Secondary | ICD-10-CM

## 2019-12-18 NOTE — Progress Notes (Signed)
   Covid-19 Vaccination Clinic  Name:  NECOLE BUEN    MRN: [id number] DOB: April 17, 1951  12/18/2019  Ms. Nerison was observed post Covid-19 immunization for 15 minutes without incidence. She was provided with Vaccine Information Sheet and instruction to access the V-Safe system.   Ms. Brighton was instructed to call 911 with any severe reactions post vaccine: Marland Kitchen Difficulty breathing  . Swelling of your face and throat  . A fast heartbeat  . A bad rash all over your body  . Dizziness and weakness    Immunizations Administered    Name Date Dose VIS Date Route   Pfizer COVID-19 Vaccine 12/18/2019  9:12 AM 0.3 mL 10/24/2019 Intramuscular   Manufacturer: [place]   Lot: [id number]   [place]: [id number]

## 2019-12-28 ENCOUNTER — Ambulatory Visit: Payer: Medicare Other

## 2020-01-12 ENCOUNTER — Ambulatory Visit: Payer: Medicare Other | Attending: Internal Medicine

## 2020-01-12 DIAGNOSIS — Z23 Encounter for immunization: Secondary | ICD-10-CM | POA: Insufficient documentation

## 2020-01-12 NOTE — Progress Notes (Signed)
   Covid-19 Vaccination Clinic  Name:  Kristen [name]    MRN: [id number] DOB: [birth year]  01/12/2020  [name] was observed post Covid-19 immunization for 15 minutes without incidence. She was provided with Vaccine Information Sheet and instruction to access the V-Safe system.   [name] was instructed to call 911 with any severe reactions post vaccine: Marland Kitchen Difficulty breathing  . Swelling of your face and throat  . A fast heartbeat  . A bad rash all over your body  . Dizziness and weakness    Immunizations Administered    Name Date Dose VIS Date Route   Pfizer COVID-19 Vaccine 01/12/2020  1:48 PM 0.3 mL 10/24/2019 Intramuscular   Manufacturer: [place]   Lot: [id number]   [place]: [id number]

## 2020-02-09 ENCOUNTER — Other Ambulatory Visit: Payer: Self-pay | Admitting: Family Medicine

## 2020-02-16 ENCOUNTER — Telehealth: Payer: Self-pay | Admitting: Family Medicine

## 2020-02-16 NOTE — Progress Notes (Signed)
  Chronic Care Management   Outreach Note  02/16/2020 Name: Kristen [name] MRN: [id number] DOB: [birth year]  Referred by: Midge Minium, MD Reason for referral : No chief complaint on file.   An unsuccessful telephone outreach was attempted today. The patient was referred to the pharmacist for assistance with care management and care coordination.   Follow Up Plan:   Earney Hamburg Upstream Scheduler

## 2020-02-18 DIAGNOSIS — H1045 Other chronic allergic conjunctivitis: Secondary | ICD-10-CM | POA: Diagnosis not present

## 2020-02-18 DIAGNOSIS — H1033 Unspecified acute conjunctivitis, bilateral: Secondary | ICD-10-CM | POA: Diagnosis not present

## 2020-02-18 DIAGNOSIS — H04123 Dry eye syndrome of bilateral lacrimal glands: Secondary | ICD-10-CM | POA: Diagnosis not present

## 2020-02-20 ENCOUNTER — Telehealth: Payer: Self-pay | Admitting: Family Medicine

## 2020-02-20 NOTE — Progress Notes (Signed)
  Chronic Care Management   Outreach Note  02/20/2020 Name: Kristen [name] MRN: [id number] DOB: [birth year]  Referred by: Midge Minium, MD Reason for referral : No chief complaint on file.   A second unsuccessful telephone outreach was attempted today. The patient was referred to pharmacist for assistance with care management and care coordination.  Follow Up Plan:   Earney Hamburg Upstream Scheduler

## 2020-02-26 DIAGNOSIS — M545 Low back pain: Secondary | ICD-10-CM | POA: Diagnosis not present

## 2020-02-26 DIAGNOSIS — M47816 Spondylosis without myelopathy or radiculopathy, lumbar region: Secondary | ICD-10-CM | POA: Diagnosis not present

## 2020-03-02 DIAGNOSIS — M47816 Spondylosis without myelopathy or radiculopathy, lumbar region: Secondary | ICD-10-CM | POA: Diagnosis not present

## 2020-03-04 ENCOUNTER — Telehealth: Payer: Self-pay | Admitting: Family Medicine

## 2020-03-04 NOTE — Progress Notes (Signed)
°  Chronic Care Management   Outreach Note  03/04/2020 Name: Kristen [name] MRN: [id number] DOB: 1951/01/29  Referred by: Midge Minium, MD Reason for referral : No chief complaint on file.   An unsuccessful telephone outreach was attempted today. The patient was referred to the pharmacist for assistance with care management and care coordination.   Follow Up Plan:   Earney Hamburg Upstream Scheduler

## 2020-03-11 ENCOUNTER — Telehealth: Payer: Self-pay | Admitting: Family Medicine

## 2020-03-11 NOTE — Progress Notes (Signed)
  Chronic Care Management   Note  03/11/2020 Name: Kristen [name] MRN: [id number] DOB: [birth year]  Kristen [name] is a 69 y.o. year old female who is a primary care patient of Birdie Riddle, Aundra Millet, MD. I reached out to [name] by phone today in response to a referral sent by [name]'s PCP, Midge Minium, MD.   [name] was given information about Chronic Care Management services today including:  1. CCM service includes personalized support from designated clinical staff supervised by her physician, including individualized plan of care and coordination with other care providers 2. 24/7 contact phone numbers for assistance for urgent and routine care needs. 3. Service will only be billed when office clinical staff spend 20 minutes or more in a month to coordinate care. 4. Only one practitioner may furnish and bill the service in a calendar month. 5. The patient may stop CCM services at any time (effective at the end of the month) by phone call to the office staff.   Patient agreed to services and verbal consent obtained.   This note is not being shared with the patient for the following reason: To respect privacy (The patient or proxy has requested that the information not be shared).  Follow up plan:   Earney Hamburg Upstream Scheduler

## 2020-03-11 NOTE — Progress Notes (Signed)
  Chronic Care Management   Outreach Note  03/11/2020 Name: SAMARIA KERSCHER MRN: [id number] DOB: 1951-05-07  Referred by: Midge Minium, MD Reason for referral : No chief complaint on file.   An unsuccessful telephone outreach was attempted today. The patient was referred to the pharmacist for assistance with care management and care coordination.  This note is not being shared with the patient for the following reason: To respect privacy (The patient or proxy has requested that the information not be shared). Follow Up Plan:   Earney Hamburg Upstream Scheduler

## 2020-03-12 ENCOUNTER — Encounter: Payer: Self-pay | Admitting: Physician Assistant

## 2020-03-12 ENCOUNTER — Ambulatory Visit (INDEPENDENT_AMBULATORY_CARE_PROVIDER_SITE_OTHER): Payer: Medicare Other | Admitting: Physician Assistant

## 2020-03-12 ENCOUNTER — Other Ambulatory Visit: Payer: Self-pay

## 2020-03-12 ENCOUNTER — Other Ambulatory Visit: Payer: Self-pay | Admitting: Physician Assistant

## 2020-03-12 VITALS — BP 100/70 | HR 78 | Temp 98.5°F | Resp 16 | Ht 65.0 in | Wt 148.0 lb

## 2020-03-12 DIAGNOSIS — B889 Infestation, unspecified: Secondary | ICD-10-CM

## 2020-03-12 MED ORDER — ZOSTER VAC RECOMB ADJUVANTED 50 MCG/0.5ML IM SUSR: 0.5000 mL | Freq: Once | INTRAMUSCULAR | 0 refills | Status: AC

## 2020-03-12 MED ORDER — HYDROXYZINE HCL 10 MG PO TABS: 10.0000 mg | ORAL_TABLET | Freq: Three times a day (TID) | ORAL | 0 refills | Status: DC | PRN

## 2020-03-12 MED ORDER — PERMETHRIN 5 % EX CREA: 1.0000 "application " | TOPICAL_CREAM | Freq: Once | CUTANEOUS | 0 refills | Status: AC

## 2020-03-12 NOTE — Progress Notes (Signed)
Patient presents to clinic today c/o itch rash of lower abdomen/waistline bilaterally starting 4 days ago after working in the garden. Denies rash elsewhere. Denies any pain, burning or stinging. Worse at night. Has been using Rx triamcinolone to help with itch. Has history of shingles and wanting to make sure this is not a recurrence.    Past Medical History:  Diagnosis Date  . Arthritis    In hands  . Hearing aid worn    Bil  . History of kidney stones   . History of shingles 2007   Shingles shot current  . Hypothyroid   . Kidney stones, calcium oxalate    Lithrotripsy x1; surgical removal X1  via cystoscopy  . Migraine    menopausal  . Pneumonia 4-5- yrs ago   "walking pneumonia"    Current Outpatient Medications on File Prior to Visit  Medication Sig Dispense Refill  . albuterol (PROVENTIL HFA;VENTOLIN HFA) 108 (90 Base) MCG/ACT inhaler Inhale 2 puffs into the lungs every 6 (six) hours as needed for wheezing or shortness of breath. 1 Inhaler 2  . AMBULATORY NON FORMULARY MEDICATION Medication Name: T3/T4 12.5 mcg-25 mcg capsule. Take 1 capsule by mouth every morning 90 capsule 1  . Ascorbic Acid (VITAMIN C PO) Take 1 tablet by mouth 2 (two) times daily.     . butalbital-aspirin-caffeine (FIORINAL) 50-325-40 MG capsule TAKE ONE CAPSULE BY MOUTH EVERY 6 HOURS AS NEEDED FOR HEADACHE 30 capsule 1  . Calcium Citrate-Vitamin D (CALCIUM CITRATE + PO) Take by mouth. Two times daily     . GLUCOSAMINE PO Take 1 tablet by mouth 2 (two) times daily.    Marland Kitchen ketorolac (TORADOL) 10 MG tablet Take 1 tablet (10 mg total) by mouth every 8 (eight) hours as needed for moderate pain. Or stent discomfort post-operatively 20 tablet 0  . meloxicam (MOBIC) 15 MG tablet TAKE 1 TABLET BY MOUTH EVERY DAY 90 tablet 0  . Multiple Vitamin (MULTIVITAMIN) tablet Take 1 tablet by mouth daily. MinRX dietary supplement: 1 by mouth daily     . Omega-3 Fatty Acids (FISH OIL) 1200 MG CAPS Take by mouth 2 (two) times  daily.      Marland Kitchen oxyCODONE-acetaminophen (PERCOCET) 5-325 MG tablet Take 1 tablet by mouth every 4 (four) hours as needed for severe pain. Post-operatively 10 tablet 0  . Probiotic Product (PROBIOTIC DAILY PO) Take by mouth.    Marland Kitchen UNABLE TO FIND Med Name:indepamide 1.25 mg at hs     No current facility-administered medications on file prior to visit.    Allergies  Allergen Reactions  . Gantrisin [Sulfisoxazole]     Urticaria with sun exposure   . Penicillins     REACTION: as child; ? hives  . Red Yeast Rice [Cholestin] Hives  . Sulfa Antibiotics     Reaction not definite/ some hives    Family History  Problem Relation Age of Onset  . Dementia Mother   . COPD Mother   . Lung cancer Mother        smoker  . Cancer Father        Colon and Bladder cancer  . Diabetes Father   . Hypertension Father   . Colon cancer Father   . Cancer Paternal Grandfather        prostate/ colon  . Colon cancer Paternal Grandfather   . Heart failure Paternal Grandmother   . Stroke Paternal Grandmother 87  . Melanoma Maternal Grandmother   . Diabetes Brother  Social History   Socioeconomic History  . Marital status: Married    Spouse name: Not on file  . Number of children: Not on file  . Years of education: Not on file  . Highest education level: Not on file  Occupational History  . Not on file  Tobacco Use  . Smoking status: Never Smoker  . Smokeless tobacco: Never Used  Substance and Sexual Activity  . Alcohol use: Not Currently    Comment: quit 11-07-2019  . Drug use: No  . Sexual activity: Not Currently    Birth control/protection: Post-menopausal    Comment: 1st intercourse 21 yo-1 partner  Other Topics Concern  . Not on file  Social History Narrative  . Not on file   Social Determinants of Health   Financial Resource Strain:   . Difficulty of Paying Living Expenses:   Food Insecurity:   . Worried About Charity fundraiser in the Last Year:   . Arboriculturist in the  Last Year:   Transportation Needs:   . Film/video editor (Medical):   Marland Kitchen Lack of Transportation (Non-Medical):   Physical Activity:   . Days of Exercise per Week:   . Minutes of Exercise per Session:   Stress:   . Feeling of Stress :   Social Connections:   . Frequency of Communication with Friends and Family:   . Frequency of Social Gatherings with Friends and Family:   . Attends Religious Services:   . Active Member of Clubs or Organizations:   . Attends Archivist Meetings:   Marland Kitchen Marital Status:    Review of Systems - See HPI.  All other ROS are negative.  BP 100/70   Pulse 78   Temp 98.5 F (36.9 C) (Temporal)   Resp 16   Ht 5\' 5"  (1.651 m)   Wt 148 lb (67.1 kg)   SpO2 98%   BMI 24.63 kg/m   Physical Exam Vitals reviewed.  Constitutional:      Appearance: Normal appearance.  HENT:     Head: Normocephalic and atraumatic.  Cardiovascular:     Rate and Rhythm: Normal rate and regular rhythm.     Heart sounds: Normal heart sounds.  Pulmonary:     Effort: Pulmonary effort is normal.  Musculoskeletal:     Cervical back: Neck supple.  Skin:      Neurological:     Mental Status: She is alert.     Assessment/Plan: 1. Mite Bites Some elements concerning for focal scabies versus other mite. Waistline and groin only. No symptoms of contact dermatitis present. Reassured patient that this is not shingles as it would be a unilateral papulovesicular rash with hyperesthesia. Hygiene reviewed. Rx permethrin. Hydroxyzine for itch. Continue kenalog BID. Follow-up if not improving.  - permethrin (ELIMITE) 5 % cream; Apply 1 application topically once for 1 dose. Apply at bedtime. Rinse off first thing in the morning.  Dispense: 60 g; Refill: 0 - hydrOXYzine (ATARAX/VISTARIL) 10 MG tablet; Take 1 tablet (10 mg total) by mouth 3 (three) times daily as needed.  Dispense: 30 tablet; Refill: 0  This visit occurred during the SARS-CoV-2 public health emergency.  Safety  protocols were in place, including screening questions prior to the visit, additional usage of staff PPE, and extensive cleaning of exam room while observing appropriate contact time as indicated for disinfecting solutions.      Leeanne Rio, PA-C

## 2020-03-12 NOTE — Patient Instructions (Signed)
Please apply the Permethrin as directed at night x 1. Wash off the next morning. You can repeat this in a week if needed.   Take the hydroxyzine as directed when needed for itch. Continue the Triamcinolone cream. After the next 48 hours should should not have any new lesions. Will take 1-2 weeks for current lesions to fully resolve.  Hang in there!

## 2020-03-17 ENCOUNTER — Ambulatory Visit (INDEPENDENT_AMBULATORY_CARE_PROVIDER_SITE_OTHER): Payer: Medicare Other

## 2020-03-17 DIAGNOSIS — Z0000 Encounter for general adult medical examination without abnormal findings: Secondary | ICD-10-CM

## 2020-03-17 NOTE — Progress Notes (Signed)
This visit is being conducted via phone call due to the COVID-19 pandemic. This patient has given me verbal consent via phone to conduct this visit, patient states they are participating from their home address. Some vital signs may be absent or patient reported.   Patient identification: identified by name, DOB, and current address.  Location provider: [facility], Office Persons participating in the virtual visit: Denman George LPN, patient, and Dr. [name]   Subjective:   Kristen [name] is a 69 y.o. female who presents for Medicare Annual (Subsequent) preventive examination.  Review of Systems:   Cardiac Risk Factors include: advanced age (>37men, >79 women)    Objective:     Vitals: There were no vitals taken for this visit.  There is no height or weight on file to calculate BMI.  Advanced Directives 03/17/2020 12/03/2019 08/01/2018 01/25/2016  Does Patient Have a Medical Advance Directive? Yes Yes Yes Yes  Type of Advance Directive Living will;Healthcare Power of [place];Living will [place];Living will [place];Living will  Does patient want to make changes to medical advance directive? No - Patient declined - - -  Copy of [place] in Chart? Yes - validated most recent copy scanned in chart (See row information) Yes - validated most recent copy scanned in chart (See row information) No - copy requested -    Tobacco Social History   Tobacco Use  Smoking Status Never Smoker  Smokeless Tobacco Never Used     Counseling given: Not Answered   Clinical Intake:  Pre-visit preparation completed: Yes  Pain : No/denies pain  Diabetes: No  How often do you need to have someone help you when you read instructions, pamphlets, or other written materials from your doctor or pharmacy?: 1 - Never  Interpreter Needed?: No  Information entered by :: Denman George LPN  Past  Medical History:  Diagnosis Date  . Arthritis    In hands  . Hearing aid worn    Bil  . History of kidney stones   . History of shingles 2007   Shingles shot current  . Hypothyroid   . Kidney stones, calcium oxalate    Lithrotripsy x1; surgical removal X1  via cystoscopy  . Migraine    menopausal  . Pneumonia 4-5- yrs ago   "walking pneumonia"   Past Surgical History:  Procedure Laterality Date  . COLONOSCOPY     negative ; initially age 2; Dr Olevia Perches  . CYSTOSCOPY     X 2; Dr Serita Butcher  . CYSTOSCOPY WITH RETROGRADE PYELOGRAM, URETEROSCOPY AND STENT PLACEMENT Left 12/03/2019   Procedure: CYSTOSCOPY WITH RETROGRADE PYELOGRAM, URETEROSCOPY AND STENT PLACEMENT;  Surgeon: Alexis Frock, MD;  Location: [facility];  Service: Urology;  Laterality: Left;  1 HR  . FOOT SURGERY  2012   great toe   . HOLMIUM LASER APPLICATION Left [id number]   Procedure: HOLMIUM LASER APPLICATION;  Surgeon: Alexis Frock, MD;  Location: [facility];  Service: Urology;  Laterality: Left;  . LITHOTRIPSY     X1  . MOUTH SURGERY     extra bone removed in mouth 30 years ago  . TONSILLECTOMY AND ADENOIDECTOMY    . WISDOM TOOTH EXTRACTION     Family History  Problem Relation Age of Onset  . Dementia Mother   . COPD Mother   . Lung cancer Mother        smoker  . Cancer Father  Colon and Bladder cancer  . Diabetes Father   . Hypertension Father   . Colon cancer Father   . Cancer Paternal Grandfather        prostate/ colon  . Colon cancer Paternal Grandfather   . Heart failure Paternal Grandmother   . Stroke Paternal Grandmother 87  . Melanoma Maternal Grandmother   . Diabetes Brother    Social History   Socioeconomic History  . Marital status: Married    Spouse name: Not on file  . Number of children: 2  . Years of education: Not on file  . Highest education level: Not on file  Occupational History  . 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 Smoking status: Never Smoker  . Smokeless tobacco: Never Used  Substance and Sexual Activity  . Alcohol use: Not Currently    Comment: quit 11-07-2019  . Drug use: No  . Sexual activity: Not Currently    Birth control/protection: Post-menopausal    Comment: 1st intercourse 60 yo-1 partner  Other Topics Concern  . Not on file  Social History Narrative   4 Grandchildren    Hobbies: Gardening and Reading; Pension scheme manager    Social Determinants of Health   Financial Resource Strain:   . Difficulty of Paying Living Expenses:   Food Insecurity:   . Worried About Charity fundraiser in the Last Year:   . Arboriculturist in the Last Year:   Transportation Needs:   . Film/video editor (Medical):   Marland Kitchen Lack of Transportation (Non-Medical):   Physical Activity:   . Days of Exercise per Week:   . Minutes of Exercise per Session:   Stress:   . Feeling of Stress :   Social Connections:   . Frequency of Communication with Friends and Family:   . Frequency of Social Gatherings with Friends and Family:   . Attends Religious Services:   . Active Member of Clubs or Organizations:   . Attends Archivist Meetings:   Marland Kitchen Marital Status:     Outpatient Encounter Medications as of 03/17/2020  Medication Sig  . albuterol (PROVENTIL HFA;VENTOLIN HFA) 108 (90 Base) MCG/ACT inhaler Inhale 2 puffs into the lungs every 6 (six) hours as needed for wheezing or shortness of breath.  . AMBULATORY NON FORMULARY MEDICATION Medication Name: T3/T4 12.5 mcg-25 mcg capsule. Take 1 capsule by mouth every morning  . Ascorbic Acid (VITAMIN C PO) Take 1 tablet by mouth 2 (two) times daily.   . butalbital-aspirin-caffeine (FIORINAL) 50-325-40 MG capsule TAKE ONE CAPSULE BY MOUTH EVERY 6 HOURS AS NEEDED FOR HEADACHE  . Calcium Citrate-Vitamin D (CALCIUM CITRATE + PO) Take by mouth. Two times daily   . GLUCOSAMINE PO Take 1 tablet by mouth 2 (two)  times daily.  . hydrOXYzine (ATARAX/VISTARIL) 10 MG tablet Take 1 tablet (10 mg total) by mouth 3 (three) times daily as needed.  Marland Kitchen ketorolac (TORADOL) 10 MG tablet Take 1 tablet (10 mg total) by mouth every 8 (eight) hours as needed for moderate pain. Or stent discomfort post-operatively  . meloxicam (MOBIC) 15 MG tablet TAKE 1 TABLET BY MOUTH EVERY DAY  . Multiple Vitamin (MULTIVITAMIN) tablet Take 1 tablet by mouth daily. MinRX dietary supplement: 1 by mouth daily   . Omega-3 Fatty Acids (FISH OIL) 1200 MG CAPS Take by mouth 2 (two) times daily.    Marland Kitchen oxyCODONE-acetaminophen (PERCOCET) 5-325 MG tablet Take 1  tablet by mouth every 4 (four) hours as needed for severe pain. Post-operatively  . permethrin (ELIMITE) 5 % cream   . Probiotic Product (PROBIOTIC DAILY PO) Take by mouth.  Marland Kitchen UNABLE TO FIND Med Name:indepamide 1.25 mg at hs   No facility-administered encounter medications on file as of 03/17/2020.    Activities of Daily Living In your present state of health, do you have any difficulty performing the following activities: 03/17/2020 12/03/2019  Hearing? N Y  Comment - bil hearing aids inplace  Vision? N N  Difficulty concentrating or making decisions? N N  Walking or climbing stairs? N N  Dressing or bathing? N N  Doing errands, shopping? N -  Preparing Food and eating ? N -  Using the Toilet? N -  In the past six months, have you accidently leaked urine? N -  Do you have problems with loss of bowel control? N -  Managing your Medications? N -  Managing your Finances? N -  Housekeeping or managing your Housekeeping? N -  Some recent data might be hidden    Patient Care Team: Midge Minium, MD as PCP - General (Family Medicine) Armbruster, Carlota Raspberry, MD as Consulting Physician (Gastroenterology) Phineas Real, Belinda Block, MD as Consulting Physician (Gynecology) Zonia Kief, MD (Rehabilitation) Marica Otter, [place] (Optometry) Minor, Scott (Dentistry) Germaine Pomfret, [facility]  as Pharmacist (Pharmacist) Alexis Frock, MD as Consulting Physician (Urology)    Assessment:   This is a routine wellness examination for [place].  Exercise Activities and Dietary recommendations Current Exercise Habits: Home exercise routine, Type of exercise: Other - see comments;walking(golfing; gardening activities), Time (Minutes): 30, Frequency (Times/Week): 5, Weekly Exercise (Minutes/Week): 150, Intensity: Mild  Goals    . Weight (lb) < 140 lb (63.5 kg)     Lose weight by decreasing caloric/sugar intake and stay.        Fall Risk Fall Risk  03/17/2020 09/15/2019 07/28/2019 01/27/2019 08/01/2018  Falls in the past year? 0 0 0 0 No  Number falls in past yr: 0 0 0 0 -  Injury with Fall? 0 0 0 0 -  Follow up Falls evaluation completed;Education provided;Falls prevention discussed Falls evaluation completed - - -   Is the patient's home free of loose throw rugs in walkways, pet beds, electrical cords, etc?   yes      Grab bars in the bathroom? yes      Handrails on the stairs?   yes      Adequate lighting?   yes   Depression Screen PHQ 2/9 Scores 03/17/2020 09/15/2019 07/28/2019 01/27/2019  PHQ - 2 Score 0 0 0 0  PHQ- 9 Score - - 0 0  Exception Documentation - - - -     Cognitive Function- no cognitive concerns at this time  MMSE - Mini Mental State Exam 08/01/2018  Orientation to time 5  Orientation to Place 5  Registration 3  Attention/ Calculation 5  Recall 3  Language- name 2 objects 2  Language- repeat 1  Language- follow 3 step command 3  Language- read & follow direction 1  Write a sentence 1  Copy design 1  Total score 30     6CIT Screen 03/17/2020  What Year? 0 points  What month? 0 points  What time? 0 points  Count back from 20 0 points  Months in reverse 0 points  Repeat phrase 0 points  Total Score 0    Immunization History  Administered Date(s) Administered  . Influenza  Split 08/22/2011, 08/23/2012  . Influenza, High Dose Seasonal PF 08/01/2018  .  Influenza,inj,Quad PF,6+ Mos 09/02/2013, 09/03/2014, 09/09/2015, 08/31/2016, 08/01/2017, 07/28/2019  . PFIZER SARS-COV-2 Vaccination 12/18/2019, 01/12/2020  . Pneumococcal Conjugate-13 08/01/2017  . Pneumococcal Polysaccharide-23 08/01/2018  . Tdap 03/27/2012  . Zoster 09/23/2012  . Zoster Recombinat (Shingrix) 03/12/2020    Qualifies for Shingles Vaccine? Patient has completed 1st in Shingrix series and will have 2nd before 09/11/20  Screening Tests Health Maintenance  Topic Date Due  . Hepatitis C Screening  09/14/2020 (Originally 1951/06/08)  . INFLUENZA VACCINE  06/13/2020  . DEXA SCAN  10/15/2020  . COLONOSCOPY  01/24/2021  . MAMMOGRAM  11/16/2021  . TETANUS/TDAP  03/27/2022  . COVID-19 Vaccine  Completed  . PNA vac Low Risk Adult  Completed    Cancer Screenings: Lung: Low Dose CT Chest recommended if Age 48-80 years, 30 pack-year currently smoking OR have quit w/in 15years. Patient does not qualify. Breast:  Up to date on Mammogram? Yes   Up to date of Bone Density/Dexa? Yes Colorectal: colonoscopy 01/25/16     Plan:  I have personally reviewed and addressed the Medicare Annual Wellness questionnaire and have noted the following in the patient's chart:  A. Medical and social history B. Use of alcohol, tobacco or illicit drugs  C. Current medications and supplements D. Functional ability and status E.  Nutritional status F.  Physical activity G. Advance directives H. List of other physicians I.  Hospitalizations, surgeries, and ER visits in previous 12 months J.  [place] such as hearing and vision if needed, cognitive and depression L. Referrals, records requested, and appointments- none   In addition, I have reviewed and discussed with patient certain preventive protocols, quality metrics, and best practice recommendations. A written personalized care plan for preventive services as well as general preventive health recommendations were provided to  patient.   Signed,  Denman George, LPN  Nurse Health Advisor   Nurse Notes: no additional

## 2020-03-17 NOTE — Patient Instructions (Signed)
Kristen [name] , Thank you for taking time to come for your Medicare Wellness Visit. I appreciate your ongoing commitment to your health goals. Please review the following plan we discussed and let me know if I can assist you in the future.   Screening recommendations/referrals: Colorectal Screening: up to date; last colonoscopy 01/25/16 Mammogram: up to date; last 11/17/19 Bone Density: up to date; last 10/15/17 (repeat 10/2022)  Vision and Dental Exams: Recommended annual ophthalmology exams for early detection of glaucoma and other disorders of the eye Recommended annual dental exams for proper oral hygiene  Vaccinations: Influenza vaccine: completed 07/28/19 Pneumococcal vaccine: up to date; last 08/01/18 Tdap vaccine: up to date; last 03/27/12 Shingles vaccine: 1st in series completed 03/12/20; 2nd needed by 09/11/20  Covid vaccine: Completed   Advanced directives: We have received a copy of your POA (Power of [place]) and/or Living Will. These documents can be located in your chart.  Goals: Recommend to drink at least 6-8 8oz glasses of water per day and consume a balanced diet rich in fresh fruits and vegetables.   Next appointment: Please schedule your Annual Wellness Visit with your Nurse Health Advisor in one year.  Preventive Care 26 Years and Older, Female Preventive care refers to lifestyle choices and visits with your health care provider that can promote health and wellness. What does preventive care include?  A yearly physical exam. This is also called an annual well check.  Dental exams once or twice a year.  Routine eye exams. Ask your health care provider how often you should have your eyes checked.  Personal lifestyle choices, including:  Daily care of your teeth and gums.  Regular physical activity.  Eating a healthy diet.  Avoiding tobacco and drug use.  Limiting alcohol use.  Practicing safe sex.  Taking low-dose aspirin every day if recommended by  your health care provider.  Taking vitamin and mineral supplements as recommended by your health care provider. What happens during an annual well check? The services and screenings done by your health care provider during your annual well check will depend on your age, overall health, lifestyle risk factors, and family history of disease. Counseling  Your health care provider may ask you questions about your:  Alcohol use.  Tobacco use.  Drug use.  Emotional well-being.  Home and relationship well-being.  Sexual activity.  Eating habits.  History of falls.  Memory and ability to understand (cognition).  Work and work Statistician.  Reproductive health. Screening  You may have the following tests or measurements:  Height, weight, and BMI.  Blood pressure.  Lipid and cholesterol levels. These may be checked every 5 years, or more frequently if you are over 40 years old.  Skin check.  Lung cancer screening. You may have this screening every year starting at age 15 if you have a 30-pack-year history of smoking and currently smoke or have quit within the past 15 years.  Fecal occult blood test (FOBT) of the stool. You may have this test every year starting at age 66.  Flexible sigmoidoscopy or colonoscopy. You may have a sigmoidoscopy every 5 years or a colonoscopy every 10 years starting at age 8.  Hepatitis C blood test.  Hepatitis B blood test.  Sexually transmitted disease (STD) testing.  Diabetes screening. This is done by checking your blood sugar (glucose) after you have not eaten for a while (fasting). You may have this done every 1-3 years.  Bone density scan. This is done to screen  for osteoporosis. You may have this done starting at age 66.  Mammogram. This may be done every 1-2 years. Talk to your health care provider about how often you should have regular mammograms. Talk with your health care provider about your test results, treatment options, and  if necessary, the need for more tests. Vaccines  Your health care provider may recommend certain vaccines, such as:  Influenza vaccine. This is recommended every year.  Tetanus, diphtheria, and acellular pertussis (Tdap, Td) vaccine. You may need a Td booster every 10 years.  Zoster vaccine. You may need this after age 71.  Pneumococcal 13-valent conjugate (PCV13) vaccine. One dose is recommended after age 5.  Pneumococcal polysaccharide (PPSV23) vaccine. One dose is recommended after age 78. Talk to your health care provider about which screenings and vaccines you need and how often you need them. This information is not intended to replace advice given to you by your health care provider. Make sure you discuss any questions you have with your health care provider. Document Released: 11/26/2015 Document Revised: 07/19/2016 Document Reviewed: 08/31/2015 Elsevier Interactive Patient Education  2017 [place] Prevention in the Home Falls can cause injuries. They can happen to people of all ages. There are many things you can do to make your home safe and to help prevent falls. What can I do on the outside of my home?  Regularly fix the edges of walkways and driveways and fix any cracks.  Remove anything that might make you trip as you walk through a door, such as a raised step or threshold.  Trim any bushes or trees on the path to your home.  Use bright outdoor lighting.  Clear any walking paths of anything that might make someone trip, such as rocks or tools.  Regularly check to see if handrails are loose or broken. Make sure that both sides of any steps have handrails.  Any raised decks and porches should have guardrails on the edges.  Have any leaves, snow, or ice cleared regularly.  Use sand or salt on walking paths during winter.  Clean up any spills in your garage right away. This includes oil or grease spills. What can I do in the bathroom?  Use night  lights.  Install grab bars by the toilet and in the tub and shower. Do not use towel bars as grab bars.  Use non-skid mats or decals in the tub or shower.  If you need to sit down in the shower, use a plastic, non-slip stool.  Keep the floor dry. Clean up any water that spills on the floor as soon as it happens.  Remove soap buildup in the tub or shower regularly.  Attach bath mats securely with double-sided non-slip rug tape.  Do not have throw rugs and other things on the floor that can make you trip. What can I do in the bedroom?  Use night lights.  Make sure that you have a light by your bed that is easy to reach.  Do not use any sheets or blankets that are too big for your bed. They should not hang down onto the floor.  Have a firm chair that has side arms. You can use this for support while you get dressed.  Do not have throw rugs and other things on the floor that can make you trip. What can I do in the kitchen?  Clean up any spills right away.  Avoid walking on wet floors.  Keep items that you  use a lot in easy-to-reach places.  If you need to reach something above you, use a strong step stool that has a grab bar.  Keep electrical cords out of the way.  Do not use floor polish or wax that makes floors slippery. If you must use wax, use non-skid floor wax.  Do not have throw rugs and other things on the floor that can make you trip. What can I do with my stairs?  Do not leave any items on the stairs.  Make sure that there are handrails on both sides of the stairs and use them. Fix handrails that are broken or loose. Make sure that handrails are as long as the stairways.  Check any carpeting to make sure that it is firmly attached to the stairs. Fix any carpet that is loose or worn.  Avoid having throw rugs at the top or bottom of the stairs. If you do have throw rugs, attach them to the floor with carpet tape.  Make sure that you have a light switch at the  top of the stairs and the bottom of the stairs. If you do not have them, ask someone to add them for you. What else can I do to help prevent falls?  Wear shoes that:  Do not have high heels.  Have rubber bottoms.  Are comfortable and fit you well.  Are closed at the toe. Do not wear sandals.  If you use a stepladder:  Make sure that it is fully opened. Do not climb a closed stepladder.  Make sure that both sides of the stepladder are locked into place.  Ask someone to hold it for you, if possible.  Clearly mark and make sure that you can see:  Any grab bars or handrails.  First and last steps.  Where the edge of each step is.  Use tools that help you move around (mobility aids) if they are needed. These include:  Canes.  Walkers.  Scooters.  Crutches.  Turn on the lights when you go into a dark area. Replace any light bulbs as soon as they burn out.  Set up your furniture so you have a clear path. Avoid moving your furniture around.  If any of your floors are uneven, fix them.  If there are any pets around you, be aware of where they are.  Review your medicines with your doctor. Some medicines can make you feel dizzy. This can increase your chance of falling. Ask your doctor what other things that you can do to help prevent falls. This information is not intended to replace advice given to you by your health care provider. Make sure you discuss any questions you have with your health care provider. Document Released: 08/26/2009 Document Revised: 04/06/2016 Document Reviewed: 12/04/2014 Elsevier Interactive Patient Education  2017 [employer].

## 2020-03-18 DIAGNOSIS — Z6824 Body mass index (BMI) 24.0-24.9, adult: Secondary | ICD-10-CM | POA: Diagnosis not present

## 2020-03-18 DIAGNOSIS — M47816 Spondylosis without myelopathy or radiculopathy, lumbar region: Secondary | ICD-10-CM | POA: Diagnosis not present

## 2020-03-18 DIAGNOSIS — M4316 Spondylolisthesis, lumbar region: Secondary | ICD-10-CM | POA: Diagnosis not present

## 2020-04-19 ENCOUNTER — Ambulatory Visit (INDEPENDENT_AMBULATORY_CARE_PROVIDER_SITE_OTHER): Payer: Medicare Other | Admitting: Physician Assistant

## 2020-04-19 ENCOUNTER — Encounter: Payer: Self-pay | Admitting: Physician Assistant

## 2020-04-19 ENCOUNTER — Other Ambulatory Visit: Payer: Self-pay

## 2020-04-19 VITALS — BP 126/74 | HR 56 | Temp 98.0°F | Ht 65.0 in | Wt 148.4 lb

## 2020-04-19 DIAGNOSIS — H1031 Unspecified acute conjunctivitis, right eye: Secondary | ICD-10-CM

## 2020-04-19 MED ORDER — POLYMYXIN B-TRIMETHOPRIM 10000-0.1 UNIT/ML-% OP SOLN: OPHTHALMIC | 0 refills | Status: DC

## 2020-04-19 NOTE — Progress Notes (Signed)
Patient presents to clinic today c/o few days of itching, discomfort, redness and drainage from her right eye.  Denies any fever, chills, nausea or vomiting.  Denies URI symptoms.  Denies change in vision or overt eye pain.  Has noted purulent drainage and matting of her eyes, especially in the morning.  Denies symptoms of left eye.  Denies recent travel or sick contact.  Past Medical History:  Diagnosis Date  . Arthritis    In hands  . Hearing aid worn    Bil  . History of kidney stones   . History of shingles 2007   Shingles shot current  . Hypothyroid   . Kidney stones, calcium oxalate    Lithrotripsy x1; surgical removal X1  via cystoscopy  . Migraine    menopausal  . Pneumonia 4-5- yrs ago   "walking pneumonia"    Current Outpatient Medications on File Prior to Visit  Medication Sig Dispense Refill  . albuterol (PROVENTIL HFA;VENTOLIN HFA) 108 (90 Base) MCG/ACT inhaler Inhale 2 puffs into the lungs every 6 (six) hours as needed for wheezing or shortness of breath. 1 Inhaler 2  . AMBULATORY NON FORMULARY MEDICATION Medication Name: T3/T4 12.5 mcg-25 mcg capsule. Take 1 capsule by mouth every morning 90 capsule 1  . Ascorbic Acid (VITAMIN C PO) Take 1 tablet by mouth 2 (two) times daily.     . butalbital-aspirin-caffeine (FIORINAL) 50-325-40 MG capsule TAKE ONE CAPSULE BY MOUTH EVERY 6 HOURS AS NEEDED FOR HEADACHE 30 capsule 1  . Calcium Citrate-Vitamin D (CALCIUM CITRATE + PO) Take by mouth. Two times daily     . GLUCOSAMINE PO Take 1 tablet by mouth 2 (two) times daily.    . hydrOXYzine (ATARAX/VISTARIL) 10 MG tablet Take 1 tablet (10 mg total) by mouth 3 (three) times daily as needed. 30 tablet 0  . meloxicam (MOBIC) 15 MG tablet TAKE 1 TABLET BY MOUTH EVERY DAY 90 tablet 0  . Multiple Vitamin (MULTIVITAMIN) tablet Take 1 tablet by mouth daily. MinRX dietary supplement: 1 by mouth daily     . Omega-3 Fatty Acids (FISH OIL) 1200 MG CAPS Take by mouth 2 (two) times daily.        . permethrin (ELIMITE) 5 % cream     . Probiotic Product (PROBIOTIC DAILY PO) Take by mouth.    Marland Kitchen UNABLE TO FIND Med Name:indepamide 1.25 mg at hs    . ketorolac (TORADOL) 10 MG tablet Take 1 tablet (10 mg total) by mouth every 8 (eight) hours as needed for moderate pain. Or stent discomfort post-operatively 20 tablet 0  . oxyCODONE-acetaminophen (PERCOCET) 5-325 MG tablet Take 1 tablet by mouth every 4 (four) hours as needed for severe pain. Post-operatively 10 tablet 0   No current facility-administered medications on file prior to visit.    Allergies  Allergen Reactions  . Gantrisin [Sulfisoxazole]     Urticaria with sun exposure   . Penicillins     REACTION: as child; ? hives  . Red Yeast Rice [Cholestin] Hives  . Sulfa Antibiotics     Reaction not definite/ some hives    Family History  Problem Relation Age of Onset  . Dementia Mother   . COPD Mother   . Lung cancer Mother        smoker  . Cancer Father        Colon and Bladder cancer  . Diabetes Father   . Hypertension Father   . Colon cancer Father   . Cancer  Paternal Grandfather        prostate/ colon  . Colon cancer Paternal Grandfather   . Heart failure Paternal Grandmother   . Stroke Paternal Grandmother 87  . Melanoma Maternal Grandmother   . Diabetes Brother     Social History   Socioeconomic History  . Marital status: Married    Spouse name: Not on file  . Number of children: 2  . Years of education: Not on file  . Highest education level: Not on file  Occupational History  . 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 Smoking status: Never Smoker  . Smokeless tobacco: Never Used  Substance and Sexual Activity  . Alcohol use: Not Currently    Comment: quit 11-07-2019  . Drug use: No  . Sexual activity: Not Currently    Birth control/protection: Post-menopausal    Comment: 1st intercourse 80 yo-1 partner  Other Topics Concern  .  Not on file  Social History Narrative   4 Grandchildren    Hobbies: Gardening and Reading; Pension scheme manager    Social Determinants of Health   Financial Resource Strain:   . Difficulty of Paying Living Expenses:   Food Insecurity:   . Worried About Charity fundraiser in the Last Year:   . Arboriculturist in the Last Year:   Transportation Needs:   . Film/video editor (Medical):   Marland Kitchen Lack of Transportation (Non-Medical):   Physical Activity:   . Days of Exercise per Week:   . Minutes of Exercise per Session:   Stress:   . Feeling of Stress :   Social Connections:   . Frequency of Communication with Friends and Family:   . Frequency of Social Gatherings with Friends and Family:   . Attends Religious Services:   . Active Member of Clubs or Organizations:   . Attends Archivist Meetings:   Marland Kitchen Marital Status:    Review of Systems - See HPI.  All other ROS are negative.  BP 126/74   Pulse (!) 56   Temp 98 F (36.7 C)   Ht 5\' 5"  (1.651 m)   Wt 148 lb 6.4 oz (67.3 kg)   SpO2 98%   BMI 24.70 kg/m   Physical Exam Vitals reviewed.  Constitutional:      Appearance: Normal appearance.  HENT:     Head: Normocephalic and atraumatic.  Eyes:     General: Lids are normal. Gaze aligned appropriately. No scleral icterus.       Right eye: No foreign body or hordeolum.        Left eye: No foreign body, discharge or hordeolum.     Extraocular Movements: Extraocular movements intact.     Conjunctiva/sclera:     Right eye: Right conjunctiva is injected. Exudate present. No hemorrhage.    Left eye: Left conjunctiva is not injected. No chemosis, exudate or hemorrhage.    Pupils: Pupils are equal, round, and reactive to light.  Cardiovascular:     Rate and Rhythm: Normal rate and regular rhythm.     Pulses: Normal pulses.     Heart sounds: Normal heart sounds.  Pulmonary:     Effort: Pulmonary effort is normal.  Musculoskeletal:     Cervical back: Neck supple.  Neurological:      Mental Status: She is alert.     Assessment/Plan: 1. Acute bacterial conjunctivitis of right eye Rx Polytrim OP. Continue warm compresses.  Avoid touching eyes. Hygiene reviewed. Strict return precautions reviewed with patient.    This visit occurred during the SARS-CoV-2 public health emergency.  Safety protocols were in place, including screening questions prior to the visit, additional usage of staff PPE, and extensive cleaning of exam room while observing appropriate contact time as indicated for disinfecting solutions.     Leeanne Rio, PA-C

## 2020-04-19 NOTE — Addendum Note (Signed)
Addended by: Brunetta Jeans on: 04/19/2020 12:40 PM   Modules accepted: Orders

## 2020-04-19 NOTE — Patient Instructions (Signed)
Please use antibiotic drops as directed. Start warm compresses to the eye for 10-15 minutes, a few times per day. Keep hands washed. Avoid touching the eyes. Let us know if symptoms are not resolving, new symptoms develop or anything worsens.   Take care!   Bacterial Conjunctivitis, Adult Bacterial conjunctivitis is an infection of your conjunctiva. This is the clear membrane that covers the white part of your eye and the inner part of your eyelid. This infection can make your eye:  Red or pink.  Itchy. This condition spreads easily from person to person (is contagious) and from one eye to the other eye. What are the causes?  This condition is caused by germs (bacteria). You may get the infection if you come into close contact with: ? A person who has the infection. ? Items that have germs on them (are contaminated), such as face towels, contact lens solution, or eye makeup. What increases the risk? You are more likely to get this condition if you:  Have contact with people who have the infection.  Wear contact lenses.  Have a sinus infection.  Have had a recent eye injury or surgery.  Have a weak body defense system (immune system).  Have dry eyes. What are the signs or symptoms?   Thick, yellowish discharge from the eye.  Tearing or watery eyes.  Itchy eyes.  Burning feeling in your eyes.  Eye redness.  Swollen eyelids.  Blurred vision. How is this treated?   Antibiotic eye drops or ointment.  Antibiotic medicine taken by mouth. This is used for infections that do not get better with drops or ointment or that last more than 10 days.  Cool, wet cloths placed on the eyes.  Artificial tears used 2-6 times a day. Follow these instructions at home: Medicines  Take or apply your antibiotic medicine as told by your doctor. Do not stop taking or applying the antibiotic even if you start to feel better.  Take or apply over-the-counter and prescription  medicines only as told by your doctor.  Do not touch your eyelid with the eye-drop bottle or the ointment tube. Managing discomfort  Wipe any fluid from your eye with a warm, wet washcloth or a cotton ball.  Place a clean, cool, wet cloth on your eye. Do this for 10-20 minutes, 3-4 times per day. General instructions  Do not wear contacts until the infection is gone. Wear glasses until your doctor says it is okay to wear contacts again.  Do not wear eye makeup until the infection is gone. Throw away old eye makeup.  Change or wash your pillowcase every day.  Do not share towels or washcloths.  Wash your hands often with soap and water. Use paper towels to dry your hands.  Do not touch or rub your eyes.  Do not drive or use heavy machinery if your vision is blurred. Contact a doctor if:  You have a fever.  You do not get better after 10 days. Get help right away if:  You have a fever and your symptoms get worse all of a sudden.  You have very bad pain when you move your eye.  Your face: ? Hurts. ? Is red. ? Is swollen.  You have sudden loss of vision. Summary  Bacterial conjunctivitis is an infection of your conjunctiva.  This infection spreads easily from person to person.  Wash your hands often with soap and water. Use paper towels to dry your hands.  Take or apply  your antibiotic medicine as told by your doctor.  Contact a doctor if you have a fever or you do not get better after 10 days. This information is not intended to replace advice given to you by your health care provider. Make sure you discuss any questions you have with your health care provider. Document Revised: 02/18/2019 Document Reviewed: 06/05/2018 Elsevier Patient Education  [place].

## 2020-04-20 ENCOUNTER — Other Ambulatory Visit: Payer: Self-pay | Admitting: General Practice

## 2020-04-20 MED ORDER — AMBULATORY NON FORMULARY MEDICATION: 1 refills | Status: DC

## 2020-05-10 ENCOUNTER — Ambulatory Visit: Payer: Medicare Other

## 2020-05-10 ENCOUNTER — Telehealth: Payer: Self-pay

## 2020-05-10 NOTE — Progress Notes (Signed)
error 

## 2020-05-10 NOTE — Telephone Encounter (Signed)
  Chronic Care Management   Outreach Note   Name: Kristen [name] MRN: [id number] DOB: [birth year]  Referred by: Midge Minium, MD Reason for referral: Telephone Appointment with [facility] Pharmacist, Madelin Rear.   An unsuccessful telephone outreach was attempted today. The patient was referred to the pharmacist for assistance with care management and care coordination.    Telephone appointment with clinical pharmacist today (05/10/2020) at 830 am. If patient immediately returns call, please transfer to ext [phone]. Otherwise, please provide number below to reschedule visit.   Madelin Rear, Pharm.D., BCGP Clinical Pharmacist [facility] [phone]

## 2020-05-22 ENCOUNTER — Other Ambulatory Visit: Payer: Self-pay | Admitting: Family Medicine

## 2020-05-31 DIAGNOSIS — M47816 Spondylosis without myelopathy or radiculopathy, lumbar region: Secondary | ICD-10-CM | POA: Diagnosis not present

## 2020-05-31 DIAGNOSIS — M4316 Spondylolisthesis, lumbar region: Secondary | ICD-10-CM | POA: Diagnosis not present

## 2020-05-31 DIAGNOSIS — Z6824 Body mass index (BMI) 24.0-24.9, adult: Secondary | ICD-10-CM | POA: Diagnosis not present

## 2020-06-22 DIAGNOSIS — M25562 Pain in left knee: Secondary | ICD-10-CM | POA: Diagnosis not present

## 2020-06-29 DIAGNOSIS — M25561 Pain in right knee: Secondary | ICD-10-CM | POA: Diagnosis not present

## 2020-06-29 DIAGNOSIS — M1711 Unilateral primary osteoarthritis, right knee: Secondary | ICD-10-CM | POA: Diagnosis not present

## 2020-07-06 DIAGNOSIS — M1711 Unilateral primary osteoarthritis, right knee: Secondary | ICD-10-CM | POA: Diagnosis not present

## 2020-07-06 DIAGNOSIS — M25561 Pain in right knee: Secondary | ICD-10-CM | POA: Diagnosis not present

## 2020-07-13 DIAGNOSIS — M1711 Unilateral primary osteoarthritis, right knee: Secondary | ICD-10-CM | POA: Diagnosis not present

## 2020-07-13 DIAGNOSIS — M25561 Pain in right knee: Secondary | ICD-10-CM | POA: Diagnosis not present

## 2020-07-26 DIAGNOSIS — M47816 Spondylosis without myelopathy or radiculopathy, lumbar region: Secondary | ICD-10-CM | POA: Diagnosis not present

## 2020-08-02 ENCOUNTER — Encounter: Payer: Medicare Other | Admitting: Family Medicine

## 2020-08-10 ENCOUNTER — Encounter: Payer: Self-pay | Admitting: Family Medicine

## 2020-08-10 ENCOUNTER — Ambulatory Visit (INDEPENDENT_AMBULATORY_CARE_PROVIDER_SITE_OTHER): Payer: Medicare Other | Admitting: Family Medicine

## 2020-08-10 ENCOUNTER — Other Ambulatory Visit: Payer: Self-pay

## 2020-08-10 VITALS — BP 124/74 | HR 76 | Temp 97.9°F | Resp 16 | Ht 65.0 in | Wt 150.0 lb

## 2020-08-10 DIAGNOSIS — E038 Other specified hypothyroidism: Secondary | ICD-10-CM | POA: Diagnosis not present

## 2020-08-10 DIAGNOSIS — E785 Hyperlipidemia, unspecified: Secondary | ICD-10-CM

## 2020-08-10 DIAGNOSIS — N200 Calculus of kidney: Secondary | ICD-10-CM

## 2020-08-10 LAB — CBC WITH DIFFERENTIAL/PLATELET
Basophils Absolute: 0.1 10*3/uL (ref 0.0–0.1)
Basophils Relative: 1.1 % (ref 0.0–3.0)
Eosinophils Absolute: 0.1 10*3/uL (ref 0.0–0.7)
Eosinophils Relative: 1.1 % (ref 0.0–5.0)
HCT: 41.8 % (ref 36.0–46.0)
Hemoglobin: 14.6 g/dL (ref 12.0–15.0)
Lymphocytes Relative: 19.5 % (ref 12.0–46.0)
Lymphs Abs: 1.3 10*3/uL (ref 0.7–4.0)
MCHC: 34.9 g/dL (ref 30.0–36.0)
MCV: 95.3 fl (ref 78.0–100.0)
Monocytes Absolute: 0.6 10*3/uL (ref 0.1–1.0)
Monocytes Relative: 9.4 % (ref 3.0–12.0)
Neutro Abs: 4.6 10*3/uL (ref 1.4–7.7)
Neutrophils Relative %: 68.9 % (ref 43.0–77.0)
Platelets: 177 10*3/uL (ref 150.0–400.0)
RBC: 4.39 Mil/uL (ref 3.87–5.11)
RDW: 12.7 % (ref 11.5–15.5)
WBC: 6.7 10*3/uL (ref 4.0–10.5)

## 2020-08-10 LAB — TSH: TSH: 1.4 u[IU]/mL (ref 0.35–4.50)

## 2020-08-10 LAB — BASIC METABOLIC PANEL
BUN: 22 mg/dL (ref 6–23)
CO2: 29 mEq/L (ref 19–32)
Calcium: 9.4 mg/dL (ref 8.4–10.5)
Chloride: 101 mEq/L (ref 96–112)
Creatinine, Ser: 0.69 mg/dL (ref 0.40–1.20)
GFR: 84.5 mL/min (ref >60.00)
Glucose, Bld: 83 mg/dL (ref 70–99)
Potassium: 3.6 mEq/L (ref 3.5–5.1)
Sodium: 139 mEq/L (ref 135–145)

## 2020-08-10 LAB — LIPID PANEL
Cholesterol: 246 mg/dL — ABNORMAL HIGH (ref 0–200)
HDL: 67.2 mg/dL (ref >39.00)
NonHDL: 178.41
Total CHOL/HDL Ratio: 4
Triglycerides: 201 mg/dL — ABNORMAL HIGH (ref 0.0–149.0)
VLDL: 40.2 mg/dL — ABNORMAL HIGH (ref 0.0–40.0)

## 2020-08-10 LAB — HEPATIC FUNCTION PANEL
ALT: 19 U/L (ref 0–35)
AST: 19 U/L (ref 0–37)
Albumin: 4.4 g/dL (ref 3.5–5.2)
Alkaline Phosphatase: 61 U/L (ref 39–117)
Bilirubin, Direct: 0.2 mg/dL (ref 0.0–0.3)
Total Bilirubin: 1.8 mg/dL — ABNORMAL HIGH (ref 0.2–1.2)
Total Protein: 6.6 g/dL (ref 6.0–8.3)

## 2020-08-10 LAB — LDL CHOLESTEROL, DIRECT: Direct LDL: 146 mg/dL

## 2020-08-10 NOTE — Assessment & Plan Note (Signed)
Chronic problem.  Attempting to control w/ diet and exercise as she did not tolerate Red Yeast Rice previously and she does not want to take medication.  Check labs and determine the next steps.

## 2020-08-10 NOTE — Assessment & Plan Note (Signed)
Chronic problem.  On compounded T3/T4 12.57mcg/25mcg daily and TSH levels have been stable for some time.  Currently asymptomatic.  Check labs.  Adjust meds prn

## 2020-08-10 NOTE — Assessment & Plan Note (Signed)
Chronic problem.  Following w/ Dr Tresa Moore.  Now on daily Indapamide to prevent stones.  Given that it's a diuretic will check BMP.

## 2020-08-10 NOTE — Patient Instructions (Addendum)
Follow up in 6 months to recheck cholesterol We'll notify you of your lab results and make any changes if needed Keep up the good work on healthy diet and regular exercise- you look great! Call with any questions or concerns Stay Safe!  Stay Healthy!!

## 2020-08-10 NOTE — Addendum Note (Signed)
Addended by: Midge Minium on: 08/10/2020 09:49 AM   Modules accepted: Orders

## 2020-08-10 NOTE — Progress Notes (Signed)
   Subjective:    Patient ID: Kristen [name], female    DOB: Feb 01, 1951, 69 y.o.   MRN: [id number]  HPI Hyperlipidemia- chronic problem.  Last LDL 147.  Attempting to control w/ diet and exercise.  Had hives w/ Red Yeast Rice.  Would like to avoid medication.  Exercising regularly- golfing, walking, gardening.  No CP, SOB, abd pain, N/V.  Hypothyroid- chronic problem, on compounded T3/T4 12.51mcg/25mcg daily.  Reports energy level is good.  Denies changes to skin/hair/nails.  Recurrent kidney stones- following w/ Dr Tresa Moore.  On Indapamide daily for stone prevention.  Health Maintenance- UTD on pap, mammo, colonoscopy, pneumonia vaccines, COVID vaccines.  Plans on COVID booster and flu shot   Review of Systems For ROS see HPI   This visit occurred during the SARS-CoV-2 public health emergency.  Safety protocols were in place, including screening questions prior to the visit, additional usage of staff PPE, and extensive cleaning of exam room while observing appropriate contact time as indicated for disinfecting solutions.       Objective:   Physical Exam Vitals reviewed.  Constitutional:      General: She is not in acute distress.    Appearance: Normal appearance. She is well-developed.  HENT:     Head: Normocephalic and atraumatic.  Eyes:     Conjunctiva/sclera: Conjunctivae normal.     Pupils: Pupils are equal, round, and reactive to light.  Neck:     Thyroid: No thyromegaly.  Cardiovascular:     Rate and Rhythm: Normal rate and regular rhythm.     Heart sounds: Normal heart sounds. No murmur heard.   Pulmonary:     Effort: Pulmonary effort is normal. No respiratory distress.     Breath sounds: Normal breath sounds.  Abdominal:     General: There is no distension.     Palpations: Abdomen is soft.     Tenderness: There is no abdominal tenderness.  Musculoskeletal:     Cervical back: Normal range of motion and neck supple.  Lymphadenopathy:     Cervical: No cervical  adenopathy.  Skin:    General: Skin is warm and dry.  Neurological:     Mental Status: She is alert and oriented to person, place, and time.  Psychiatric:        Behavior: Behavior normal.           Assessment & Plan:

## 2020-08-11 ENCOUNTER — Encounter: Payer: Self-pay | Admitting: General Practice

## 2020-08-15 DIAGNOSIS — Z23 Encounter for immunization: Secondary | ICD-10-CM | POA: Diagnosis not present

## 2020-08-30 DIAGNOSIS — M47816 Spondylosis without myelopathy or radiculopathy, lumbar region: Secondary | ICD-10-CM | POA: Diagnosis not present

## 2020-09-07 ENCOUNTER — Ambulatory Visit (INDEPENDENT_AMBULATORY_CARE_PROVIDER_SITE_OTHER): Payer: Medicare Other

## 2020-09-07 ENCOUNTER — Other Ambulatory Visit: Payer: Self-pay

## 2020-09-07 DIAGNOSIS — Z23 Encounter for immunization: Secondary | ICD-10-CM

## 2020-09-10 ENCOUNTER — Other Ambulatory Visit: Payer: Self-pay | Admitting: Family Medicine

## 2020-09-23 ENCOUNTER — Encounter: Payer: Self-pay | Admitting: Obstetrics and Gynecology

## 2020-09-23 ENCOUNTER — Other Ambulatory Visit: Payer: Self-pay

## 2020-09-23 ENCOUNTER — Ambulatory Visit (INDEPENDENT_AMBULATORY_CARE_PROVIDER_SITE_OTHER): Payer: Medicare Other | Admitting: Obstetrics and Gynecology

## 2020-09-23 VITALS — BP 126/82 | Ht 63.5 in | Wt 153.0 lb

## 2020-09-23 DIAGNOSIS — Z01419 Encounter for gynecological examination (general) (routine) without abnormal findings: Secondary | ICD-10-CM

## 2020-09-23 NOTE — Progress Notes (Signed)
BURNICE VASSEL [birth year] [id number]  SUBJECTIVE:  69 y.o. G2P2001 female here for a breast and pelvic exam. She has no gynecologic concerns.  Current Outpatient Medications  Medication Sig Dispense Refill  . albuterol (PROVENTIL HFA;VENTOLIN HFA) 108 (90 Base) MCG/ACT inhaler Inhale 2 puffs into the lungs every 6 (six) hours as needed for wheezing or shortness of breath. 1 Inhaler 2  . AMBULATORY NON FORMULARY MEDICATION Medication Name: T3/T4 12.5 mcg-25 mcg capsule. Take 1 capsule by mouth every morning 90 capsule 1  . Ascorbic Acid (VITAMIN C PO) Take 1 tablet by mouth 2 (two) times daily.     . butalbital-aspirin-caffeine (FIORINAL) 50-325-40 MG capsule TAKE ONE CAPSULE BY MOUTH EVERY 6 HOURS AS NEEDED FOR HEADACHE 30 capsule 1  . Calcium Citrate-Vitamin D (CALCIUM CITRATE + PO) Take by mouth. Two times daily     . GLUCOSAMINE PO Take 1 tablet by mouth 2 (two) times daily.    . indapamide (LOZOL) 1.25 MG tablet Take 1.25 mg by mouth daily.    Marland Kitchen loratadine (CLARITIN) 10 MG tablet Take 10 mg by mouth daily.    . meloxicam (MOBIC) 15 MG tablet TAKE 1 TABLET BY MOUTH EVERY DAY 90 tablet 0  . Multiple Vitamin (MULTIVITAMIN) tablet Take 1 tablet by mouth daily. MinRX dietary supplement: 1 by mouth daily     . Omega-3 Fatty Acids (FISH OIL) 1200 MG CAPS Take by mouth 2 (two) times daily.      . Probiotic Product (PROBIOTIC DAILY PO) Take by mouth.    Marland Kitchen UNABLE TO FIND Med Name:indepamide 1.25 mg at hs     No current facility-administered medications for this visit.   Allergies: Gantrisin [sulfisoxazole], Penicillins, Red yeast rice [cholestin], and Sulfa antibiotics  No LMP recorded. Patient is postmenopausal.  Past medical history,surgical history, problem list, medications, allergies, family history and social history were all reviewed and documented as reviewed in the EPIC chart.  GYN ROS: no abnormal bleeding, pelvic pain or discharge, no breast pain or new or enlarging lumps on  self exam.  No dysuria, urinary frequency, pain with urination, cloudy/malodorous urine.   OBJECTIVE:  BP 126/82   Ht 5' 3.5" (1.613 m)   Wt 153 lb (69.4 kg)   BMI 26.68 kg/m  The patient appears well, alert, oriented, in no distress.  BREAST EXAM: breasts appear normal, no suspicious masses, no skin or nipple changes or axillary nodes  PELVIC EXAM: VULVA: normal appearing vulva with atrophic change, no masses, tenderness or lesions, VAGINA: normal appearing vagina with atrophic change, normal color and discharge, no lesions, CERVIX: normal appearing atrophic cervix without discharge or lesions, UTERUS: uterus is normal size, shape, consistency and nontender, ADNEXA: normal adnexa in size, nontender and no masses  Chaperone: [name] present during the examination  ASSESSMENT:  69 y.o. G2P2001 here for a breast and pelvic exam  PLAN:   1. Postmenopausal. 2. Pap smear 2020.  No history of abnormal Pap smears.  Will review the Pap smear screening guidelines with her and see if she desires to continue screening at the next 3-year interval in 2023. 3. Mammogram 11/2019.  Normal breast exam today.  She is reminded to schedule an annual mammogram when due. 4. Colonoscopy 2017.  She will follow up at the interval recommended by her GI specialist.  5. DEXA 2018 normal.  Next DEXA recommended 2023. 6. Health maintenance.  No labs today as she normally has these completed elsewhere.  Return annually or sooner, prn.  Joseph Pierini MD 09/23/20

## 2020-11-01 DIAGNOSIS — H01021 Squamous blepharitis right upper eyelid: Secondary | ICD-10-CM | POA: Diagnosis not present

## 2020-11-03 ENCOUNTER — Other Ambulatory Visit: Payer: Self-pay

## 2020-11-03 ENCOUNTER — Telehealth: Payer: Self-pay | Admitting: Family Medicine

## 2020-11-03 DIAGNOSIS — Z0110 Encounter for examination of ears and hearing without abnormal findings: Secondary | ICD-10-CM

## 2020-11-03 NOTE — Telephone Encounter (Signed)
Okay to place referral

## 2020-11-03 NOTE — Telephone Encounter (Signed)
Patient is needing a hearing test referral so she can get her new hering aids.  Can it be placed to [facility], Dr. Arlie Solomons

## 2020-11-03 NOTE — Telephone Encounter (Signed)
Referral placed.

## 2020-11-15 ENCOUNTER — Telehealth: Payer: Self-pay | Admitting: Family Medicine

## 2020-11-15 NOTE — Telephone Encounter (Signed)
Patient called concerning her referral  - states that referral should be made to [facility] - Their fax # is [phone]. Referral must include:  Patients Name, Date of Birth, Referral issue date, Dr. Rennis Golden signature and also reason for referral - Needs evaluation of hearing to get new hearing aids.

## 2020-11-16 ENCOUNTER — Other Ambulatory Visit: Payer: Self-pay

## 2020-11-16 NOTE — Telephone Encounter (Signed)
Pt is aware that the referral was faxed on 12/22 and then again on 11/16/20 [facility]

## 2020-11-16 NOTE — Addendum Note (Signed)
Addended by: Lana Fish on: 11/16/2020 03:43 PM   Modules accepted: Orders

## 2020-11-18 DIAGNOSIS — H6123 Impacted cerumen, bilateral: Secondary | ICD-10-CM | POA: Insufficient documentation

## 2020-11-18 DIAGNOSIS — H9113 Presbycusis, bilateral: Secondary | ICD-10-CM | POA: Insufficient documentation

## 2020-11-19 DIAGNOSIS — L245 Irritant contact dermatitis due to other chemical products: Secondary | ICD-10-CM | POA: Diagnosis not present

## 2020-11-19 DIAGNOSIS — L309 Dermatitis, unspecified: Secondary | ICD-10-CM | POA: Diagnosis not present

## 2020-11-22 ENCOUNTER — Encounter: Payer: Self-pay | Admitting: Emergency Medicine

## 2020-11-22 DIAGNOSIS — H903 Sensorineural hearing loss, bilateral: Secondary | ICD-10-CM | POA: Diagnosis not present

## 2020-11-22 DIAGNOSIS — Z1231 Encounter for screening mammogram for malignant neoplasm of breast: Secondary | ICD-10-CM | POA: Diagnosis not present

## 2020-11-22 LAB — HM MAMMOGRAPHY

## 2020-12-14 ENCOUNTER — Other Ambulatory Visit: Payer: Self-pay | Admitting: Family Medicine

## 2021-01-25 DIAGNOSIS — M1711 Unilateral primary osteoarthritis, right knee: Secondary | ICD-10-CM | POA: Diagnosis not present

## 2021-01-25 DIAGNOSIS — M25561 Pain in right knee: Secondary | ICD-10-CM | POA: Diagnosis not present

## 2021-01-25 DIAGNOSIS — M1710 Unilateral primary osteoarthritis, unspecified knee: Secondary | ICD-10-CM | POA: Insufficient documentation

## 2021-01-25 DIAGNOSIS — M179 Osteoarthritis of knee, unspecified: Secondary | ICD-10-CM | POA: Insufficient documentation

## 2021-01-25 DIAGNOSIS — M1712 Unilateral primary osteoarthritis, left knee: Secondary | ICD-10-CM | POA: Diagnosis not present

## 2021-01-25 DIAGNOSIS — M170 Bilateral primary osteoarthritis of knee: Secondary | ICD-10-CM | POA: Diagnosis not present

## 2021-02-01 DIAGNOSIS — M1711 Unilateral primary osteoarthritis, right knee: Secondary | ICD-10-CM | POA: Diagnosis not present

## 2021-02-08 DIAGNOSIS — N200 Calculus of kidney: Secondary | ICD-10-CM | POA: Diagnosis not present

## 2021-02-08 DIAGNOSIS — N281 Cyst of kidney, acquired: Secondary | ICD-10-CM | POA: Diagnosis not present

## 2021-02-14 ENCOUNTER — Ambulatory Visit (INDEPENDENT_AMBULATORY_CARE_PROVIDER_SITE_OTHER): Payer: Medicare Other | Admitting: Family Medicine

## 2021-02-14 ENCOUNTER — Encounter: Payer: Self-pay | Admitting: Family Medicine

## 2021-02-14 ENCOUNTER — Other Ambulatory Visit: Payer: Self-pay

## 2021-02-14 VITALS — BP 118/70 | HR 66 | Temp 97.8°F | Resp 19 | Ht 63.5 in | Wt 153.6 lb

## 2021-02-14 DIAGNOSIS — E663 Overweight: Secondary | ICD-10-CM

## 2021-02-14 DIAGNOSIS — E038 Other specified hypothyroidism: Secondary | ICD-10-CM | POA: Diagnosis not present

## 2021-02-14 DIAGNOSIS — E785 Hyperlipidemia, unspecified: Secondary | ICD-10-CM | POA: Diagnosis not present

## 2021-02-14 LAB — BASIC METABOLIC PANEL
BUN: 18 mg/dL (ref 6–23)
CO2: 29 mEq/L (ref 19–32)
Calcium: 9.8 mg/dL (ref 8.4–10.5)
Chloride: 103 mEq/L (ref 96–112)
Creatinine, Ser: 0.77 mg/dL (ref 0.40–1.20)
GFR: 78.97 mL/min (ref >60.00)
Glucose, Bld: 84 mg/dL (ref 70–99)
Potassium: 3.8 mEq/L (ref 3.5–5.1)
Sodium: 139 mEq/L (ref 135–145)

## 2021-02-14 LAB — HEPATIC FUNCTION PANEL
ALT: 18 U/L (ref 0–35)
AST: 20 U/L (ref 0–37)
Albumin: 4.5 g/dL (ref 3.5–5.2)
Alkaline Phosphatase: 55 U/L (ref 39–117)
Bilirubin, Direct: 0.2 mg/dL (ref 0.0–0.3)
Total Bilirubin: 1.2 mg/dL (ref 0.2–1.2)
Total Protein: 6.7 g/dL (ref 6.0–8.3)

## 2021-02-14 LAB — TSH: TSH: 2 u[IU]/mL (ref 0.35–4.50)

## 2021-02-14 LAB — CBC WITH DIFFERENTIAL/PLATELET
Basophils Absolute: 0 10*3/uL (ref 0.0–0.1)
Basophils Relative: 0.8 % (ref 0.0–3.0)
Eosinophils Absolute: 0.1 10*3/uL (ref 0.0–0.7)
Eosinophils Relative: 2 % (ref 0.0–5.0)
HCT: 43.6 % (ref 36.0–46.0)
Hemoglobin: 15.3 g/dL — ABNORMAL HIGH (ref 12.0–15.0)
Lymphocytes Relative: 27.6 % (ref 12.0–46.0)
Lymphs Abs: 1.7 10*3/uL (ref 0.7–4.0)
MCHC: 35.2 g/dL (ref 30.0–36.0)
MCV: 92.8 fl (ref 78.0–100.0)
Monocytes Absolute: 0.5 10*3/uL (ref 0.1–1.0)
Monocytes Relative: 8.8 % (ref 3.0–12.0)
Neutro Abs: 3.7 10*3/uL (ref 1.4–7.7)
Neutrophils Relative %: 60.8 % (ref 43.0–77.0)
Platelets: 172 10*3/uL (ref 150.0–400.0)
RBC: 4.69 Mil/uL (ref 3.87–5.11)
RDW: 12.5 % (ref 11.5–15.5)
WBC: 6 10*3/uL (ref 4.0–10.5)

## 2021-02-14 LAB — LIPID PANEL
Cholesterol: 239 mg/dL — ABNORMAL HIGH (ref 0–200)
HDL: 59.1 mg/dL (ref >39.00)
NonHDL: 180.09
Total CHOL/HDL Ratio: 4
Triglycerides: 219 mg/dL — ABNORMAL HIGH (ref 0.0–149.0)
VLDL: 43.8 mg/dL — ABNORMAL HIGH (ref 0.0–40.0)

## 2021-02-14 LAB — LDL CHOLESTEROL, DIRECT: Direct LDL: 158 mg/dL

## 2021-02-14 NOTE — Assessment & Plan Note (Signed)
Chronic problem.  Pt has been asymptomatic on compounded T3/T4.  Check labs.  Adjust meds prn

## 2021-02-14 NOTE — Assessment & Plan Note (Signed)
Pt is very frustrated w/ her weight.  Is working extensively on diet but is limited in exercise due to ongoing knee pain.  Encouraged her to give herself some grace.  Will follow.

## 2021-02-14 NOTE — Progress Notes (Signed)
   Subjective:    Patient ID: Kristen [name], female    DOB: September 30, 1951, 70 y.o.   MRN: [id number]  HPI Hyperlipidemia- chronic problem, on fish oil daily but otherwise attempting to control w/ diet and exercise.  Has not been able to exercise due to ongoing knee issues.  Has been working hard on diet.  Denies CP, SOB, abd pain, N/V.  Hypothyroid- chronic problem.  On compounded T3/T4 12.63mcg/25mcg daily.  Energy level is good.  Denies changes to skin/hair/nails  Overweight- pt has gained 5 lbs since last visit.  Pt continues to have chronic R knee pain- is facing upcoming knee replacement.  Is frustrated w/ her lack of exercise   Review of Systems For ROS see HPI   This visit occurred during the SARS-CoV-2 public health emergency.  Safety protocols were in place, including screening questions prior to the visit, additional usage of staff PPE, and extensive cleaning of exam room while observing appropriate contact time as indicated for disinfecting solutions.       Objective:   Physical Exam Constitutional:      General: She is not in acute distress.    Appearance: Normal appearance. She is well-developed.  HENT:     Head: Normocephalic and atraumatic.  Eyes:     Conjunctiva/sclera: Conjunctivae normal.     Pupils: Pupils are equal, round, and reactive to light.  Neck:     Thyroid: No thyromegaly.  Cardiovascular:     Rate and Rhythm: Normal rate and regular rhythm.     Pulses: Normal pulses.     Heart sounds: Normal heart sounds. No murmur heard.   Pulmonary:     Effort: Pulmonary effort is normal. No respiratory distress.     Breath sounds: Normal breath sounds.  Abdominal:     General: There is no distension.     Palpations: Abdomen is soft.     Tenderness: There is no abdominal tenderness.  Musculoskeletal:     Cervical back: Normal range of motion and neck supple.     Right lower leg: No edema.     Left lower leg: No edema.  Lymphadenopathy:     Cervical: No  cervical adenopathy.  Skin:    General: Skin is warm and dry.  Neurological:     General: No focal deficit present.     Mental Status: She is alert and oriented to person, place, and time.  Psychiatric:        Behavior: Behavior normal.           Assessment & Plan:

## 2021-02-14 NOTE — Patient Instructions (Signed)
Follow up in 6 months to recheck cholesterol and thyroid We'll notify you of your lab results and make any changes if needed Return to exercise as you are able Keep up the good work on healthy diet- you're doing great! Call with any questions or concerns Stay Safe!  Stay Healthy! Happy Early Rudene Anda!!!

## 2021-02-14 NOTE — Assessment & Plan Note (Signed)
Ongoing issue for pt.  Pt has been unable to exercise recently and is very concerned about her lipid panel.  Check labs and adjust tx plan prn.

## 2021-02-15 ENCOUNTER — Encounter: Payer: Self-pay | Admitting: Family Medicine

## 2021-03-02 ENCOUNTER — Telehealth: Payer: Self-pay | Admitting: Family Medicine

## 2021-03-02 NOTE — Telephone Encounter (Signed)
Spoke with patient and she declined AWV stating it was redundant. Check with her next year

## 2021-03-10 DIAGNOSIS — H1045 Other chronic allergic conjunctivitis: Secondary | ICD-10-CM | POA: Diagnosis not present

## 2021-03-10 DIAGNOSIS — H52221 Regular astigmatism, right eye: Secondary | ICD-10-CM | POA: Diagnosis not present

## 2021-03-10 DIAGNOSIS — H524 Presbyopia: Secondary | ICD-10-CM | POA: Diagnosis not present

## 2021-03-10 DIAGNOSIS — H5203 Hypermetropia, bilateral: Secondary | ICD-10-CM | POA: Diagnosis not present

## 2021-03-10 DIAGNOSIS — H2513 Age-related nuclear cataract, bilateral: Secondary | ICD-10-CM | POA: Diagnosis not present

## 2021-03-12 ENCOUNTER — Other Ambulatory Visit: Payer: Self-pay | Admitting: Family Medicine

## 2021-03-30 ENCOUNTER — Other Ambulatory Visit: Payer: Self-pay

## 2021-03-30 DIAGNOSIS — J45909 Unspecified asthma, uncomplicated: Secondary | ICD-10-CM

## 2021-03-30 MED ORDER — ALBUTEROL SULFATE HFA 108 (90 BASE) MCG/ACT IN AERS: 2.0000 | INHALATION_SPRAY | Freq: Four times a day (QID) | RESPIRATORY_TRACT | 2 refills | Status: DC | PRN

## 2021-05-11 ENCOUNTER — Encounter: Payer: Self-pay | Admitting: *Deleted

## 2021-06-04 DIAGNOSIS — Z23 Encounter for immunization: Secondary | ICD-10-CM | POA: Diagnosis not present

## 2021-06-07 DIAGNOSIS — M25561 Pain in right knee: Secondary | ICD-10-CM | POA: Diagnosis not present

## 2021-06-07 DIAGNOSIS — M1711 Unilateral primary osteoarthritis, right knee: Secondary | ICD-10-CM | POA: Diagnosis not present

## 2021-06-07 DIAGNOSIS — M170 Bilateral primary osteoarthritis of knee: Secondary | ICD-10-CM | POA: Diagnosis not present

## 2021-06-07 DIAGNOSIS — M1712 Unilateral primary osteoarthritis, left knee: Secondary | ICD-10-CM | POA: Diagnosis not present

## 2021-06-08 ENCOUNTER — Other Ambulatory Visit: Payer: Self-pay | Admitting: Family Medicine

## 2021-06-08 NOTE — Telephone Encounter (Signed)
LFD 03/14/21 #90 with no refills LOV 02/14/21 NOV 08/22/21

## 2021-06-10 ENCOUNTER — Telehealth: Payer: Self-pay

## 2021-06-10 NOTE — Telephone Encounter (Signed)
Pt needs preoperative clearance and will need to schedule an in office visit for this before her surgery.

## 2021-06-28 ENCOUNTER — Encounter: Payer: Self-pay | Admitting: Gastroenterology

## 2021-07-25 ENCOUNTER — Other Ambulatory Visit: Payer: Self-pay

## 2021-07-25 ENCOUNTER — Ambulatory Visit (INDEPENDENT_AMBULATORY_CARE_PROVIDER_SITE_OTHER): Payer: Medicare Other | Admitting: Registered Nurse

## 2021-07-25 DIAGNOSIS — Z8639 Personal history of other endocrine, nutritional and metabolic disease: Secondary | ICD-10-CM | POA: Diagnosis not present

## 2021-07-25 DIAGNOSIS — R319 Hematuria, unspecified: Secondary | ICD-10-CM

## 2021-07-25 DIAGNOSIS — E663 Overweight: Secondary | ICD-10-CM | POA: Diagnosis not present

## 2021-07-25 DIAGNOSIS — E038 Other specified hypothyroidism: Secondary | ICD-10-CM

## 2021-07-25 DIAGNOSIS — Z01818 Encounter for other preprocedural examination: Secondary | ICD-10-CM

## 2021-07-25 NOTE — Progress Notes (Addendum)
Established Patient Office Visit  Subjective:  Patient ID: Kristen [name], female    DOB: 11/15/1950  Age: 70 y.o. MRN: [id number]  CC:  Chief Complaint  Patient presents with   Pre-op Exam    Patient states she is here for medical clearance    HPI Kristen [name] presents for preoperative clearance.   OA of R knee, managed by [facility] Dr. Veverly Fells  Pursuing R TKA. No previous joint replacement surgeries  Histories reviewed and updated with patient.   Some questions on COVID -  Upcoming travel to wedding in [place] Has been vaccinated x 2, boosted x 2  Past Medical History:  Diagnosis Date   Arthritis    In hands   Hearing aid worn    Bil   History of kidney stones    History of shingles 2007   Shingles shot current   Hypothyroid    Kidney stones, calcium oxalate    Lithrotripsy x1; surgical removal X1  via cystoscopy   Migraine    menopausal   Pneumonia 4-5- yrs ago   "walking pneumonia"    Past Surgical History:  Procedure Laterality Date   COLONOSCOPY     negative ; initially age 71; Dr Olevia Perches   CYSTOSCOPY     X 2; Dr Serita Butcher   CYSTOSCOPY WITH RETROGRADE PYELOGRAM, URETEROSCOPY AND STENT PLACEMENT Left 12/03/2019   Procedure: [place], URETEROSCOPY AND STENT PLACEMENT;  Surgeon: Alexis Frock, MD;  Location: [facility];  Service: Urology;  Laterality: Left;  1 HR   FOOT SURGERY  2012   great toe    HOLMIUM LASER APPLICATION Left [id number]   Procedure: HOLMIUM LASER APPLICATION;  Surgeon: Alexis Frock, MD;  Location: [facility];  Service: Urology;  Laterality: Left;   INJECTION KNEE     LITHOTRIPSY     X1   MOUTH SURGERY     extra bone removed in mouth 30 years ago   TONSILLECTOMY AND ADENOIDECTOMY     WISDOM TOOTH EXTRACTION      Family History  Problem Relation Age of Onset   Dementia Mother    COPD Mother    Lung cancer Mother        smoker   Cancer Father        Colon  and Bladder cancer   Diabetes Father    Hypertension Father    Colon cancer Father    Cancer Paternal Grandfather        prostate/ colon   Colon cancer Paternal Grandfather    Heart failure Paternal Grandmother    Stroke Paternal Grandmother 62   Melanoma Maternal Grandmother    Diabetes Brother     Social History   Socioeconomic History   Marital status: Married    Spouse name: Not on file   Number of children: 2   Years of education: Not on file   Highest education level: Not on file  Occupational History   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)   Tobacco Use   Smoking status: Never   Smokeless tobacco: Never  Vaping Use   Vaping Use: Never used  Substance and Sexual Activity   Alcohol use: Not Currently    Comment: quit 11-07-2019   Drug use: No   Sexual activity: Not Currently    Birth control/protection: Post-menopausal    Comment: 1st intercourse 76 yo-1 partner  Other Topics Concern   Not  on file  Social History Narrative   4 Grandchildren    Hobbies: Environmental education officer and Reading; Pension scheme manager    Social Determinants of Health   Financial Resource Strain: Not on file  Food Insecurity: Not on file  Transportation Needs: Not on file  Physical Activity: Not on file  Stress: Not on file  Social Connections: Not on file  Intimate Partner Violence: Not on file    Outpatient Medications Prior to Visit  Medication Sig Dispense Refill   albuterol (VENTOLIN HFA) 108 (90 Base) MCG/ACT inhaler Inhale 2 puffs into the lungs every 6 (six) hours as needed for wheezing or shortness of breath. 1 each 2   AMBULATORY NON FORMULARY MEDICATION Medication Name: T3/T4 12.5 mcg-25 mcg capsule. Take 1 capsule by mouth every morning 90 capsule 1   Ascorbic Acid (VITAMIN C PO) Take 1 tablet by mouth 2 (two) times daily.      butalbital-aspirin-caffeine (FIORINAL) 50-325-40 MG capsule TAKE ONE CAPSULE BY MOUTH EVERY 6 HOURS AS NEEDED FOR  HEADACHE 30 capsule 1   Calcium Citrate-Vitamin D (CALCIUM CITRATE + PO) Take by mouth. Two times daily     GLUCOSAMINE PO Take 1 tablet by mouth 2 (two) times daily.     indapamide (LOZOL) 1.25 MG tablet Take 1.25 mg by mouth daily.     loratadine (CLARITIN) 10 MG tablet Take 10 mg by mouth daily.     meloxicam (MOBIC) 15 MG tablet TAKE 1 TABLET BY MOUTH EVERY DAY 90 tablet 0   Multiple Vitamin (MULTIVITAMIN) tablet Take 1 tablet by mouth daily. MinRX dietary supplement: 1 by mouth daily     Omega-3 Fatty Acids (FISH OIL) 1200 MG CAPS Take by mouth 2 (two) times daily.     Probiotic Product (PROBIOTIC DAILY PO) Take by mouth.     UNABLE TO FIND Med Name:indepamide 1.25 mg at hs     No facility-administered medications prior to visit.    Allergies  Allergen Reactions   Gantrisin [Sulfisoxazole]     Urticaria with sun exposure    Penicillins     REACTION: as child; ? hives   Red Yeast Rice [Cholestin] Hives   Sulfa Antibiotics     Reaction not definite/ some hives    ROS Review of Systems  Constitutional: Negative.   HENT: Negative.    Eyes: Negative.   Respiratory: Negative.    Cardiovascular: Negative.   Gastrointestinal: Negative.   Genitourinary: Negative.   Musculoskeletal: Negative.   Skin: Negative.   Neurological: Negative.   Psychiatric/Behavioral: Negative.    All other systems reviewed and are negative.    Objective:    Physical Exam Vitals and nursing note reviewed.  Constitutional:      General: She is not in acute distress.    Appearance: Normal appearance. She is normal weight. She is not ill-appearing, toxic-appearing or diaphoretic.  Cardiovascular:     Rate and Rhythm: Normal rate and regular rhythm.     Heart sounds: Normal heart sounds. No murmur heard.   No friction rub. No gallop.  Pulmonary:     Effort: Pulmonary effort is normal. No respiratory distress.     Breath sounds: Normal breath sounds. No stridor. No wheezing, rhonchi or rales.   Chest:     Chest wall: No tenderness.  Skin:    General: Skin is warm and dry.  Neurological:     General: No focal deficit present.     Mental Status: She is alert and oriented to person, place, and  time. Mental status is at baseline.  Psychiatric:        Mood and Affect: Mood normal.        Behavior: Behavior normal.        Thought Content: Thought content normal.        Judgment: Judgment normal.    There were no vitals taken for this visit. Wt Readings from Last 3 Encounters:  02/14/21 153 lb 9.6 oz (69.7 kg)  09/23/20 153 lb (69.4 kg)  08/10/20 150 lb (68 kg)     There are no preventive care reminders to display for this patient.   There are no preventive care reminders to display for this patient.  Lab Results  Component Value Date   TSH 2.00 02/14/2021   Lab Results  Component Value Date   WBC 6.0 02/14/2021   HGB 15.3 (H) 02/14/2021   HCT 43.6 02/14/2021   MCV 92.8 02/14/2021   PLT 172.0 02/14/2021   Lab Results  Component Value Date   NA 139 02/14/2021   K 3.8 02/14/2021   CO2 29 02/14/2021   GLUCOSE 84 02/14/2021   BUN 18 02/14/2021   CREATININE 0.77 02/14/2021   BILITOT 1.2 02/14/2021   ALKPHOS 55 02/14/2021   AST 20 02/14/2021   ALT 18 02/14/2021   PROT 6.7 02/14/2021   ALBUMIN 4.5 02/14/2021   CALCIUM 9.8 02/14/2021   GFR 78.97 02/14/2021   Lab Results  Component Value Date   CHOL 239 (H) 02/14/2021   Lab Results  Component Value Date   HDL 59.10 02/14/2021   Lab Results  Component Value Date   LDLCALC 129 (H) 01/27/2019   Lab Results  Component Value Date   TRIG 219.0 (H) 02/14/2021   Lab Results  Component Value Date   CHOLHDL 4 02/14/2021   No results found for: HGBA1C    Assessment & Plan:   Problem List Items Addressed This Visit       Endocrine   Hypothyroid   Relevant Orders   CBC with Differential/Platelet   Comprehensive metabolic panel   Hemoglobin A1c   TSH   Protime-INR   APTT   Urinalysis      Other   Overweight (BMI 25.0-29.9)   Relevant Orders   CBC with Differential/Platelet   Comprehensive metabolic panel   Hemoglobin A1c   TSH   Protime-INR   APTT   Urinalysis   Other Visit Diagnoses     Preoperative clearance    -  Primary   Relevant Orders   EKG 12-Lead (Completed)   CBC with Differential/Platelet   Comprehensive metabolic panel   Hemoglobin A1c   TSH   Protime-INR   APTT   Urinalysis   Hematuria, unspecified type       Relevant Orders   CBC with Differential/Platelet   Comprehensive metabolic panel   Hemoglobin A1c   TSH   Protime-INR   APTT   Urinalysis   History of elevated glucose       Relevant Orders   Hemoglobin A1c       No orders of the defined types were placed in this encounter.   Follow-up: Return if symptoms worsen or fail to improve.   PLAN EKG compared to reading from 12/19/2012. Reading today shows RRR. No acute abnormalities. No changes from previous reading Labs collected. Will follow up with the patient as warranted. Questions answered and concerns addressed regarding COVID Patient encouraged to call clinic with any questions, comments, or concerns.  In addition  to the EKG, I spent 44 minutes with this patient performing exam, answering questions, and coordinating preoperative testing. Maximiano Coss, NP

## 2021-07-25 NOTE — Patient Instructions (Addendum)
Ms. Marillyn Tesh to meet you  Let's see if labs are normal - if so, we're green lit for surgery!  If not, I'll let you know what we need to do to make that happen.  Thanks,  Rich     If you have lab work done today you will be contacted with your lab results within the next 2 weeks.  If you have not heard from [place] then please contact us. The fastest way to get your results is to register for My Chart.   IF you received an x-ray today, you will receive an invoice from [facility]. Please contact [facility] at [phone] with questions or concerns regarding your invoice.   IF you received labwork today, you will receive an invoice from [facility]. Please contact [facility] at [phone] with questions or concerns regarding your invoice.   Our billing staff will not be able to assist you with questions regarding bills from these companies.  You will be contacted with the lab results as soon as they are available. The fastest way to get your results is to activate your My Chart account. Instructions are located on the last page of this paperwork. If you have not heard from [place] regarding the results in 2 weeks, please contact this office.

## 2021-07-26 LAB — CBC WITH DIFFERENTIAL/PLATELET
Basophils Absolute: 0 10*3/uL (ref 0.0–0.1)
Basophils Relative: 0.7 % (ref 0.0–3.0)
Eosinophils Absolute: 0.1 10*3/uL (ref 0.0–0.7)
Eosinophils Relative: 1 % (ref 0.0–5.0)
HCT: 41 % (ref 36.0–46.0)
Hemoglobin: 14.3 g/dL (ref 12.0–15.0)
Lymphocytes Relative: 27.6 % (ref 12.0–46.0)
Lymphs Abs: 1.9 10*3/uL (ref 0.7–4.0)
MCHC: 34.9 g/dL (ref 30.0–36.0)
MCV: 93.8 fl (ref 78.0–100.0)
Monocytes Absolute: 0.8 10*3/uL (ref 0.1–1.0)
Monocytes Relative: 11.4 % (ref 3.0–12.0)
Neutro Abs: 4.1 10*3/uL (ref 1.4–7.7)
Neutrophils Relative %: 59.3 % (ref 43.0–77.0)
Platelets: 181 10*3/uL (ref 150.0–400.0)
RBC: 4.37 Mil/uL (ref 3.87–5.11)
RDW: 12.7 % (ref 11.5–15.5)
WBC: 6.9 10*3/uL (ref 4.0–10.5)

## 2021-07-26 LAB — PROTIME-INR
INR: 0.6 ratio — ABNORMAL LOW (ref 0.8–1.0)
Prothrombin Time: 6.8 s — ABNORMAL LOW (ref 9.6–13.1)

## 2021-07-26 LAB — URINALYSIS
Bilirubin Urine: NEGATIVE
Hgb urine dipstick: NEGATIVE
Ketones, ur: NEGATIVE
Leukocytes,Ua: NEGATIVE
Nitrite: NEGATIVE
Specific Gravity, Urine: 1.02 (ref 1.000–1.030)
Total Protein, Urine: NEGATIVE
Urine Glucose: NEGATIVE
Urobilinogen, UA: 0.2 (ref 0.0–1.0)
pH: 6 (ref 5.0–8.0)

## 2021-07-26 LAB — COMPREHENSIVE METABOLIC PANEL
ALT: 12 U/L (ref 0–35)
AST: 14 U/L (ref 0–37)
Albumin: 3.3 g/dL — ABNORMAL LOW (ref 3.5–5.2)
Alkaline Phosphatase: 40 U/L (ref 39–117)
BUN: 25 mg/dL — ABNORMAL HIGH (ref 6–23)
CO2: 23 mEq/L (ref 19–32)
Calcium: 7.2 mg/dL — ABNORMAL LOW (ref 8.4–10.5)
Chloride: 98 mEq/L (ref 96–112)
Creatinine, Ser: 0.68 mg/dL (ref 0.40–1.20)
GFR: 88.88 mL/min (ref >60.00)
Glucose, Bld: 73 mg/dL (ref 70–99)
Potassium: 3.3 mEq/L — ABNORMAL LOW (ref 3.5–5.1)
Sodium: 131 mEq/L — ABNORMAL LOW (ref 135–145)
Total Bilirubin: 0.7 mg/dL (ref 0.2–1.2)
Total Protein: 5.5 g/dL — ABNORMAL LOW (ref 6.0–8.3)

## 2021-07-26 LAB — APTT: aPTT: 29.3 s (ref 23.4–32.7)

## 2021-07-26 LAB — HEMOGLOBIN A1C: Hgb A1c MFr Bld: 4.8 % (ref 4.6–6.5)

## 2021-07-26 LAB — TSH: TSH: 0.83 u[IU]/mL (ref 0.35–5.50)

## 2021-08-05 ENCOUNTER — Other Ambulatory Visit: Payer: Self-pay | Admitting: Registered Nurse

## 2021-08-05 DIAGNOSIS — R791 Abnormal coagulation profile: Secondary | ICD-10-CM

## 2021-08-05 DIAGNOSIS — Z01818 Encounter for other preprocedural examination: Secondary | ICD-10-CM

## 2021-08-08 ENCOUNTER — Other Ambulatory Visit: Payer: Medicare Other

## 2021-08-08 ENCOUNTER — Ambulatory Visit: Payer: Medicare Other | Admitting: Family Medicine

## 2021-08-09 ENCOUNTER — Other Ambulatory Visit (INDEPENDENT_AMBULATORY_CARE_PROVIDER_SITE_OTHER): Payer: Medicare Other

## 2021-08-09 ENCOUNTER — Other Ambulatory Visit: Payer: Self-pay

## 2021-08-09 DIAGNOSIS — Z01818 Encounter for other preprocedural examination: Secondary | ICD-10-CM

## 2021-08-09 DIAGNOSIS — R791 Abnormal coagulation profile: Secondary | ICD-10-CM

## 2021-08-09 LAB — COMPREHENSIVE METABOLIC PANEL
ALT: 18 U/L (ref 0–35)
AST: 19 U/L (ref 0–37)
Albumin: 4.4 g/dL (ref 3.5–5.2)
Alkaline Phosphatase: 57 U/L (ref 39–117)
BUN: 22 mg/dL (ref 6–23)
CO2: 27 mEq/L (ref 19–32)
Calcium: 9.3 mg/dL (ref 8.4–10.5)
Chloride: 103 mEq/L (ref 96–112)
Creatinine, Ser: 0.75 mg/dL (ref 0.40–1.20)
GFR: 81.23 mL/min (ref >60.00)
Glucose, Bld: 85 mg/dL (ref 70–99)
Potassium: 3.5 mEq/L (ref 3.5–5.1)
Sodium: 139 mEq/L (ref 135–145)
Total Bilirubin: 1.8 mg/dL — ABNORMAL HIGH (ref 0.2–1.2)
Total Protein: 6.3 g/dL (ref 6.0–8.3)

## 2021-08-09 LAB — PROTIME-INR
INR: 0.9 ratio (ref 0.8–1.0)
Prothrombin Time: 9.9 s (ref 9.6–13.1)

## 2021-08-15 ENCOUNTER — Ambulatory Visit: Payer: Medicare Other | Admitting: Family Medicine

## 2021-08-22 ENCOUNTER — Encounter: Payer: Self-pay | Admitting: Family Medicine

## 2021-08-22 ENCOUNTER — Ambulatory Visit (INDEPENDENT_AMBULATORY_CARE_PROVIDER_SITE_OTHER): Payer: Medicare Other | Admitting: Family Medicine

## 2021-08-22 ENCOUNTER — Other Ambulatory Visit: Payer: Self-pay

## 2021-08-22 VITALS — BP 118/78 | HR 61 | Temp 97.8°F | Resp 17 | Ht 63.5 in | Wt 152.8 lb

## 2021-08-22 DIAGNOSIS — E038 Other specified hypothyroidism: Secondary | ICD-10-CM | POA: Diagnosis not present

## 2021-08-22 DIAGNOSIS — E785 Hyperlipidemia, unspecified: Secondary | ICD-10-CM

## 2021-08-22 DIAGNOSIS — K635 Polyp of colon: Secondary | ICD-10-CM | POA: Insufficient documentation

## 2021-08-22 DIAGNOSIS — Z23 Encounter for immunization: Secondary | ICD-10-CM | POA: Diagnosis not present

## 2021-08-22 DIAGNOSIS — D126 Benign neoplasm of colon, unspecified: Secondary | ICD-10-CM

## 2021-08-22 LAB — LIPID PANEL
Cholesterol: 244 mg/dL — ABNORMAL HIGH (ref 0–200)
HDL: 56.1 mg/dL (ref >39.00)
NonHDL: 187.7
Total CHOL/HDL Ratio: 4
Triglycerides: 281 mg/dL — ABNORMAL HIGH (ref 0.0–149.0)
VLDL: 56.2 mg/dL — ABNORMAL HIGH (ref 0.0–40.0)

## 2021-08-22 LAB — LDL CHOLESTEROL, DIRECT: Direct LDL: 156 mg/dL

## 2021-08-22 NOTE — Progress Notes (Signed)
   Subjective:    Patient ID: Kristen [name], female    DOB: 1950/11/16, 70 y.o.   MRN: [id number]  HPI Hyperlipidemia- ongoing issue.  Pt is attempting to control w/ diet and exercise.  Also taking Omega 3.  Last LDL 158  Has knee replacement upcoming.  Wants to increase walking after surgery.  No CP, SOB, abd pain, N/V.  Hypothyroid- chronic problem, on compounded T3/T4.  Recent TSH WNL.  Energy level is good, no changes to skin/hair/nails.  Due for colonoscopy- sent reminder letter from Dr Havery Moros due to polyps on colonoscopy done 2017.  Never got letter.   Review of Systems For ROS see HPI   This visit occurred during the SARS-CoV-2 public health emergency.  Safety protocols were in place, including screening questions prior to the visit, additional usage of staff PPE, and extensive cleaning of exam room while observing appropriate contact time as indicated for disinfecting solutions.      Objective:   Physical Exam Vitals reviewed.  Constitutional:      General: She is not in acute distress.    Appearance: Normal appearance. She is well-developed. She is not ill-appearing.  HENT:     Head: Normocephalic and atraumatic.  Eyes:     Conjunctiva/sclera: Conjunctivae normal.     Pupils: Pupils are equal, round, and reactive to light.  Neck:     Thyroid: No thyromegaly.  Cardiovascular:     Rate and Rhythm: Normal rate and regular rhythm.     Pulses: Normal pulses.     Heart sounds: Normal heart sounds. No murmur heard. Pulmonary:     Effort: Pulmonary effort is normal. No respiratory distress.     Breath sounds: Normal breath sounds.  Abdominal:     General: There is no distension.     Palpations: Abdomen is soft.     Tenderness: There is no abdominal tenderness.  Musculoskeletal:     Cervical back: Normal range of motion and neck supple.     Right lower leg: No edema.     Left lower leg: No edema.  Lymphadenopathy:     Cervical: No cervical adenopathy.  Skin:     General: Skin is warm and dry.  Neurological:     General: No focal deficit present.     Mental Status: She is alert and oriented to person, place, and time.  Psychiatric:        Mood and Affect: Mood normal.        Behavior: Behavior normal.          Assessment & Plan:

## 2021-08-22 NOTE — Patient Instructions (Signed)
Follow up in 6 months to recheck thyroid and cholesterol We'll notify you of your lab results and make any changes if needed Keep up the good work on healthy diet and regular exercise- you look great! We'll call you to schedule your colonoscopy Call with any questions or concerns GOOD LUCK WITH SURGERY!!

## 2021-08-23 NOTE — H&P (Signed)
Patient's anticipated LOS is less than 2 midnights, meeting these requirements: - Younger than 100 - Lives within 1 hour of care - Has a competent adult at home to recover with post-op recover - NO history of  - Chronic pain requiring opiods  - Diabetes  - Coronary Artery Disease  - Heart failure  - Heart attack  - Stroke  - DVT/VTE  - Cardiac arrhythmia  - Respiratory Failure/COPD  - Renal failure  - Anemia  - Advanced Liver disease     Kristen [name] is an 70 y.o. female.    Chief Complaint: right knee pain  HPI: Pt is a 70 y.o. female complaining of right knee pain for multiple years. Pain had continually increased since the beginning. X-rays in the clinic show end-stage arthritic changes of the right knee. Pt has tried various conservative treatments which have failed to alleviate their symptoms, including injections and therapy. Various options are discussed with the patient. Risks, benefits and expectations were discussed with the patient. Patient understand the risks, benefits and expectations and wishes to proceed with surgery.   PCP:  Midge Minium, MD  D/C Plans: Home  PMH: Past Medical History:  Diagnosis Date   Arthritis    In hands   Hearing aid worn    Bil   History of kidney stones    History of shingles 2007   Shingles shot current   Hypothyroid    Kidney stones, calcium oxalate    Lithrotripsy x1; surgical removal X1  via cystoscopy   Migraine    menopausal   Pneumonia 4-5- yrs ago   "walking pneumonia"    PSH: Past Surgical History:  Procedure Laterality Date   COLONOSCOPY     negative ; initially age 69; Dr Olevia Perches   CYSTOSCOPY     X 2; Dr Serita Butcher   CYSTOSCOPY WITH RETROGRADE PYELOGRAM, URETEROSCOPY AND STENT PLACEMENT Left 12/03/2019   Procedure: [place], URETEROSCOPY AND STENT PLACEMENT;  Surgeon: Alexis Frock, MD;  Location: [facility];  Service: Urology;  Laterality: Left;  1 HR    FOOT SURGERY  2012   great toe    HOLMIUM LASER APPLICATION Left 06/20/8109   Procedure: HOLMIUM LASER APPLICATION;  Surgeon: Alexis Frock, MD;  Location: [facility];  Service: Urology;  Laterality: Left;   INJECTION KNEE     LITHOTRIPSY     X1   MOUTH SURGERY     extra bone removed in mouth 30 years ago   TONSILLECTOMY AND ADENOIDECTOMY     WISDOM TOOTH EXTRACTION      Social History:  reports that she has never smoked. She has never used smokeless tobacco. She reports that she does not currently use alcohol. She reports that she does not use drugs.  Allergies:  Allergies  Allergen Reactions   Gantrisin [Sulfisoxazole]     Urticaria with sun exposure    Penicillins     REACTION: as child; ? hives   Red Yeast Rice [Cholestin] Hives   Sulfa Antibiotics     Reaction not definite/ some hives    Medications: No current facility-administered medications for this encounter.   Current Outpatient Medications  Medication Sig Dispense Refill   albuterol (VENTOLIN HFA) 108 (90 Base) MCG/ACT inhaler Inhale 2 puffs into the lungs every 6 (six) hours as needed for wheezing or shortness of breath. 1 each 2   AMBULATORY NON FORMULARY MEDICATION Medication Name: T3/T4 12.5 mcg-25 mcg capsule. Take 1 capsule  by mouth every morning 90 capsule 1   Ascorbic Acid (VITAMIN C PO) Take 1 tablet by mouth 2 (two) times daily.      butalbital-aspirin-caffeine (FIORINAL) 50-325-40 MG capsule TAKE ONE CAPSULE BY MOUTH EVERY 6 HOURS AS NEEDED FOR HEADACHE 30 capsule 1   Calcium Citrate-Vitamin D (CALCIUM CITRATE + PO) Take by mouth. Two times daily     GLUCOSAMINE PO Take 1 tablet by mouth 2 (two) times daily.     indapamide (LOZOL) 1.25 MG tablet Take 1.25 mg by mouth daily.     loratadine (CLARITIN) 10 MG tablet Take 10 mg by mouth daily.     meloxicam (MOBIC) 15 MG tablet TAKE 1 TABLET BY MOUTH EVERY DAY 90 tablet 0   Multiple Vitamin (MULTIVITAMIN) tablet Take 1 tablet by mouth  daily. MinRX dietary supplement: 1 by mouth daily     Omega-3 Fatty Acids (FISH OIL) 1200 MG CAPS Take by mouth 2 (two) times daily.     Probiotic Product (PROBIOTIC DAILY PO) Take by mouth.      Results for orders placed or performed in visit on 08/22/21 (from the past 48 hour(s))  Lipid panel     Status: Abnormal   Collection Time: 08/22/21  9:15 AM  Result Value Ref Range   Cholesterol 244 (H) 0 - 200 mg/dL    Comment: ATP III Classification       Desirable:  < 200 mg/dL               Borderline High:  200 - 239 mg/dL          High:  > = 240 mg/dL   Triglycerides 281.0 (H) 0.0 - 149.0 mg/dL    Comment: Normal:  <150 mg/dLBorderline High:  150 - 199 mg/dL   HDL 56.10 >39.00 mg/dL   VLDL 56.2 (H) 0.0 - 40.0 mg/dL   Total CHOL/HDL Ratio 4     Comment:                Men          Women1/2 Average Risk     3.4          3.3Average Risk          5.0          4.42X Average Risk          9.6          7.13X Average Risk          15.0          11.0                       NonHDL 187.70     Comment: NOTE:  Non-HDL goal should be 30 mg/dL higher than patient's LDL goal (i.e. LDL goal of < 70 mg/dL, would have non-HDL goal of < 100 mg/dL)  LDL cholesterol, direct     Status: None   Collection Time: 08/22/21  9:15 AM  Result Value Ref Range   Direct LDL 156.0 mg/dL    Comment: Optimal:  <100 mg/dLNear or Above Optimal:  100-129 mg/dLBorderline High:  130-159 mg/dLHigh:  160-189 mg/dLVery High:  >190 mg/dL   No results found.  ROS: Pain with rom of the right lower extremity  Physical Exam: Alert and oriented 70 y.o. female in no acute distress Cranial nerves 2-12 intact Cervical spine: full rom with no tenderness, nv intact distally Chest: active breath sounds bilaterally, no wheeze  rhonchi or rales Heart: regular rate and rhythm, no murmur Abd: non tender non distended with active bowel sounds Hip is stable with rom  Right knee painful rom with crepitus  Nv intact distally No rashes or  edema distally Antalgic gait  Assessment/Plan Assessment: right knee end stage osteoarthritis  Plan:  Patient will undergo a right total knee by Dr. Veverly Fells at [facility] Risks benefits and expectations were discussed with the patient. Patient understand risks, benefits and expectations and wishes to proceed. Preoperative templating of the joint replacement has been completed, documented, and submitted to the Operating Room personnel in order to optimize intra-operative equipment management.   Merla Riches PA-C, MPAS [facility] is now [employer] [address], [place], [place],  [address] Phone: [phone] [email or web address]  [employer]

## 2021-08-26 DIAGNOSIS — Z23 Encounter for immunization: Secondary | ICD-10-CM | POA: Diagnosis not present

## 2021-08-26 NOTE — Patient Instructions (Addendum)
DUE TO COVID-19 ONLY ONE VISITOR IS ALLOWED TO COME WITH YOU AND STAY IN THE WAITING ROOM ONLY DURING PRE OP AND PROCEDURE DAY OF SURGERY IF YOU ARE GOING HOME AFTER SURGERY. IF YOU ARE SPENDING THE NIGHT 2 PEOPLE MAY VISIT WITH YOU IN YOUR PRIVATE ROOM AFTER SURGERY UNTIL VISITING  HOURS ARE OVER AT 8:00 PM AND 1 VISITOR CAN SPEND THE NIGHT.   YOU NEED TO HAVE A COVID 19 TEST ON__10/19___THIS TEST MUST BE DONE BEFORE SURGERY,  COVID TESTING SITE  IS LOCATED AT [place], [place]. REMAIN IN YOUR CAR THIS IS A DRIVE UP TEST. AFTER YOUR COVID TEST PLEASE WEAR A MASK OUT IN PUBLIC AND SOCIAL DISTANCE AND [place] YOUR HANDS FREQUENTLY, ALSO ASK ALL YOUR CLOSE CONTACT PERSONS TO WEAR A MASK AND SOCIAL DISTANCE AND [place] THEIR HANDS FREQUENTLY ALSO.               Kristen [name]     Your procedure is scheduled on: 09/02/21   Report to [facility] Main  Entrance   Report to short stay at 5:15 AM     Call this number if you have problems the morning of surgery [phone]    No food after midnight.    You may have clear liquid until 4:30 AM.    At 4:00 AM drink pre surgery drink.   Nothing by mouth after 4:30 AM.   CLEAR LIQUID DIET   Foods Allowed                                                                     Foods Excluded                                                                                          liquids that you cannot  Plain Jell-O any favor except red or purple                                           see through such as: Fruit ices (not with fruit pulp)                                     milk, soups, orange juice  Iced Popsicles                                    All solid food Carbonated beverages, regular and diet  Cranberry, grape and apple juices Sports drinks like Gatorade Lightly seasoned clear broth or consume(fat free) Sugar    BRUSH YOUR TEETH MORNING OF SURGERY AND RINSE YOUR MOUTH OUT, NO  CHEWING GUM CANDY OR MINTS.     Take these medicines the morning of surgery with A SIP OF WATER:  use your inhaler and bring it with you. Take the thyroid med                                You may not have any metal on your body including hair pins and              piercings  Do not wear jewelry, make-up, lotions, powders or perfumes, deodorant             Do not wear nail polish on your fingernails.  Do not shave  48 hours prior to surgery.                Do not bring valuables to the hospital. [place].  Contacts, dentures or bridgework may not be worn into surgery.  Leave suitcase in the car. After surgery it may be brought to your room.      Name and phone number of your driver:  Special Instructions: N/A              Please read over the following fact sheets you were given: _____________________________________________________________________             [facility] - Preparing for Surgery Before surgery, you can play an important role.  Because skin is not sterile, your skin needs to be as free of germs as possible.  You can reduce the number of germs on your skin by washing with CHG (chlorahexidine gluconate) soap before surgery.  CHG is an antiseptic cleaner which kills germs and bonds with the skin to continue killing germs even after washing. Please DO NOT use if you have an allergy to CHG or antibacterial soaps.  If your skin becomes reddened/irritated stop using the CHG and inform your nurse when you arrive at Short Stay. Do not shave (including legs and underarms) for at least 48 hours prior to the first CHG shower.   Please follow these instructions carefully:  1.  Shower with CHG Soap the night before surgery and the  morning of Surgery.  2.  If you choose to wash your hair, wash your hair first as usual with your  normal  shampoo.  3.  After you shampoo, rinse your hair and body thoroughly to remove the  shampoo.                             4.  Use CHG as you would any other liquid soap.  You can apply chg directly  to the skin and wash                       Gently with a scrungie or clean washcloth.  5.  Apply the CHG Soap to your body ONLY FROM THE NECK DOWN.   Do not use on face/ open  Wound or open sores. Avoid contact with eyes, ears mouth and genitals (private parts).                       Wash face,  Genitals (private parts) with your normal soap.             6.  Wash thoroughly, paying special attention to the area where your surgery  will be performed.  7.  Thoroughly rinse your body with warm water from the neck down.  8.  DO NOT shower/wash with your normal soap after using and rinsing off  the CHG Soap.                9.  Pat yourself dry with a clean towel.            10.  Wear clean pajamas.            11.  Place clean sheets on your bed the night of your first shower and do not  sleep with pets. Day of Surgery : Do not apply any lotions/deodorants the morning of surgery.  Please wear clean clothes to the hospital/surgery center.  FAILURE TO FOLLOW THESE INSTRUCTIONS MAY RESULT IN THE CANCELLATION OF YOUR SURGERY PATIENT SIGNATURE_________________________________  NURSE SIGNATURE__________________________________  ________________________________________________________________________   Kristen [name]  An incentive spirometer is a tool that can help keep your lungs clear and active. This tool measures how well you are filling your lungs with each breath. Taking long deep breaths may help reverse or decrease the chance of developing breathing (pulmonary) problems (especially infection) following: A long period of time when you are unable to move or be active. BEFORE THE PROCEDURE  If the spirometer includes an indicator to show your best effort, your nurse or respiratory therapist will set it to a desired goal. If possible, sit up straight or lean slightly  forward. Try not to slouch. Hold the incentive spirometer in an upright position. INSTRUCTIONS FOR USE  Sit on the edge of your bed if possible, or sit up as far as you can in bed or on a chair. Hold the incentive spirometer in an upright position. Breathe out normally. Place the mouthpiece in your mouth and seal your lips tightly around it. Breathe in slowly and as deeply as possible, raising the piston or the ball toward the top of the column. Hold your breath for 3-5 seconds or for as long as possible. Allow the piston or ball to fall to the bottom of the column. Remove the mouthpiece from your mouth and breathe out normally. Rest for a few seconds and repeat Steps 1 through 7 at least 10 times every 1-2 hours when you are awake. Take your time and take a few normal breaths between deep breaths. The spirometer may include an indicator to show your best effort. Use the indicator as a goal to work toward during each repetition. After each set of 10 deep breaths, practice coughing to be sure your lungs are clear. If you have an incision (the cut made at the time of surgery), support your incision when coughing by placing a pillow or rolled up towels firmly against it. Once you are able to get out of bed, walk around indoors and cough well. You may stop using the incentive spirometer when instructed by your caregiver.  RISKS AND COMPLICATIONS Take your time so you do not get dizzy or light-headed. If you are in pain, you may need to take or ask  for pain medication before doing incentive spirometry. It is harder to take a deep breath if you are having pain. AFTER USE Rest and breathe slowly and easily. It can be helpful to keep track of a log of your progress. Your caregiver can provide you with a simple table to help with this. If you are using the spirometer at home, follow these instructions: [place] IF:  You are having difficultly using the spirometer. You have trouble using the  spirometer as often as instructed. Your pain medication is not giving enough relief while using the spirometer. You develop fever of 100.5 F (38.1 C) or higher. SEEK IMMEDIATE MEDICAL CARE IF:  You cough up bloody sputum that had not been present before. You develop fever of 102 F (38.9 C) or greater. You develop worsening pain at or near the incision site. MAKE SURE YOU:  Understand these instructions. Will watch your condition. Will get help right away if you are not doing well or get worse. Document Released: 03/12/2007 Document Revised: 01/22/2012 Document Reviewed: 05/13/2007 [facility] Patient Information 2014 [place], [place].   ________________________________________________________________________

## 2021-08-29 ENCOUNTER — Encounter (HOSPITAL_COMMUNITY): Payer: Self-pay

## 2021-08-29 ENCOUNTER — Encounter (HOSPITAL_COMMUNITY)
Admission: RE | Admit: 2021-08-29 | Discharge: 2021-08-29 | Disposition: A | Discharge status: Home / Self Care | Payer: Medicare Other | Source: Ambulatory Visit | Attending: Orthopedic Surgery | Admitting: Orthopedic Surgery

## 2021-08-29 ENCOUNTER — Other Ambulatory Visit: Payer: Self-pay

## 2021-08-29 DIAGNOSIS — Z01812 Encounter for preprocedural laboratory examination: Secondary | ICD-10-CM | POA: Diagnosis not present

## 2021-08-29 HISTORY — DX: Unspecified asthma, uncomplicated: J45.909

## 2021-08-29 LAB — BASIC METABOLIC PANEL
Anion gap: 8 (ref 5–15)
BUN: 21 mg/dL (ref 8–23)
CO2: 26 mmol/L (ref 22–32)
Calcium: 9.6 mg/dL (ref 8.9–10.3)
Chloride: 103 mmol/L (ref 98–111)
Creatinine, Ser: 0.75 mg/dL (ref 0.44–1.00)
GFR, Estimated: >60 mL/min (ref >60)
Glucose, Bld: 94 mg/dL (ref 70–99)
Potassium: 3.2 mmol/L — ABNORMAL LOW (ref 3.5–5.1)
Sodium: 137 mmol/L (ref 135–145)

## 2021-08-29 LAB — CBC
HCT: 43.9 % (ref 36.0–46.0)
Hemoglobin: 15.7 g/dL — ABNORMAL HIGH (ref 12.0–15.0)
MCH: 32.9 pg (ref 26.0–34.0)
MCHC: 35.8 g/dL (ref 30.0–36.0)
MCV: 92 fL (ref 80.0–100.0)
Platelets: 190 10*3/uL (ref 150–400)
RBC: 4.77 MIL/uL (ref 3.87–5.11)
RDW: 11.7 % (ref 11.5–15.5)
WBC: 7.1 10*3/uL (ref 4.0–10.5)
nRBC: 0 % (ref 0.0–0.2)

## 2021-08-29 LAB — SURGICAL PCR SCREEN
MRSA, PCR: NEGATIVE
Staphylococcus aureus: NEGATIVE

## 2021-08-29 NOTE — Progress Notes (Signed)
COVID  test- 08/31/21  PCP - Dr. Raliegh Ip. Tabori LOV 1010/22 Cardiologist - none  Chest x-ray - no EKG - 07/25/21-epic Stress Test - no ECHO - no Cardiac Cath - NA Pacemaker/ICD device last checked:NA  Sleep Study - no CPAP - no  Fasting Blood Sugar - NA Checks Blood Sugar _____ times a day  Blood Thinner Instructions:NA Aspirin Instructions: Last Dose:  Anesthesia review: no  Patient denies shortness of breath, fever, cough and chest pain at PAT appointment Pt has no SOB with any activities  Patient verbalized understanding of instructions that were given to them at the PAT appointment. Patient was also instructed that they will need to review over the PAT instructions again at home before surgery. yes

## 2021-08-31 ENCOUNTER — Other Ambulatory Visit: Payer: Self-pay | Admitting: Orthopedic Surgery

## 2021-08-31 LAB — SARS CORONAVIRUS 2 (TAT 6-24 HRS): SARS Coronavirus 2: NEGATIVE

## 2021-09-01 ENCOUNTER — Encounter (HOSPITAL_COMMUNITY): Payer: Self-pay | Admitting: Orthopedic Surgery

## 2021-09-01 NOTE — Anesthesia Preprocedure Evaluation (Addendum)
Anesthesia Evaluation  Patient identified by MRN, date of birth, ID band Patient awake    Reviewed: Allergy & Precautions, NPO status , Patient's Chart, lab work & pertinent test results  Airway Mallampati: III  TM Distance: <3 FB Neck ROM: Full    Dental no notable dental hx. (+) Teeth Intact, Dental Advisory Given, Caps,    Pulmonary asthma , pneumonia,    Pulmonary exam normal breath sounds clear to auscultation       Cardiovascular negative cardio ROS Normal cardiovascular exam Rhythm:Regular Rate:Normal     Neuro/Psych  Headaches, negative psych ROS   GI/Hepatic negative GI ROS, Neg liver ROS,   Endo/Other  Hypothyroidism   Renal/GU Renal disease     Musculoskeletal  (+) Arthritis ,   Abdominal   Peds  Hematology negative hematology ROS (+)   Anesthesia Other Findings   Reproductive/Obstetrics negative OB ROS                            Anesthesia Physical  Anesthesia Plan  ASA: 2  Anesthesia Plan: Spinal   Post-op Pain Management:  Regional for Post-op pain   Induction: Intravenous  PONV Risk Score and Plan: 3 and Ondansetron, Treatment may vary due to age or medical condition, Midazolam, Propofol infusion and TIVA  Airway Management Planned: Natural Airway  Additional Equipment: None  Intra-op Plan:   Post-operative Plan:   Informed Consent: I have reviewed the patients History and Physical, chart, labs and discussed the procedure including the risks, benefits and alternatives for the proposed anesthesia with the patient or authorized representative who has indicated his/her understanding and acceptance.     Dental advisory given  Plan Discussed with: CRNA  Anesthesia Plan Comments:        Anesthesia Quick Evaluation

## 2021-09-02 ENCOUNTER — Encounter (HOSPITAL_COMMUNITY): Payer: Self-pay | Admitting: Orthopedic Surgery

## 2021-09-02 ENCOUNTER — Ambulatory Visit (HOSPITAL_COMMUNITY): Payer: Medicare Other | Admitting: Certified Registered"

## 2021-09-02 ENCOUNTER — Observation Stay (HOSPITAL_COMMUNITY)
Admission: RE | Admit: 2021-09-02 | Discharge: 2021-09-04 | Disposition: A | Discharge status: Home / Self Care | Payer: Medicare Other | Source: Ambulatory Visit | Attending: Orthopedic Surgery | Admitting: Orthopedic Surgery

## 2021-09-02 ENCOUNTER — Other Ambulatory Visit: Payer: Self-pay

## 2021-09-02 ENCOUNTER — Encounter (HOSPITAL_COMMUNITY)
Admission: RE | Disposition: A | Discharge status: Home / Self Care | Payer: Self-pay | Source: Ambulatory Visit | Attending: Orthopedic Surgery

## 2021-09-02 DIAGNOSIS — M1711 Unilateral primary osteoarthritis, right knee: Secondary | ICD-10-CM | POA: Diagnosis not present

## 2021-09-02 DIAGNOSIS — Z79899 Other long term (current) drug therapy: Secondary | ICD-10-CM | POA: Insufficient documentation

## 2021-09-02 DIAGNOSIS — G8918 Other acute postprocedural pain: Secondary | ICD-10-CM | POA: Diagnosis not present

## 2021-09-02 DIAGNOSIS — Z96651 Presence of right artificial knee joint: Secondary | ICD-10-CM

## 2021-09-02 DIAGNOSIS — E785 Hyperlipidemia, unspecified: Secondary | ICD-10-CM | POA: Diagnosis not present

## 2021-09-02 DIAGNOSIS — E039 Hypothyroidism, unspecified: Secondary | ICD-10-CM | POA: Diagnosis not present

## 2021-09-02 HISTORY — PX: TOTAL KNEE ARTHROPLASTY: SHX125

## 2021-09-02 SURGERY — ARTHROPLASTY, KNEE, TOTAL
Anesthesia: Spinal | Site: Knee | Laterality: Right

## 2021-09-02 MED ORDER — METHOCARBAMOL 500 MG PO TABS: 500.0000 mg | ORAL_TABLET | Freq: Three times a day (TID) | ORAL | 1 refills | Status: DC | PRN

## 2021-09-02 MED ORDER — MIDAZOLAM HCL 2 MG/2ML IJ SOLN
INTRAMUSCULAR | Status: AC
  Filled 2021-09-02: qty 2

## 2021-09-02 MED ORDER — PHENOL 1.4 % MT LIQD: 1.0000 | OROMUCOSAL | Status: DC | PRN

## 2021-09-02 MED ORDER — DEXAMETHASONE SODIUM PHOSPHATE 4 MG/ML IJ SOLN
INTRAMUSCULAR | Status: DC | PRN
  Administered 2021-09-02: 5 mg via PERINEURAL

## 2021-09-02 MED ORDER — BUPIVACAINE LIPOSOME 1.3 % IJ SUSP
INTRAMUSCULAR | Status: DC | PRN
  Administered 2021-09-02: 20 mL

## 2021-09-02 MED ORDER — DEXAMETHASONE SODIUM PHOSPHATE 10 MG/ML IJ SOLN
INTRAMUSCULAR | Status: DC | PRN
  Administered 2021-09-02: 10 mg via INTRAVENOUS

## 2021-09-02 MED ORDER — BUPIVACAINE IN DEXTROSE 0.75-8.25 % IT SOLN
INTRATHECAL | Status: DC | PRN
  Administered 2021-09-02: 1.6 mL via INTRATHECAL

## 2021-09-02 MED ORDER — MELOXICAM 15 MG PO TABS
15.0000 mg | ORAL_TABLET | Freq: Every day | ORAL | Status: DC
  Administered 2021-09-03 – 2021-09-04 (×2): 15 mg via ORAL
  Filled 2021-09-02 (×2): qty 1

## 2021-09-02 MED ORDER — LORATADINE 10 MG PO TABS: 10.0000 mg | ORAL_TABLET | Freq: Every day | ORAL | Status: DC | PRN

## 2021-09-02 MED ORDER — MEPERIDINE HCL 50 MG/ML IJ SOLN: 6.2500 mg | INTRAMUSCULAR | Status: DC | PRN

## 2021-09-02 MED ORDER — ACETAMINOPHEN 325 MG PO TABS
325.0000 mg | ORAL_TABLET | Freq: Four times a day (QID) | ORAL | Status: DC | PRN
  Administered 2021-09-03 – 2021-09-04 (×3): 650 mg via ORAL
  Filled 2021-09-02 (×3): qty 2

## 2021-09-02 MED ORDER — PHENYLEPHRINE HCL (PRESSORS) 10 MG/ML IV SOLN
INTRAVENOUS | Status: AC
  Filled 2021-09-02: qty 1

## 2021-09-02 MED ORDER — ONDANSETRON HCL 4 MG PO TABS: 4.0000 mg | ORAL_TABLET | Freq: Four times a day (QID) | ORAL | Status: DC | PRN

## 2021-09-02 MED ORDER — WATER FOR IRRIGATION, STERILE IR SOLN
Status: DC | PRN
  Administered 2021-09-02: 2000 mL

## 2021-09-02 MED ORDER — PROPOFOL 500 MG/50ML IV EMUL
INTRAVENOUS | Status: DC | PRN
  Administered 2021-09-02: 75 ug/kg/min via INTRAVENOUS

## 2021-09-02 MED ORDER — BUPIVACAINE LIPOSOME 1.3 % IJ SUSP: 20.0000 mL | Freq: Once | INTRAMUSCULAR | Status: DC

## 2021-09-02 MED ORDER — BISACODYL 10 MG RE SUPP: 10.0000 mg | Freq: Every day | RECTAL | Status: DC | PRN

## 2021-09-02 MED ORDER — MENTHOL 3 MG MT LOZG: 1.0000 | LOZENGE | OROMUCOSAL | Status: DC | PRN

## 2021-09-02 MED ORDER — FENTANYL CITRATE (PF) 250 MCG/5ML IJ SOLN
INTRAMUSCULAR | Status: AC
  Filled 2021-09-02: qty 5

## 2021-09-02 MED ORDER — BUPIVACAINE-EPINEPHRINE (PF) 0.25% -1:200000 IJ SOLN
INTRAMUSCULAR | Status: AC
  Filled 2021-09-02: qty 30

## 2021-09-02 MED ORDER — ASCORBIC ACID 500 MG PO TABS
500.0000 mg | ORAL_TABLET | Freq: Two times a day (BID) | ORAL | Status: DC
  Administered 2021-09-02 – 2021-09-04 (×4): 500 mg via ORAL
  Filled 2021-09-02 (×4): qty 1

## 2021-09-02 MED ORDER — VANCOMYCIN HCL IN DEXTROSE 1-5 GM/200ML-% IV SOLN
1000.0000 mg | INTRAVENOUS | Status: AC
  Administered 2021-09-02: 1000 mg via INTRAVENOUS
  Filled 2021-09-02: qty 200

## 2021-09-02 MED ORDER — SODIUM CHLORIDE 0.9 % IV SOLN: INTRAVENOUS | Status: DC

## 2021-09-02 MED ORDER — CHLORHEXIDINE GLUCONATE 0.12 % MT SOLN: 15.0000 mL | Freq: Once | OROMUCOSAL | Status: DC

## 2021-09-02 MED ORDER — MAGNESIUM OXIDE -MG SUPPLEMENT 400 (240 MG) MG PO TABS
400.0000 mg | ORAL_TABLET | Freq: Two times a day (BID) | ORAL | Status: DC
  Administered 2021-09-02 – 2021-09-04 (×4): 400 mg via ORAL
  Filled 2021-09-02 (×4): qty 1

## 2021-09-02 MED ORDER — ACETAMINOPHEN 500 MG PO TABS: 1000.0000 mg | ORAL_TABLET | Freq: Once | ORAL | Status: DC

## 2021-09-02 MED ORDER — CLONIDINE HCL (ANALGESIA) 100 MCG/ML EP SOLN
EPIDURAL | Status: DC | PRN
  Administered 2021-09-02: 80 ug

## 2021-09-02 MED ORDER — INDAPAMIDE 1.25 MG PO TABS
1.2500 mg | ORAL_TABLET | Freq: Every day | ORAL | Status: DC
  Administered 2021-09-03 – 2021-09-04 (×2): 1.25 mg via ORAL
  Filled 2021-09-02 (×2): qty 1

## 2021-09-02 MED ORDER — CALCIUM CITRATE 950 (200 CA) MG PO TABS
950.0000 mg | ORAL_TABLET | Freq: Every day | ORAL | Status: DC
  Administered 2021-09-03 – 2021-09-04 (×2): 950 mg via ORAL
  Filled 2021-09-02 (×2): qty 1

## 2021-09-02 MED ORDER — PROPOFOL 10 MG/ML IV BOLUS
INTRAVENOUS | Status: DC | PRN
Start: 2021-09-02 — End: 2021-09-02
  Administered 2021-09-02 (×2): 20 mg via INTRAVENOUS

## 2021-09-02 MED ORDER — ORAL CARE MOUTH RINSE: 15.0000 mL | Freq: Once | OROMUCOSAL | Status: DC

## 2021-09-02 MED ORDER — TRANEXAMIC ACID-NACL 1000-0.7 MG/100ML-% IV SOLN
1000.0000 mg | Freq: Once | INTRAVENOUS | Status: AC
  Administered 2021-09-02: 1000 mg via INTRAVENOUS
  Filled 2021-09-02: qty 100

## 2021-09-02 MED ORDER — ONDANSETRON HCL 4 MG/2ML IJ SOLN: 4.0000 mg | Freq: Four times a day (QID) | INTRAMUSCULAR | Status: DC | PRN

## 2021-09-02 MED ORDER — ADULT MULTIVITAMIN W/MINERALS CH
1.0000 | ORAL_TABLET | Freq: Every day | ORAL | Status: DC
  Administered 2021-09-02 – 2021-09-04 (×3): 1 via ORAL
  Filled 2021-09-02 (×3): qty 1

## 2021-09-02 MED ORDER — METOCLOPRAMIDE HCL 5 MG/ML IJ SOLN: 5.0000 mg | Freq: Three times a day (TID) | INTRAMUSCULAR | Status: DC | PRN

## 2021-09-02 MED ORDER — OXYCODONE HCL 5 MG PO TABS
5.0000 mg | ORAL_TABLET | ORAL | Status: DC | PRN
  Administered 2021-09-02 (×2): 5 mg via ORAL
  Administered 2021-09-03: 10 mg via ORAL
  Administered 2021-09-03: 5 mg via ORAL
  Administered 2021-09-03 (×3): 10 mg via ORAL
  Administered 2021-09-04 (×2): 5 mg via ORAL
  Filled 2021-09-02 (×2): qty 1
  Filled 2021-09-02 (×2): qty 2
  Filled 2021-09-02 (×2): qty 1
  Filled 2021-09-02: qty 2
  Filled 2021-09-02: qty 1
  Filled 2021-09-02: qty 2

## 2021-09-02 MED ORDER — PHENYLEPHRINE HCL-NACL 20-0.9 MG/250ML-% IV SOLN
INTRAVENOUS | Status: DC | PRN
  Administered 2021-09-02: 25 ug/min via INTRAVENOUS

## 2021-09-02 MED ORDER — ACETAMINOPHEN 10 MG/ML IV SOLN
INTRAVENOUS | Status: AC
  Filled 2021-09-02: qty 100

## 2021-09-02 MED ORDER — SODIUM CHLORIDE (PF) 0.9 % IJ SOLN
INTRAMUSCULAR | Status: AC
  Filled 2021-09-02: qty 30

## 2021-09-02 MED ORDER — ROPIVACAINE HCL 5 MG/ML IJ SOLN
INTRAMUSCULAR | Status: DC | PRN
  Administered 2021-09-02: 30 mL via PERINEURAL

## 2021-09-02 MED ORDER — ZINC SULFATE 220 (50 ZN) MG PO CAPS
220.0000 mg | ORAL_CAPSULE | Freq: Two times a day (BID) | ORAL | Status: DC
Start: 2021-09-02 — End: 2021-09-04
  Administered 2021-09-02 – 2021-09-04 (×4): 220 mg via ORAL
  Filled 2021-09-02 (×4): qty 1

## 2021-09-02 MED ORDER — ASPIRIN 81 MG PO CHEW
81.0000 mg | CHEWABLE_TABLET | Freq: Two times a day (BID) | ORAL | Status: DC
  Administered 2021-09-02 – 2021-09-04 (×4): 81 mg via ORAL
  Filled 2021-09-02 (×4): qty 1

## 2021-09-02 MED ORDER — ACETAMINOPHEN 10 MG/ML IV SOLN
INTRAVENOUS | Status: DC | PRN
  Administered 2021-09-02: 1000 mg via INTRAVENOUS

## 2021-09-02 MED ORDER — ASPIRIN 81 MG PO CHEW: 81.0000 mg | CHEWABLE_TABLET | Freq: Two times a day (BID) | ORAL | 0 refills | Status: AC

## 2021-09-02 MED ORDER — METHOCARBAMOL 500 MG PO TABS
500.0000 mg | ORAL_TABLET | Freq: Four times a day (QID) | ORAL | Status: DC | PRN
  Administered 2021-09-02 – 2021-09-03 (×3): 500 mg via ORAL
  Filled 2021-09-02 (×3): qty 1

## 2021-09-02 MED ORDER — TRANEXAMIC ACID-NACL 1000-0.7 MG/100ML-% IV SOLN
1000.0000 mg | INTRAVENOUS | Status: AC
  Administered 2021-09-02: 1000 mg via INTRAVENOUS
  Filled 2021-09-02: qty 100

## 2021-09-02 MED ORDER — OMEGA-3-ACID ETHYL ESTERS 1 G PO CAPS
1.0000 g | ORAL_CAPSULE | Freq: Two times a day (BID) | ORAL | Status: DC
  Administered 2021-09-02 – 2021-09-04 (×4): 1 g via ORAL
  Filled 2021-09-02 (×4): qty 1

## 2021-09-02 MED ORDER — 0.9 % SODIUM CHLORIDE (POUR BTL) OPTIME
TOPICAL | Status: DC | PRN
  Administered 2021-09-02: 1000 mL

## 2021-09-02 MED ORDER — SODIUM CHLORIDE (PF) 0.9 % IJ SOLN
INTRAMUSCULAR | Status: DC | PRN
  Administered 2021-09-02: 30 mL

## 2021-09-02 MED ORDER — ALBUTEROL SULFATE HFA 108 (90 BASE) MCG/ACT IN AERS: 2.0000 | INHALATION_SPRAY | Freq: Four times a day (QID) | RESPIRATORY_TRACT | Status: DC | PRN

## 2021-09-02 MED ORDER — BUTALBITAL-APAP-CAFFEINE 50-325-40 MG PO TABS: 1.0000 | ORAL_TABLET | ORAL | Status: DC | PRN

## 2021-09-02 MED ORDER — RISAQUAD PO CAPS
ORAL_CAPSULE | Freq: Every day | ORAL | Status: DC
Start: 2021-09-03 — End: 2021-09-04
  Administered 2021-09-03 – 2021-09-04 (×2): 1 via ORAL
  Filled 2021-09-02 (×2): qty 1

## 2021-09-02 MED ORDER — FENTANYL CITRATE (PF) 250 MCG/5ML IJ SOLN
INTRAMUSCULAR | Status: DC | PRN
  Administered 2021-09-02: 50 ug via INTRAVENOUS

## 2021-09-02 MED ORDER — METHOCARBAMOL 1000 MG/10ML IJ SOLN
500.0000 mg | Freq: Four times a day (QID) | INTRAVENOUS | Status: DC | PRN
  Filled 2021-09-02: qty 5

## 2021-09-02 MED ORDER — FERROUS SULFATE 325 (65 FE) MG PO TABS
325.0000 mg | ORAL_TABLET | Freq: Three times a day (TID) | ORAL | Status: DC
  Administered 2021-09-03 – 2021-09-04 (×4): 325 mg via ORAL
  Filled 2021-09-02 (×4): qty 1

## 2021-09-02 MED ORDER — PROPOFOL 10 MG/ML IV BOLUS
INTRAVENOUS | Status: AC
  Filled 2021-09-02: qty 20

## 2021-09-02 MED ORDER — VANCOMYCIN HCL IN DEXTROSE 1-5 GM/200ML-% IV SOLN
1000.0000 mg | Freq: Two times a day (BID) | INTRAVENOUS | Status: AC
Start: 2021-09-02 — End: 2021-09-02
  Administered 2021-09-02: 1000 mg via INTRAVENOUS
  Filled 2021-09-02: qty 200

## 2021-09-02 MED ORDER — BUPIVACAINE LIPOSOME 1.3 % IJ SUSP
INTRAMUSCULAR | Status: AC
  Filled 2021-09-02: qty 20

## 2021-09-02 MED ORDER — HYDROMORPHONE HCL 1 MG/ML IJ SOLN: 0.2500 mg | INTRAMUSCULAR | Status: DC | PRN

## 2021-09-02 MED ORDER — ONDANSETRON HCL 4 MG PO TABS: 4.0000 mg | ORAL_TABLET | Freq: Three times a day (TID) | ORAL | 1 refills | Status: DC | PRN

## 2021-09-02 MED ORDER — ONDANSETRON HCL 4 MG/2ML IJ SOLN
INTRAMUSCULAR | Status: DC | PRN
  Administered 2021-09-02: 4 mg via INTRAVENOUS

## 2021-09-02 MED ORDER — OXYCODONE-ACETAMINOPHEN 5-325 MG PO TABS: 1.0000 | ORAL_TABLET | ORAL | 0 refills | Status: DC | PRN

## 2021-09-02 MED ORDER — MIDAZOLAM HCL 2 MG/2ML IJ SOLN
INTRAMUSCULAR | Status: DC | PRN
Start: 2021-09-02 — End: 2021-09-02
  Administered 2021-09-02 (×2): 1 mg via INTRAVENOUS

## 2021-09-02 MED ORDER — METOCLOPRAMIDE HCL 5 MG PO TABS
5.0000 mg | ORAL_TABLET | Freq: Three times a day (TID) | ORAL | Status: DC | PRN
Start: 2021-09-02 — End: 2021-09-04

## 2021-09-02 MED ORDER — DROPERIDOL 2.5 MG/ML IJ SOLN: 0.6250 mg | Freq: Once | INTRAMUSCULAR | Status: DC | PRN

## 2021-09-02 MED ORDER — POLYETHYLENE GLYCOL 3350 17 G PO PACK: 17.0000 g | PACK | Freq: Every day | ORAL | Status: DC | PRN

## 2021-09-02 MED ORDER — LACTATED RINGERS IV SOLN: INTRAVENOUS | Status: DC

## 2021-09-02 MED ORDER — SODIUM CHLORIDE 0.9 % IR SOLN
Status: DC | PRN
  Administered 2021-09-02: 1000 mL

## 2021-09-02 MED ORDER — LIDOCAINE 2% (20 MG/ML) 5 ML SYRINGE
INTRAMUSCULAR | Status: DC | PRN
  Administered 2021-09-02: 40 mg via INTRAVENOUS
  Administered 2021-09-02: 20 mg via INTRAVENOUS

## 2021-09-02 MED ORDER — HYDROMORPHONE HCL 1 MG/ML IJ SOLN: 0.5000 mg | INTRAMUSCULAR | Status: DC | PRN

## 2021-09-02 MED ORDER — DOCUSATE SODIUM 100 MG PO CAPS
100.0000 mg | ORAL_CAPSULE | Freq: Two times a day (BID) | ORAL | Status: DC
  Administered 2021-09-02 – 2021-09-04 (×4): 100 mg via ORAL
  Filled 2021-09-02 (×4): qty 1

## 2021-09-02 MED ORDER — ALBUTEROL SULFATE HFA 108 (90 BASE) MCG/ACT IN AERS
INHALATION_SPRAY | RESPIRATORY_TRACT | Status: AC
  Filled 2021-09-02: qty 6.7

## 2021-09-02 MED ORDER — GLUCOSAMINE 500 MG PO CAPS: 1500.0000 mg | ORAL_CAPSULE | Freq: Two times a day (BID) | ORAL | Status: DC

## 2021-09-02 MED ORDER — POVIDONE-IODINE 10 % EX SWAB
2.0000 "application " | Freq: Once | CUTANEOUS | Status: AC
  Administered 2021-09-02: 2 via TOPICAL

## 2021-09-02 MED ORDER — BUPIVACAINE-EPINEPHRINE 0.25% -1:200000 IJ SOLN
INTRAMUSCULAR | Status: DC | PRN
  Administered 2021-09-02: 30 mL

## 2021-09-02 SURGICAL SUPPLY — 59 items
ATTUNE PS FEM RT SZ 4 CEM KNEE (Femur) ×1 IMPLANT
ATTUNE PSRP INSR SZ4 6 KNEE (Insert) ×1 IMPLANT
BAG COUNTER SPONGE SURGICOUNT (BAG) ×1 IMPLANT
BAG SPEC THK2 15X12 ZIP CLS (MISCELLANEOUS)
BAG SPNG CNTER NS LX DISP (BAG) ×1
BAG ZIPLOCK 12X15 (MISCELLANEOUS) IMPLANT
BASE TIBIAL ROT PLAT SZ 5 KNEE (Knees) IMPLANT
BIT DRILL 1.6MX128 (BIT) ×1 IMPLANT
BLADE SAG 18X100X1.27 (BLADE) ×2 IMPLANT
BLADE SAW SGTL 13X75X1.27 (BLADE) ×2 IMPLANT
BNDG CMPR MED 10X6 ELC LF (GAUZE/BANDAGES/DRESSINGS) ×1
BNDG ELASTIC 6X10 VLCR STRL LF (GAUZE/BANDAGES/DRESSINGS) ×2 IMPLANT
BNDG GAUZE ELAST 4 BULKY (GAUZE/BANDAGES/DRESSINGS) ×2 IMPLANT
BOWL SMART MIX CTS (DISPOSABLE) ×2 IMPLANT
BSPLAT TIB 5 CMNT ROT PLAT STR (Knees) ×1 IMPLANT
CEMENT HV SMART SET (Cement) ×4 IMPLANT
COVER SURGICAL LIGHT HANDLE (MISCELLANEOUS) ×2 IMPLANT
CUFF TOURN SGL QUICK 34 (TOURNIQUET CUFF) ×2
CUFF TRNQT CYL 34X4.125X (TOURNIQUET CUFF) ×1 IMPLANT
DRAPE INCISE IOBAN 66X45 STRL (DRAPES) ×2 IMPLANT
DRAPE SHEET LG 3/4 BI-LAMINATE (DRAPES) ×2 IMPLANT
DRAPE U-SHAPE 47X51 STRL (DRAPES) ×2 IMPLANT
DRSG ADAPTIC 3X8 NADH LF (GAUZE/BANDAGES/DRESSINGS) ×2 IMPLANT
DRSG EMULSION OIL 3X16 NADH (GAUZE/BANDAGES/DRESSINGS) ×1 IMPLANT
DRSG PAD ABDOMINAL 8X10 ST (GAUZE/BANDAGES/DRESSINGS) ×2 IMPLANT
DURAPREP 26ML APPLICATOR (WOUND CARE) ×2 IMPLANT
ELECT REM PT RETURN 15FT ADLT (MISCELLANEOUS) ×2 IMPLANT
GAUZE SPONGE 4X4 12PLY STRL (GAUZE/BANDAGES/DRESSINGS) ×2 IMPLANT
GLOVE SURG ORTHO LTX SZ7.5 (GLOVE) ×2 IMPLANT
GLOVE SURG ORTHO LTX SZ8.5 (GLOVE) ×2 IMPLANT
GLOVE SURG UNDER POLY LF SZ7.5 (GLOVE) ×2 IMPLANT
GLOVE SURG UNDER POLY LF SZ8.5 (GLOVE) ×2 IMPLANT
GOWN STRL REUS W/TWL XL LVL3 (GOWN DISPOSABLE) ×4 IMPLANT
HANDPIECE INTERPULSE COAX TIP (DISPOSABLE) ×2
HOLDER FOLEY CATH W/STRAP (MISCELLANEOUS) ×1 IMPLANT
IMMOBILIZER KNEE 20 (SOFTGOODS) ×2
IMMOBILIZER KNEE 20 THIGH 36 (SOFTGOODS) IMPLANT
MANIFOLD NEPTUNE II (INSTRUMENTS) ×2 IMPLANT
NS IRRIG 1000ML POUR BTL (IV SOLUTION) ×2 IMPLANT
PACK TOTAL KNEE CUSTOM (KITS) ×2 IMPLANT
PADDING CAST COTTON 6X4 STRL (CAST SUPPLIES) ×1 IMPLANT
PATELLA MEDIAL ATTUN 35MM KNEE (Knees) ×1 IMPLANT
PIN DRILL FIX HALF THREAD (BIT) ×1 IMPLANT
PIN STEINMAN FIXATION KNEE (PIN) ×1 IMPLANT
PROTECTOR NERVE ULNAR (MISCELLANEOUS) ×2 IMPLANT
SET HNDPC FAN SPRY TIP SCT (DISPOSABLE) ×1 IMPLANT
SPONGE T-LAP 18X18 ~~LOC~~+RFID (SPONGE) ×2 IMPLANT
STAPLER VISISTAT 35W (STAPLE) IMPLANT
STRIP CLOSURE SKIN 1/2X4 (GAUZE/BANDAGES/DRESSINGS) ×4 IMPLANT
SUT MNCRL AB 3-0 PS2 18 (SUTURE) ×2 IMPLANT
SUT VIC AB 0 CT1 36 (SUTURE) ×2 IMPLANT
SUT VIC AB 1 CT1 36 (SUTURE) ×6 IMPLANT
SUT VIC AB 2-0 CT1 27 (SUTURE) ×2
SUT VIC AB 2-0 CT1 TAPERPNT 27 (SUTURE) ×1 IMPLANT
TIBIAL BASE ROT PLAT SZ 5 KNEE (Knees) ×2 IMPLANT
TRAY FOLEY MTR SLVR 14FR STAT (SET/KITS/TRAYS/PACK) ×1 IMPLANT
TRAY FOLEY MTR SLVR 16FR STAT (SET/KITS/TRAYS/PACK) ×1 IMPLANT
WATER STERILE IRR 1000ML POUR (IV SOLUTION) ×4 IMPLANT
WRAP KNEE MAXI GEL POST OP (GAUZE/BANDAGES/DRESSINGS) ×1 IMPLANT

## 2021-09-02 NOTE — Op Note (Signed)
NAME: Kristen [name], Kristen [name] MEDICAL RECORD NO: [id number] ACCOUNT NO: [id number] DATE OF BIRTH: 1951/10/13 FACILITY: [facility] LOCATION: [facility] PHYSICIAN: Doran Heater. Veverly Fells, MD  Operative Report   DATE OF PROCEDURE: 09/02/2021  PREOPERATIVE DIAGNOSIS:  Right knee end-stage arthritis.  POSTOPERATIVE DIAGNOSIS:  Right knee end-stage arthritis.  PROCEDURE PERFORMED:  Right total knee replacement using DePuy Attune prosthesis.  ATTENDING SURGEON:  Esmond Plants, MD  ASSISTANT:  Otilio Connors, PA-C, who was scrubbed during the entire procedure and necessary for satisfactory completion of surgery.  ANESTHESIA:  Spinal anesthesia plus adductor canal block was used.  ESTIMATED BLOOD LOSS:  Minimal.  FLUID REPLACEMENT:  1500 mL crystalloid.  Instrument counts were correct.  No complications.  Perioperative antibiotics were given.  INDICATIONS:  The patient is a 70 year old female with worsening right knee pain secondary to bone-on-bone end-stage arthritis.  The patient has had progressive pain despite conservative management, presents for operative total knee arthroplasty to  relieve pain and restore function.  Informed consent obtained.  DESCRIPTION OF PROCEDURE:  After an adequate level of spinal anesthesia was achieved, the patient was positioned supine on the operating room table.  A nonsterile tourniquet placed on the right proximal thigh.  Right leg prepped and draped in the usual  manner.  Timeout called, verifying correct patient, correct site.  We elevated the leg and exsanguinated with an Esmarch bandage.  We then placed the knee in flexion.  Performed longitudinal midline incision with a 10 blade scalpel.  Dissection down  through subcutaneous tissues sharply with a knife.  We then used a fresh 10 blade for the medial parapatellar arthrotomy.  We then divided the lateral patellofemoral ligaments, everting the patella and exposing the distal femur, which was devoid of  cartilage.  We  entered the distal femur with a step cut drill.  We then irrigated the canal and suctioned and then placed our intramedullary guide, resecting 10 mm off the distal femur set on 5 degrees right as the patient had a flexion contracture. We  then sized our femur to a size 4 anterior down, performed  anterior, posterior and chamfer cuts with a 4-in-1 block.  We then removed ACL and PCL meniscal tissue, subluxing the tibia anteriorly and then performing a perpendicular tibial cut with minimal  posterior slope for this posterior cruciate substituting prosthesis.  We resected 2 mm off the affected medial side.  Once we had our tibial cut done, we removed excess osteophytes off the posterior femoral condyle with a lamina spreader.  We then  injected the posterior capsule with a combination of Marcaine, Exparel and saline.  We then checked our gaps, which were symmetric at 5 mm, both in flexion and extension.  We then completed our tibial preparation with a modular drill and keel punch for  the size 5 tibia.  We externally rotated the component as much as possible, relatively internally rotated the tibial tubercle for patellar tracking.  Next, we performed our box cut with the box cut guide for the 4 femur.  We then put our trial size 4  femur in place and drilled our lug holes.  We then reduced the knee with a 5 mm poly trial, placed the knee in extension.  We had good flexion and extension stability.  We then resurfaced the patella going from 23 mm thickness and resected 7.5 mm using  the shim and the patellar cutting guide.  We then drilled lug holes for the 35 patellar button.  We placed our 35  button in place.  Total thickness of the component and the cut bone was 25 mm of thickness.  We reduced the patella and then ranged the knee  and had excellent patellar tracking with no-touch technique.  We removed all trial components, irrigated thoroughly and then dried the bone.  We then vacuum mixed high viscosity  cement on the back table and cemented the components into place, all in one  step with the tibia, femur, and the patella.  We placed the 5 mm trial poly in the knee and placed the knee in extension while the cement hardened.  We placed a patellar clamp on the patella while the cement hardened.  Once the cement was hardened, we  removed excess cement with quarter inch curved osteotome.  We did a final inspection of the back of the knee.  At this point, we selected the 6 mm size 4 polyethylene and placed that on the tibia, reducing the knee and had excellent stability in flexion  and extension with perfect patellar tracking.  We irrigated thoroughly.  We then injected Exparel, Marcaine and saline into the anterior capsule area, in the suprapatellar pouch medially and laterally.  Next, we repaired our medial parapatellar  arthrotomy with #1 Vicryl suture, followed by 2-0 Vicryl for subcutaneous closure and 4-0 running Monocryl for skin.  Steri-Strips were applied followed by sterile dressing.  The patient tolerated surgery well.   SHW D: 09/02/2021 9:40:55 am T: 09/02/2021 10:23:00 am  JOB: [id number]/ [id number]

## 2021-09-02 NOTE — Transfer of Care (Signed)
Immediate Anesthesia Transfer of Care Note  Patient: ALEECIA TAPIA  Procedure(s) Performed: TOTAL KNEE ARTHROPLASTY (Right: Knee)  Patient Location: PACU  Anesthesia Type:Spinal  Level of Consciousness: awake and alert   Airway & Oxygen Therapy: Patient Spontanous Breathing and Patient connected to face mask oxygen  Post-op Assessment: Report given to RN and Post -op Vital signs reviewed and stable  Post vital signs: Reviewed and stable  Last Vitals:  Vitals Value Taken Time  BP 105/66 09/02/21 0942  Temp    Pulse 66 09/02/21 0943  Resp 19 09/02/21 0943  SpO2 100 % 09/02/21 0943  Vitals shown include unvalidated device data.  Last Pain:  Vitals:   09/02/21 0621  TempSrc:   PainSc: 0-No pain      Patients Stated Pain Goal: 4 (40/34/74 2595)  Complications: No notable events documented.

## 2021-09-02 NOTE — Progress Notes (Signed)
Orthopedic Tech Progress Note Patient Details:  Kristen [name] [birth year] [id number] 0-60 CPM was applied to patient's right knee in PACU. Ortho Devices Type of Ortho Device: Bone foam zero knee Ortho Device/Splint Location: Right knee Ortho Device/Splint Interventions: Ordered   Post Interventions Patient Tolerated: Well  Linus Salmons Tarea Skillman 09/02/2021, 10:44 AM

## 2021-09-02 NOTE — Discharge Instructions (Signed)
Ice to the knee constantly.  Keep the incision covered and clean and dry for one week, then ok to get it wet in the shower.  Do exercise as instructed every hour, please to prevent stiffness.    DO NOT prop anything under the knee, it will make your knee stiff.  Prop under the ankle to encourage your knee to go straight.   Use the walker while you are up and around for balance.  Wear your support stockings 24/7 to prevent blood clots and take baby aspirin twice daily for 30 days also to prevent blood clots  Follow up with Dr Veverly Fells in two weeks in the office, call [phone] for appt.  Please call Dr Veverly Fells (cell) at [phone] for any questions or concerns  INSTRUCTIONS AFTER JOINT REPLACEMENT   Remove items at home which could result in a fall. This includes throw rugs or furniture in walking pathways ICE to the affected joint every three hours while awake for 30 minutes at a time, for at least the first 3-5 days, and then as needed for pain and swelling.  Continue to use ice for pain and swelling. You may notice swelling that will progress down to the foot and ankle.  This is normal after surgery.  Elevate your leg when you are not up walking on it.   Continue to use the breathing machine you got in the hospital (incentive spirometer) which will help keep your temperature down.  It is common for your temperature to cycle up and down following surgery, especially at night when you are not up moving around and exerting yourself.  The breathing machine keeps your lungs expanded and your temperature down.   DIET:  As you were doing prior to hospitalization, we recommend a well-balanced diet.  DRESSING / WOUND CARE / SHOWERING  You may change your dressing 3-5 days after surgery.  Then change the dressing every day with sterile gauze.  Please use good hand washing techniques before changing the dressing.  Do not use any lotions or creams on the incision until instructed by your  surgeon.  ACTIVITY  Increase activity slowly as tolerated, but follow the weight bearing instructions below.   No driving for 6 weeks or until further direction given by your physician.  You cannot drive while taking narcotics.  No lifting or carrying greater than 10 lbs. until further directed by your surgeon. Avoid periods of inactivity such as sitting longer than an hour when not asleep. This helps prevent blood clots.  You may return to work once you are authorized by your doctor.     WEIGHT BEARING   Weight bearing as tolerated with assist device (walker, cane, etc) as directed, use it as long as suggested by your surgeon or therapist, typically at least 4-6 weeks.   EXERCISES  Results after joint replacement surgery are often greatly improved when you follow the exercise, range of motion and muscle strengthening exercises prescribed by your doctor. Safety measures are also important to protect the joint from further injury. Any time any of these exercises cause you to have increased pain or swelling, decrease what you are doing until you are comfortable again and then slowly increase them. If you have problems or questions, call your caregiver or physical therapist for advice.   Rehabilitation is important following a joint replacement. After just a few days of immobilization, the muscles of the leg can become weakened and shrink (atrophy).  These exercises are designed to build  up the tone and strength of the thigh and leg muscles and to improve motion. Often times heat used for twenty to thirty minutes before working out will loosen up your tissues and help with improving the range of motion but do not use heat for the first two weeks following surgery (sometimes heat can increase post-operative swelling).   These exercises can be done on a training (exercise) mat, on the floor, on a table or on a bed. Use whatever works the best and is most comfortable for you.    Use music or  television while you are exercising so that the exercises are a pleasant break in your day. This will make your life better with the exercises acting as a break in your routine that you can look forward to.   Perform all exercises about fifteen times, three times per day or as directed.  You should exercise both the operative leg and the other leg as well.  Exercises include:   Quad Sets - Tighten up the muscle on the front of the thigh (Quad) and hold for 5-10 seconds.   Straight Leg Raises - With your knee straight (if you were given a brace, keep it on), lift the leg to 60 degrees, hold for 3 seconds, and slowly lower the leg.  Perform this exercise against resistance later as your leg gets stronger.  Leg Slides: Lying on your back, slowly slide your foot toward your buttocks, bending your knee up off the floor (only go as far as is comfortable). Then slowly slide your foot back down until your leg is flat on the floor again.  Angel Wings: Lying on your back spread your legs to the side as far apart as you can without causing discomfort.  Hamstring Strength:  Lying on your back, push your heel against the floor with your leg straight by tightening up the muscles of your buttocks.  Repeat, but this time bend your knee to a comfortable angle, and push your heel against the floor.  You may put a pillow under the heel to make it more comfortable if necessary.   A rehabilitation program following joint replacement surgery can speed recovery and prevent re-injury in the future due to weakened muscles. Contact your doctor or a physical therapist for more information on knee rehabilitation.    CONSTIPATION  Constipation is defined medically as fewer than three stools per week and severe constipation as less than one stool per week.  Even if you have a regular bowel pattern at home, your normal regimen is likely to be disrupted due to multiple reasons following surgery.  Combination of anesthesia,  postoperative narcotics, change in appetite and fluid intake all can affect your bowels.   YOU MUST use at least one of the following options; they are listed in order of increasing strength to get the job done.  They are all available over the counter, and you may need to use some, POSSIBLY even all of these options:    Drink plenty of fluids (prune juice may be helpful) and high fiber foods Colace 100 mg by mouth twice a day  Senokot for constipation as directed and as needed Dulcolax (bisacodyl), take with full glass of water  Miralax (polyethylene glycol) once or twice a day as needed.  If you have tried all these things and are unable to have a bowel movement in the first 3-4 days after surgery call either your surgeon or your primary doctor.    If you  experience loose stools or diarrhea, hold the medications until you stool forms back up.  If your symptoms do not get better within 1 week or if they get worse, check with your doctor.  If you experience "the worst abdominal pain ever" or develop nausea or vomiting, please contact the office immediately for further recommendations for treatment.   ITCHING:  If you experience itching with your medications, try taking only a single pain pill, or even half a pain pill at a time.  You can also use Benadryl over the counter for itching or also to help with sleep.   TED HOSE STOCKINGS:  Use stockings on both legs until for at least 2 weeks or as directed by physician office. They may be removed at night for sleeping.  MEDICATIONS:  See your medication summary on the "After Visit Summary" that nursing will review with you.  You may have some home medications which will be placed on hold until you complete the course of blood thinner medication.  It is important for you to complete the blood thinner medication as prescribed.  PRECAUTIONS:  If you experience chest pain or shortness of breath - call 911 immediately for transfer to the hospital emergency  department.   If you develop a fever greater that 101 F, purulent drainage from wound, increased redness or drainage from wound, foul odor from the wound/dressing, or calf pain - CONTACT YOUR SURGEON.                                                   FOLLOW-UP APPOINTMENTS:  If you do not already have a post-op appointment, please call the office for an appointment to be seen by your surgeon.  Guidelines for how soon to be seen are listed in your "After Visit Summary", but are typically between 1-4 weeks after surgery.  OTHER INSTRUCTIONS:   Knee Replacement:  Do not place pillow under knee, focus on keeping the knee straight while resting. CPM instructions: 0-90 degrees, 2 hours in the morning, 2 hours in the afternoon, and 2 hours in the evening. Place foam block, curve side up under heel at all times except when in CPM or when walking.  DO NOT modify, tear, cut, or change the foam block in any way.  POST-OPERATIVE OPIOID TAPER INSTRUCTIONS: It is important to wean off of your opioid medication as soon as possible. If you do not need pain medication after your surgery it is ok to stop day one. Opioids include: Codeine, Hydrocodone(Norco, Vicodin), Oxycodone(Percocet, oxycontin) and hydromorphone amongst others.  Long term and even short term use of opiods can cause: Increased pain response Dependence Constipation Depression Respiratory depression And more.  Withdrawal symptoms can include Flu like symptoms Nausea, vomiting And more Techniques to manage these symptoms Hydrate well Eat regular healthy meals Stay active Use relaxation techniques(deep breathing, meditating, yoga) Do Not substitute Alcohol to help with tapering If you have been on opioids for less than two weeks and do not have pain than it is ok to stop all together.  Plan to wean off of opioids This plan should start within one week post op of your joint replacement. Maintain the same interval or time between taking  each dose and first decrease the dose.  Cut the total daily intake of opioids by one tablet each day Next start to  increase the time between doses. The last dose that should be eliminated is the evening dose.   MAKE SURE YOU:  Understand these instructions.  Get help right away if you are not doing well or get worse.    Thank you for letting us be a part of your medical care team.  It is a privilege we respect greatly.  We hope these instructions will help you stay on track for a fast and full recovery!

## 2021-09-02 NOTE — Brief Op Note (Signed)
09/02/2021  9:29 AM  PATIENT:  Kristen [name]  70 y.o. female  PRE-OPERATIVE DIAGNOSIS:  Right knee end stage osteoarthritis  POST-OPERATIVE DIAGNOSIS:  Right knee end stage osteoarthritis  PROCEDURE:  Procedure(s) with comments: TOTAL KNEE ARTHROPLASTY (Right) - with adductor canal DePuy Attune  SURGEON:  Surgeon(s) and Role:    Netta Cedars, MD - Primary  PHYSICIAN ASSISTANT:   ASSISTANTS: Ventura Bruns, PA-C   ANESTHESIA:   regional and spinal  EBL:  50 mL   BLOOD ADMINISTERED:none  DRAINS: none   LOCAL MEDICATIONS USED:  MARCAINE     SPECIMEN:  No Specimen  DISPOSITION OF SPECIMEN:  N/A  COUNTS:  YES  TOURNIQUET:   Total Tourniquet Time Documented: Thigh (Right) - 90 minutes Total: Thigh (Right) - 90 minutes   DICTATION: .Other Dictation: Dictation Number [id number]  PLAN OF CARE: Admit for overnight observation  PATIENT DISPOSITION:  PACU - hemodynamically stable.   Delay start of Pharmacological VTE agent (>24hrs) due to surgical blood loss or risk of bleeding: no

## 2021-09-02 NOTE — Interval H&P Note (Signed)
History and Physical Interval Note:  09/02/2021 7:22 AM  Kristen [name]  has presented today for surgery, with the diagnosis of Right knee end stage osteoarthritis.  The various methods of treatment have been discussed with the patient and family. After consideration of risks, benefits and other options for treatment, the patient has consented to  Procedure(s) with comments: TOTAL KNEE ARTHROPLASTY (Right) - with adductor canal as a surgical intervention.  The patient's history has been reviewed, patient examined, no change in status, stable for surgery.  I have reviewed the patient's chart and labs.  Questions were answered to the patient's satisfaction.     Augustin Schooling

## 2021-09-02 NOTE — Anesthesia Procedure Notes (Signed)
Date/Time: 09/02/2021 7:29 AM Performed by: Cynda Familia, CRNA Pre-anesthesia Checklist: Patient identified, Emergency Drugs available, Patient being monitored, Suction available and Timeout performed Oxygen Delivery Method: Simple face mask Placement Confirmation: positive ETCO2 and breath sounds checked- equal and bilateral Dental Injury: Teeth and Oropharynx as per pre-operative assessment

## 2021-09-02 NOTE — Anesthesia Procedure Notes (Signed)
Anesthesia Regional Block: Adductor canal block   Pre-Anesthetic Checklist: , timeout performed,  Correct Patient, Correct Site, Correct Laterality,  Correct Procedure, Correct Position, site marked,  Risks and benefits discussed,  Surgical consent,  Pre-op evaluation,  At surgeon's request and post-op pain management  Laterality: Lower and Right  Prep: chloraprep       Needles:  Injection technique: Single-shot  Needle Type: Stimiplex     Needle Length: 9cm  Needle Gauge: 21     Additional Needles:   Procedures:,,,, ultrasound used (permanent image in chart),,    Narrative:  Start time: 09/02/2021 7:05 AM End time: 09/02/2021 7:26 AM Injection made incrementally with aspirations every 5 mL.  Performed by: Personally  Anesthesiologist: Nolon Nations, MD  Additional Notes: BP cuff, EKG monitors applied. Sedation begun. Artery and nerve location verified with ultrasound. Anesthetic injected incrementally (56ml), slowly, and after negative aspirations under direct u/s guidance. Good fascial/perineural spread. Tolerated well.

## 2021-09-02 NOTE — Anesthesia Procedure Notes (Addendum)
Spinal  Patient location during procedure: OR Start time: 09/02/2021 7:30 AM End time: 09/02/2021 7:37 AM Reason for block: surgical anesthesia Staffing Performed: anesthesiologist  Anesthesiologist: Nolon Nations, MD Preanesthetic Checklist Completed: patient identified, IV checked, site marked, risks and benefits discussed, surgical consent, monitors and equipment checked, pre-op evaluation and timeout performed Spinal Block Prep: DuraPrep and site prepped and draped Patient monitoring: heart rate, continuous pulse ox and blood pressure Approach: right paramedian Location: L3-4 Injection technique: single-shot Needle Needle type: Spinocan  Needle gauge: 25 G Needle length: 9 cm Additional Notes Expiration date of kit checked and confirmed. Patient tolerated procedure well, without complications.

## 2021-09-02 NOTE — Anesthesia Procedure Notes (Signed)
Date/Time: 09/02/2021 6:55 AM Performed by: Cynda Familia, CRNA Pre-anesthesia Checklist: Patient identified, Emergency Drugs available, Suction available, Patient being monitored and Timeout performed Oxygen Delivery Method: Nasal cannula Placement Confirmation: positive ETCO2 and breath sounds checked- equal and bilateral Dental Injury: Teeth and Oropharynx as per pre-operative assessment

## 2021-09-02 NOTE — Progress Notes (Signed)
Brief Pharmacy note: home medication  Specialty pharmacy compounded T3/T4 thyroid supplement.  Patient has taken dose 10/21 prior to surgery.  Anticipate discharge 10/22 if working well with PT.  If patient must stay in hospital beyond 10/22, husband can bring in medication.  Pharmacy will follow up 10/22 for discharge plans.  Thank you,  Minda Ditto PharmD [facility] [phone] 09/02/2021, 2:44 PM

## 2021-09-02 NOTE — Plan of Care (Signed)
  Problem: Education: Goal: Knowledge of General Education information will improve Description Including pain rating scale, medication(s)/side effects and non-pharmacologic comfort measures Outcome: Progressing   

## 2021-09-02 NOTE — Progress Notes (Signed)
Handbook has been given to patient.  

## 2021-09-02 NOTE — Care Plan (Signed)
Ortho Bundle Case Management Note  Patient Details  Name: Kristen [name] MRN: [id number] Date of Birth: November 25, 1950                  R TKA on 09-02-21 DCP: Home with husband. 2 story home with 4 ste. DME: No needs. Has a RW and 3-in-1. PT: Quinn Plowman 09-05-21 at 2:30 pm.   DME Arranged:  N/A DME Agency:     HH Arranged:    [place] Agency:     Additional Comments: Please contact me with any questions of if this plan should need to change.  Marianne Sofia, RN,CCM [facility]  [phone] 09/02/2021, 8:47 AM

## 2021-09-02 NOTE — Evaluation (Signed)
Physical Therapy Evaluation Patient Details Name: Kristen [name] MRN: [id number] DOB: 1951-03-09 Today's Date: 09/02/2021  History of Present Illness  70 y.o. female admitted for R TKA on 09/02/21. PMH includes migraines, OA, HOH, shingles.  Clinical Impression  Pt is s/p TKA resulting in the deficits listed below (see PT Problem List). Pt ambulated 88' with RW and R KI 2* knee buckled in standing, distance limited by lightheadedness. Assisted pt to recliner where BP was 92/58, HR 59, SaO2 95% on room air.  Initiated TKA HEP. Good progress expected. Pt will benefit from skilled PT to increase their independence and safety with mobility to allow discharge to the venue listed below.         Recommendations for follow up therapy are one component of a multi-disciplinary discharge planning process, led by the attending physician.  Recommendations may be updated based on patient status, additional functional criteria and insurance authorization.  Follow Up Recommendations Follow surgeon's recommendation for DC plan and follow-up therapies    Equipment Recommendations  None recommended by PT    Recommendations for Other Services       Precautions / Restrictions Precautions Precautions: Fall;Knee Required Braces or Orthoses: Knee Immobilizer - Right Restrictions Weight Bearing Restrictions: No Other Position/Activity Restrictions: WBAT      Mobility  Bed Mobility Overal bed mobility: Modified Independent             General bed mobility comments: HOB up, used rail    Transfers Overall transfer level: Needs assistance Equipment used: Rolling walker (2 wheeled) Transfers: Sit to/from Stand Sit to Stand: Min assist         General transfer comment: R knee buckled in standing so applied KI, VCs hand placement, min A to power up  Ambulation/Gait Ambulation/Gait assistance: Min guard Gait Distance (Feet): 18 Feet Assistive device: Rolling walker (2 wheeled) Gait  Pattern/deviations: Step-to pattern Gait velocity: decr   General Gait Details: VCs sequencing, distance limited by pt feeling lightheaded, assisted her to recliner where BP was 92/58, HR 59, SaO2 95% on room air  Stairs            Wheelchair Mobility    Modified Rankin (Stroke Patients Only)       Balance Overall balance assessment: Needs assistance   Sitting balance-Leahy Scale: Good     Standing balance support: Bilateral upper extremity supported Standing balance-Leahy Scale: Poor Standing balance comment: relies on BUE support                             Pertinent Vitals/Pain Pain Assessment: 0-10 Pain Score: 3  Pain Location: R knee Pain Descriptors / Indicators: Operative site guarding Pain Intervention(s): Limited activity within patient's tolerance;Monitored during session;Premedicated before session;Ice applied    Home Living Family/patient expects to be discharged to:: Private residence Living Arrangements: Spouse/significant other Available Help at Discharge: Family;Available 24 hours/day Type of Home: House Home Access: Stairs to enter Entrance Stairs-Rails: Left Entrance Stairs-Number of Steps: 5 Home Layout: One level Home Equipment: Walker - 2 wheels;Bedside commode;Shower seat;Wheelchair - manual      Prior Function Level of Independence: Independent         Comments: no falls in past 6 months     Hand Dominance        Extremity/Trunk Assessment   Upper Extremity Assessment Upper Extremity Assessment: Overall WFL for tasks assessed    Lower Extremity Assessment Lower Extremity Assessment: RLE deficits/detail RLE Deficits /  Details: 3/5 SLR, knee ext at least 3/5 but R knee buckled in standing so applied KI RLE Sensation: WNL RLE Coordination: WNL    Cervical / Trunk Assessment Cervical / Trunk Assessment: Normal  Communication   Communication: No difficulties  Cognition Arousal/Alertness: Awake/alert Behavior  During Therapy: WFL for tasks assessed/performed Overall Cognitive Status: Within Functional Limits for tasks assessed                                        General Comments      Exercises Total Joint Exercises Ankle Circles/Pumps: AROM;Both;10 reps;Supine Quad Sets: AROM;Both;5 reps;Supine Long Arc Quad: AROM;Right;5 reps;Seated   Assessment/Plan    PT Assessment Patient needs continued PT services  PT Problem List Decreased strength;Decreased range of motion;Decreased activity tolerance;Decreased mobility;Pain;Decreased knowledge of use of DME       PT Treatment Interventions DME instruction;Gait training;Functional mobility training;Therapeutic exercise;Patient/family education;Therapeutic activities    PT Goals (Current goals can be found in the Care Plan section)  Acute Rehab PT Goals Patient Stated Goal: golf PT Goal Formulation: With patient Time For Goal Achievement: 09/09/21 Potential to Achieve Goals: Good    Frequency 7X/week   Barriers to discharge        Co-evaluation               AM-PAC PT "6 Clicks" Mobility  Outcome Measure Help needed turning from your back to your side while in a flat bed without using bedrails?: A Little Help needed moving from lying on your back to sitting on the side of a flat bed without using bedrails?: A Little Help needed moving to and from a bed to a chair (including a wheelchair)?: A Little Help needed standing up from a chair using your arms (e.g., wheelchair or bedside chair)?: A Little Help needed to walk in hospital room?: A Little Help needed climbing 3-5 steps with a railing? : A Lot 6 Click Score: 17    End of Session Equipment Utilized During Treatment: Gait belt;Right knee immobilizer Activity Tolerance: Patient tolerated treatment well Patient left: in chair;with call bell/phone within reach;with chair alarm set Nurse Communication: Mobility status;Other (comment) (pt lightheaded with  walking, use KI when out of bed) PT Visit Diagnosis: Muscle weakness (generalized) (M62.81);Difficulty in walking, not elsewhere classified (R26.2);Pain;Other abnormalities of gait and mobility (R26.89) Pain - Right/Left: Right Pain - part of body: Knee    Time: 3299-2426 PT Time Calculation (min) (ACUTE ONLY): 27 min   Charges:   PT Evaluation $PT Eval Moderate Complexity: 1 Mod PT Treatments $Gait Training: 8-22 mins        Blondell Reveal Kistler PT 09/02/2021  Acute Rehabilitation Services Pager [phone] Office [phone]

## 2021-09-02 NOTE — Anesthesia Postprocedure Evaluation (Signed)
Anesthesia Post Note  Patient: Kristen [name]  Procedure(s) Performed: TOTAL KNEE ARTHROPLASTY (Right: Knee)     Patient location during evaluation: PACU Anesthesia Type: Spinal Level of consciousness: awake and alert Pain management: pain level controlled Vital Signs Assessment: post-procedure vital signs reviewed and stable Respiratory status: spontaneous breathing Cardiovascular status: stable Postop Assessment: spinal receding Anesthetic complications: no   No notable events documented.  Last Vitals:  Vitals:   09/02/21 1334 09/02/21 1500  BP: 128/77 (!) 92/58  Pulse: 67 (!) 59  Resp: 17   Temp: (!) 36.4 C   SpO2: 95% 95%    Last Pain:  Vitals:   09/02/21 1900  TempSrc:   PainSc: [place]

## 2021-09-03 DIAGNOSIS — E039 Hypothyroidism, unspecified: Secondary | ICD-10-CM | POA: Diagnosis not present

## 2021-09-03 DIAGNOSIS — M1711 Unilateral primary osteoarthritis, right knee: Secondary | ICD-10-CM | POA: Diagnosis not present

## 2021-09-03 DIAGNOSIS — Z79899 Other long term (current) drug therapy: Secondary | ICD-10-CM | POA: Diagnosis not present

## 2021-09-03 LAB — CBC
HCT: 33.8 % — ABNORMAL LOW (ref 36.0–46.0)
Hemoglobin: 12.3 g/dL (ref 12.0–15.0)
MCH: 33.1 pg (ref 26.0–34.0)
MCHC: 36.4 g/dL — ABNORMAL HIGH (ref 30.0–36.0)
MCV: 90.9 fL (ref 80.0–100.0)
Platelets: 157 10*3/uL (ref 150–400)
RBC: 3.72 MIL/uL — ABNORMAL LOW (ref 3.87–5.11)
RDW: 11.6 % (ref 11.5–15.5)
WBC: 13.4 10*3/uL — ABNORMAL HIGH (ref 4.0–10.5)
nRBC: 0 % (ref 0.0–0.2)

## 2021-09-03 LAB — BASIC METABOLIC PANEL
Anion gap: 7 (ref 5–15)
BUN: 18 mg/dL (ref 8–23)
CO2: 26 mmol/L (ref 22–32)
Calcium: 8.5 mg/dL — ABNORMAL LOW (ref 8.9–10.3)
Chloride: 102 mmol/L (ref 98–111)
Creatinine, Ser: 0.54 mg/dL (ref 0.44–1.00)
GFR, Estimated: >60 mL/min (ref >60)
Glucose, Bld: 142 mg/dL — ABNORMAL HIGH (ref 70–99)
Potassium: 3.3 mmol/L — ABNORMAL LOW (ref 3.5–5.1)
Sodium: 135 mmol/L (ref 135–145)

## 2021-09-03 MED ORDER — POTASSIUM CHLORIDE CRYS ER 20 MEQ PO TBCR
40.0000 meq | EXTENDED_RELEASE_TABLET | Freq: Once | ORAL | Status: AC
  Administered 2021-09-03: 40 meq via ORAL

## 2021-09-03 MED ORDER — POTASSIUM CHLORIDE CRYS ER 20 MEQ PO TBCR
40.0000 meq | EXTENDED_RELEASE_TABLET | Freq: Once | ORAL | Status: DC
  Filled 2021-09-03: qty 2

## 2021-09-03 NOTE — Progress Notes (Signed)
Physical Therapy Treatment Patient Details Name: Kristen [name] MRN: [id number] DOB: [birth year] Today's Date: 09/03/2021   History of Present Illness 70 y.o. female admitted for R TKA on 09/02/21. PMH includes migraines, OA, HOH, shingles.    PT Comments    Pt is progressing well with mobility. She ambulated 150' with RW, no buckling, no loss of balance. Instructed pt in TKA HEP and reviewed no pillow under knee.    Recommendations for follow up therapy are one component of a multi-disciplinary discharge planning process, led by the attending physician.  Recommendations may be updated based on patient status, additional functional criteria and insurance authorization.  Follow Up Recommendations  Follow surgeon's recommendation for DC plan and follow-up therapies     Equipment Recommendations  None recommended by PT    Recommendations for Other Services       Precautions / Restrictions Precautions Precautions: Fall;Knee Required Braces or Orthoses: Knee Immobilizer - Right Restrictions RLE Weight Bearing: Weight bearing as tolerated Other Position/Activity Restrictions: WBAT     Mobility  Bed Mobility Overal bed mobility: Modified Independent             General bed mobility comments: HOB up, used rail    Transfers Overall transfer level: Needs assistance Equipment used: Rolling walker (2 wheeled) Transfers: Sit to/from Stand Sit to Stand: Min guard         General transfer comment: R knee buckled in standing so applied KI, VCs hand placement  Ambulation/Gait Ambulation/Gait assistance: Min guard Gait Distance (Feet): 140 Feet Assistive device: Rolling walker (2 wheeled) Gait Pattern/deviations: Step-to pattern Gait velocity: decr   General Gait Details: VCs sequencing, no buckling of knee, pt denied lightheadedness   Stairs             Wheelchair Mobility    Modified Rankin (Stroke Patients Only)       Balance Overall balance  assessment: Needs assistance   Sitting balance-Leahy Scale: Good     Standing balance support: Bilateral upper extremity supported Standing balance-Leahy Scale: Fair Standing balance comment: relies on BUE support                            Cognition Arousal/Alertness: Awake/alert Behavior During Therapy: WFL for tasks assessed/performed Overall Cognitive Status: Within Functional Limits for tasks assessed                                        Exercises Total Joint Exercises Ankle Circles/Pumps: AROM;Both;10 reps;Supine Quad Sets: AROM;Both;5 reps;Supine Short Arc Quad: AROM;Right;5 reps;Supine Heel Slides: AAROM;Right;5 reps;Supine Hip ABduction/ADduction: AROM;Right;10 reps;Supine Straight Leg Raises: AAROM;Right;5 reps;Supine Long Arc Quad: Right;5 reps;Seated;AAROM Knee Flexion: AAROM;Right;10 reps;Seated Goniometric ROM: 5-50* AAROM R knee    General Comments        Pertinent Vitals/Pain Pain Score: 3  Pain Location: R knee Pain Descriptors / Indicators: Operative site guarding Pain Intervention(s): Limited activity within patient's tolerance;Monitored during session;Premedicated before session;Ice applied    Home Living                      Prior Function            PT Goals (current goals can now be found in the care plan section) Acute Rehab PT Goals Patient Stated Goal: golf PT Goal Formulation: With patient Time For Goal Achievement: 09/09/21 Potential to  Achieve Goals: Good Progress towards PT goals: Progressing toward goals    Frequency    7X/week      PT Plan      Co-evaluation              AM-PAC PT "6 Clicks" Mobility   Outcome Measure  Help needed turning from your back to your side while in a flat bed without using bedrails?: A Little Help needed moving from lying on your back to sitting on the side of a flat bed without using bedrails?: A Little Help needed moving to and from a bed to a  chair (including a wheelchair)?: A Little Help needed standing up from a chair using your arms (e.g., wheelchair or bedside chair)?: A Little Help needed to walk in hospital room?: A Little Help needed climbing 3-5 steps with a railing? : A Lot 6 Click Score: 17    End of Session Equipment Utilized During Treatment: Gait belt Activity Tolerance: Patient tolerated treatment well Patient left: in chair;with call bell/phone within reach;with chair alarm set Nurse Communication: Mobility status;Other (comment) (pt lightheaded with walking, use KI when out of bed) PT Visit Diagnosis: Muscle weakness (generalized) (M62.81);Difficulty in walking, not elsewhere classified (R26.2);Pain;Other abnormalities of gait and mobility (R26.89) Pain - Right/Left: Right Pain - part of body: Knee     Time: 7096-2836 PT Time Calculation (min) (ACUTE ONLY): 46 min  Charges:  $Gait Training: 8-22 mins $Therapeutic Exercise: 8-22 mins $Therapeutic Activity: 8-22 mins                     Blondell Reveal Kistler PT 09/03/2021  Acute Rehabilitation Services Pager [phone] Office [phone]

## 2021-09-03 NOTE — TOC Transition Note (Signed)
Transition of Care [facility]) - CM/SW Discharge Note   Patient Details  Name: Kristen [name] MRN: [id number] Date of Birth: 02-04-1951  [facility]) CM/SW Contact:  Lennart Pall, LCSW Phone Number: 09/03/2021, 10:21 AM   Clinical Narrative:    Met with pt and confirming she has all needed DME at home.  Plan OPPT at [facility]).  No further TOC needs.   Final next level of care: OP Rehab Barriers to Discharge: No Barriers Identified   Patient Goals and CMS Choice Patient states their goals for this hospitalization and ongoing recovery are:: return home      Discharge Placement                       Discharge Plan and Services                DME Arranged: N/A                    Social Determinants of Health (SDOH) Interventions     Readmission Risk Interventions No flowsheet data found.

## 2021-09-03 NOTE — Plan of Care (Signed)
  Problem: Education: Goal: Knowledge of General Education information will improve Description Including pain rating scale, medication(s)/side effects and non-pharmacologic comfort measures Outcome: Progressing   

## 2021-09-03 NOTE — Progress Notes (Signed)
Patient ID: Kristen [name], female   DOB: 12-Nov-1951, 70 y.o.   MRN: [id number] Subjective: 1 Day Post-Op Procedure(s) (LRB): TOTAL KNEE ARTHROPLASTY (Right)    Patient reports pain as moderate.  Making some progress with PT but concerns about safe home discharge related  Objective:   VITALS:   Vitals:   09/03/21 0146 09/03/21 0611  BP: 119/60 111/60  Pulse: 78 72  Resp: 18 16  Temp: 99.4 F (37.4 C) 98.6 F (37 C)  SpO2: 95% 97%    Neurovascular intact Incision: dressing C/D/I  LABS Recent Labs    09/03/21 0338  HGB 12.3  HCT 33.8*  WBC 13.4*  PLT 157    Recent Labs    09/03/21 0338  NA 135  K 3.3*  BUN 18  CREATININE 0.54  GLUCOSE 142*    No results for input(s): LABPT, INR in the last 72 hours.   Assessment/Plan: 1 Day Post-Op Procedure(s) (LRB): TOTAL KNEE ARTHROPLASTY (Right)   Advance diet Up with therapy - will plan on inpatient PT to work towards home discharge likely tomorrow. Due to hypokalemia I will order KDur and recheck in am

## 2021-09-03 NOTE — Progress Notes (Signed)
Physical Therapy Treatment Patient Details Name: Kristen [name] MRN: [id number] DOB: 03-Jul-1951 Today's Date: 09/03/2021   History of Present Illness 70 y.o. female admitted for R TKA on 09/02/21. PMH includes migraines, OA, HOH, shingles.    PT Comments    Pt ambulated 140' with RW, distance limited by R knee pain and fatigue. Stair training completed with pt and spouse.    Recommendations for follow up therapy are one component of a multi-disciplinary discharge planning process, led by the attending physician.  Recommendations may be updated based on patient status, additional functional criteria and insurance authorization.  Follow Up Recommendations  Follow surgeon's recommendation for DC plan and follow-up therapies     Equipment Recommendations  None recommended by PT    Recommendations for Other Services       Precautions / Restrictions Precautions Precautions: Fall;Knee Precaution Booklet Issued: Yes (comment) Precaution Comments: reviewed no pillow under knee Required Braces or Orthoses: Knee Immobilizer - Right Restrictions Weight Bearing Restrictions: No RLE Weight Bearing: Weight bearing as tolerated Other Position/Activity Restrictions: WBAT     Mobility  Bed Mobility Overal bed mobility: Modified Independent             General bed mobility comments: up in recliner    Transfers Overall transfer level: Needs assistance Equipment used: Rolling walker (2 wheeled) Transfers: Sit to/from Stand Sit to Stand: Min guard         General transfer comment: VCs hand placement  Ambulation/Gait Ambulation/Gait assistance: Min guard Gait Distance (Feet): 140 Feet Assistive device: Rolling walker (2 wheeled) Gait Pattern/deviations: Step-to pattern Gait velocity: decr   General Gait Details: VCs sequencing, distance limited by fatigue and pain   Stairs Stairs: Yes Stairs assistance: Min assist Stair Management: One rail Left;Forwards Number  of Stairs: 3 General stair comments: spouse present for stair training, Min A to manage RW, VCs sequencing   Wheelchair Mobility    Modified Rankin (Stroke Patients Only)       Balance Overall balance assessment: Needs assistance   Sitting balance-Leahy Scale: Good     Standing balance support: Bilateral upper extremity supported Standing balance-Leahy Scale: Fair Standing balance comment: relies on BUE support                            Cognition Arousal/Alertness: Awake/alert Behavior During Therapy: WFL for tasks assessed/performed Overall Cognitive Status: Within Functional Limits for tasks assessed                                        Exercises Total Joint Exercises Ankle Circles/Pumps: AROM;Both;10 reps;Supine Quad Sets: AROM;Both;5 reps;Supine Short Arc Quad: AROM;Right;5 reps;Supine Heel Slides: AAROM;Right;10 reps;Supine Hip ABduction/ADduction: AROM;Right;10 reps;Supine Straight Leg Raises: AAROM;Right;5 reps;Supine Long Arc Quad: Right;5 reps;Seated;AAROM Knee Flexion: AAROM;Right;10 reps;Seated Goniometric ROM: 5-50* AAROM R knee    General Comments        Pertinent Vitals/Pain Pain Score: 5  Pain Location: R knee Pain Descriptors / Indicators: Operative site guarding;Sore Pain Intervention(s): Limited activity within patient's tolerance;Monitored during session;Premedicated before session;Ice applied    Home Living                      Prior Function            PT Goals (current goals can now be found in the care plan section) Acute Rehab  PT Goals Patient Stated Goal: golf PT Goal Formulation: With patient/family Time For Goal Achievement: 09/09/21 Potential to Achieve Goals: Good Progress towards PT goals: Progressing toward goals    Frequency    7X/week      PT Plan      Co-evaluation              AM-PAC PT "6 Clicks" Mobility   Outcome Measure  Help needed turning from your back  to your side while in a flat bed without using bedrails?: A Little Help needed moving from lying on your back to sitting on the side of a flat bed without using bedrails?: A Little Help needed moving to and from a bed to a chair (including a wheelchair)?: A Little Help needed standing up from a chair using your arms (e.g., wheelchair or bedside chair)?: A Little Help needed to walk in hospital room?: A Little Help needed climbing 3-5 steps with a railing? : A Lot 6 Click Score: 17    End of Session Equipment Utilized During Treatment: Gait belt Activity Tolerance: Patient tolerated treatment well Patient left: in chair;with call bell/phone within reach;with chair alarm set Nurse Communication: Mobility status PT Visit Diagnosis: Muscle weakness (generalized) (M62.81);Difficulty in walking, not elsewhere classified (R26.2);Pain;Other abnormalities of gait and mobility (R26.89) Pain - Right/Left: Right Pain - part of body: Knee     Time: 4825-0037 PT Time Calculation (min) (ACUTE ONLY): 30 min  Charges:  $Gait Training: 8-22 mins  $Therapeutic Activity: 8-22 mins                     Blondell Reveal Kistler PT 09/03/2021  Acute Rehabilitation Services Pager [phone] Office [phone]

## 2021-09-03 NOTE — Progress Notes (Signed)
Bone foam now put on patient. When attempted to put bone foam on at other times, ortho tech was putting CPM on patient or PT was working with patient.

## 2021-09-04 DIAGNOSIS — M1711 Unilateral primary osteoarthritis, right knee: Secondary | ICD-10-CM | POA: Diagnosis not present

## 2021-09-04 DIAGNOSIS — Z79899 Other long term (current) drug therapy: Secondary | ICD-10-CM | POA: Diagnosis not present

## 2021-09-04 DIAGNOSIS — E039 Hypothyroidism, unspecified: Secondary | ICD-10-CM | POA: Diagnosis not present

## 2021-09-04 HISTORY — PX: REPLACEMENT TOTAL KNEE: SUR1224

## 2021-09-04 LAB — BASIC METABOLIC PANEL
Anion gap: 6 (ref 5–15)
BUN: 16 mg/dL (ref 8–23)
CO2: 27 mmol/L (ref 22–32)
Calcium: 8.5 mg/dL — ABNORMAL LOW (ref 8.9–10.3)
Chloride: 100 mmol/L (ref 98–111)
Creatinine, Ser: 0.58 mg/dL (ref 0.44–1.00)
GFR, Estimated: >60 mL/min (ref >60)
Glucose, Bld: 133 mg/dL — ABNORMAL HIGH (ref 70–99)
Potassium: 3.6 mmol/L (ref 3.5–5.1)
Sodium: 133 mmol/L — ABNORMAL LOW (ref 135–145)

## 2021-09-04 LAB — CBC
HCT: 30.7 % — ABNORMAL LOW (ref 36.0–46.0)
Hemoglobin: 11.1 g/dL — ABNORMAL LOW (ref 12.0–15.0)
MCH: 33.1 pg (ref 26.0–34.0)
MCHC: 36.2 g/dL — ABNORMAL HIGH (ref 30.0–36.0)
MCV: 91.6 fL (ref 80.0–100.0)
Platelets: 124 10*3/uL — ABNORMAL LOW (ref 150–400)
RBC: 3.35 MIL/uL — ABNORMAL LOW (ref 3.87–5.11)
RDW: 11.8 % (ref 11.5–15.5)
WBC: 13 10*3/uL — ABNORMAL HIGH (ref 4.0–10.5)
nRBC: 0 % (ref 0.0–0.2)

## 2021-09-04 NOTE — Progress Notes (Signed)
Physical Therapy Treatment Patient Details Name: Kristen [name] MRN: [id number] DOB: [birth year] Today's Date: 09/04/2021   History of Present Illness 70 y.o. female admitted for R TKA on 09/02/21. PMH includes migraines, OA, HOH, shingles.    PT Comments    Pt continues motivated and progressing well with mobility.  Pt eager for dc home this date.   Recommendations for follow up therapy are one component of a multi-disciplinary discharge planning process, led by the attending physician.  Recommendations may be updated based on patient status, additional functional criteria and insurance authorization.  Follow Up Recommendations  Follow surgeon's recommendation for DC plan and follow-up therapies     Equipment Recommendations  None recommended by PT    Recommendations for Other Services       Precautions / Restrictions Precautions Precautions: Fall;Knee Precaution Comments: reviewed no pillow under knee Required Braces or Orthoses: Knee Immobilizer - Right Restrictions Weight Bearing Restrictions: No RLE Weight Bearing: Weight bearing as tolerated Other Position/Activity Restrictions: WBAT     Mobility  Bed Mobility Overal bed mobility: Modified Independent             General bed mobility comments: using gait belt to assist R LE off bed    Transfers Overall transfer level: Needs assistance Equipment used: Rolling walker (2 wheeled) Transfers: Sit to/from Stand Sit to Stand: Min guard;Supervision         General transfer comment: cues for position from chair for sitting  Ambulation/Gait Ambulation/Gait assistance: Min guard;Supervision Gait Distance (Feet): 150 Feet Assistive device: Rolling walker (2 wheeled) Gait Pattern/deviations: Step-to pattern Gait velocity: decr   General Gait Details: cues for posture and position from [employer]             Wheelchair Mobility    Modified Rankin (Stroke Patients Only)       Balance  Overall balance assessment: Needs assistance Sitting-balance support: Feet supported;No upper extremity supported Sitting balance-Leahy Scale: Good     Standing balance support: No upper extremity supported Standing balance-Leahy Scale: Fair Standing balance comment: washing hands and face at sink                            Cognition Arousal/Alertness: Awake/alert Behavior During Therapy: WFL for tasks assessed/performed Overall Cognitive Status: Within Functional Limits for tasks assessed                                        Exercises Total Joint Exercises Ankle Circles/Pumps: AROM;Both;10 reps;Supine Quad Sets: AROM;Both;Supine;10 reps Heel Slides: AAROM;Right;Supine;15 reps Hip ABduction/ADduction: AROM;Right;10 reps;Supine Straight Leg Raises: AAROM;Right;Supine;10 reps Long Arc Quad: Right;Seated;AAROM;10 reps Goniometric ROM: -5 - 50    General Comments        Pertinent Vitals/Pain Pain Assessment: 0-10 Pain Score: 5  Pain Location: R knee Pain Descriptors / Indicators: Aching;Sore Pain Intervention(s): Limited activity within patient's tolerance;Monitored during session;Premedicated before session;Ice applied    Home Living                      Prior Function            PT Goals (current goals can now be found in the care plan section) Acute Rehab PT Goals Patient Stated Goal: golf PT Goal Formulation: With patient/family Time For Goal Achievement: 09/09/21 Potential to Achieve Goals: Good Progress towards  PT goals: Progressing toward goals    Frequency    7X/week      PT Plan Current plan remains appropriate    Co-evaluation              AM-PAC PT "6 Clicks" Mobility   Outcome Measure  Help needed turning from your back to your side while in a flat bed without using bedrails?: A Little Help needed moving from lying on your back to sitting on the side of a flat bed without using bedrails?: A  Little Help needed moving to and from a bed to a chair (including a wheelchair)?: A Little Help needed standing up from a chair using your arms (e.g., wheelchair or bedside chair)?: A Little Help needed to walk in hospital room?: A Little Help needed climbing 3-5 steps with a railing? : A Little 6 Click Score: 18    End of Session Equipment Utilized During Treatment: Gait belt Activity Tolerance: Patient tolerated treatment well Patient left: in chair;with call bell/phone within reach;with chair alarm set Nurse Communication: Mobility status PT Visit Diagnosis: Muscle weakness (generalized) (M62.81);Difficulty in walking, not elsewhere classified (R26.2);Pain;Other abnormalities of gait and mobility (R26.89) Pain - Right/Left: Right Pain - part of body: Knee     Time: 0912-0950 PT Time Calculation (min) (ACUTE ONLY): 38 min  Charges:  $Gait Training: 8-22 mins $Therapeutic Exercise: 8-22 mins $Therapeutic Activity: 8-22 mins                     Mauro Kaufmann PT Acute Rehabilitation Services Pager [phone] Office [phone]    Nakyia Dau 09/04/2021, 9:54 AM

## 2021-09-04 NOTE — Discharge Summary (Signed)
In most cases prophylactic antibiotics for Dental procdeures after total joint surgery are not necessary.  Exceptions are as follows:  1. History of prior total joint infection  2. Severely immunocompromised (Organ Transplant, cancer chemotherapy, Rheumatoid biologic meds such as Alsen)  3. Poorly controlled diabetes (A1C &gt; 8.0, blood glucose over 200)  If you have one of these conditions, contact your surgeon for an antibiotic prescription, prior to your dental procedure. Orthopedic Discharge Summary        Physician Discharge Summary  Patient ID: Kristen [name] MRN: [id number] DOB/AGE: [birth year] 70 y.o.  Admit date: 09/02/2021 Discharge date: 09/04/2021   Procedures:  Procedure(s) (LRB): TOTAL KNEE ARTHROPLASTY (Right)  Attending Physician:  Dr. Esmond Plants  Admission Diagnoses:   Right knee end stage OA  Discharge Diagnoses:  same   Past Medical History:  Diagnosis Date   Arthritis    In hands   Asthma    seasonal   Hearing aid worn    Bil   History of kidney stones    History of shingles 2007   Shingles shot current   Hypothyroid    Kidney stones, calcium oxalate    Lithrotripsy x1; surgical removal X1  via cystoscopy   Migraine    menopausal   Pneumonia 4-5- yrs ago   "walking pneumonia"    PCP: Midge Minium, MD   Discharged Condition: good  Hospital Course:  Patient underwent the above stated procedure on 09/02/2021. Patient tolerated the procedure well and brought to the recovery room in good condition and subsequently to the floor. Patient had an uncomplicated hospital course and was stable for discharge.   Disposition: Discharge disposition: 01-Home or Self Care      with follow up in 2 weeks    Follow-up Information     Netta Cedars, MD. Go on 09/14/2021.   Specialty: Orthopedic Surgery Why: You are scheduled for first post op appointment on Wednesday November 2nd at 9:30am. Contact information: [address] [place] [address] [phone]         Netta Cedars, MD. Call in 2 week(s).   Specialty: Orthopedic Surgery Why: call [phone] for appt in two weeks Contact information: [address]  [place] [address] [phone]                 Dental Antibiotics:  In most cases prophylactic antibiotics for Dental procdeures after total joint surgery are not necessary.  Exceptions are as follows:  1. History of prior total joint infection  2. Severely immunocompromised (Organ Transplant, cancer chemotherapy, Rheumatoid biologic meds such as [place])  3. Poorly controlled diabetes (A1C &gt; 8.0, blood glucose over 200)  If you have one of these conditions, contact your surgeon for an antibiotic prescription, prior to your dental procedure.  Discharge Instructions     Call MD / Call 911   Complete by: As directed    If you experience chest pain or shortness of breath, CALL 911 and be transported to the hospital emergency room.  If you develope a fever above 101 F, pus (white drainage) or increased drainage or redness at the wound, or calf pain, call your surgeon's office.   Constipation Prevention   Complete by: As directed    Drink plenty of fluids.  Prune juice may be helpful.  You may use a stool softener, such as Colace (over the counter) 100 mg twice a day.  Use MiraLax (over the counter) for constipation as  needed.   Diet - low sodium heart healthy   Complete by: As directed    Increase activity slowly as tolerated   Complete by: As directed    Post-operative opioid taper instructions:   Complete by: As directed    POST-OPERATIVE OPIOID TAPER INSTRUCTIONS: It is important to wean off of your opioid medication as soon as possible. If you do not need pain medication after your surgery it is ok to stop day one. Opioids include: Codeine, Hydrocodone(Norco, Vicodin), Oxycodone(Percocet, oxycontin) and hydromorphone amongst  others.  Long term and even short term use of opiods can cause: Increased pain response Dependence Constipation Depression Respiratory depression And more.  Withdrawal symptoms can include Flu like symptoms Nausea, vomiting And more Techniques to manage these symptoms Hydrate well Eat regular healthy meals Stay active Use relaxation techniques(deep breathing, meditating, yoga) Do Not substitute Alcohol to help with tapering If you have been on opioids for less than two weeks and do not have pain than it is ok to stop all together.  Plan to wean off of opioids This plan should start within one week post op of your joint replacement. Maintain the same interval or time between taking each dose and first decrease the dose.  Cut the total daily intake of opioids by one tablet each day Next start to increase the time between doses. The last dose that should be eliminated is the evening dose.          Allergies as of 09/04/2021       Reactions   Gantrisin [sulfisoxazole]    Urticaria with sun exposure   Penicillins    REACTION: as child; ? hives   Red Yeast Rice [cholestin] Hives   Sulfa Antibiotics    Reaction not definite/ some hives        Medication List     TAKE these medications    albuterol 108 (90 Base) MCG/ACT inhaler Commonly known as: VENTOLIN HFA Inhale 2 puffs into the lungs every 6 (six) hours as needed for wheezing or shortness of breath.   AMBULATORY NON FORMULARY MEDICATION Medication Name: T3/T4 12.5 mcg-25 mcg capsule. Take 1 capsule by mouth every morning   aspirin 81 MG chewable tablet Commonly known as: Aspirin Childrens Chew 1 tablet (81 mg total) by mouth in the morning and at bedtime.   butalbital-aspirin-caffeine 50-325-40 MG capsule Commonly known as: FIORINAL TAKE ONE CAPSULE BY MOUTH EVERY 6 HOURS AS NEEDED FOR HEADACHE What changed:  how much to take how to take this when to take this reasons to take this additional  instructions   CALCIUM CITRATE + PO Take 1 tablet by mouth daily.   Fish Oil 1000 MG Caps Take 1,000 mg by mouth 2 (two) times daily.   GLUCOSAMINE PO Take 1,500 mg by mouth 2 (two) times daily.   indapamide 1.25 MG tablet Commonly known as: LOZOL Take 1.25 mg by mouth daily.   loratadine 10 MG tablet Commonly known as: CLARITIN Take 10 mg by mouth daily as needed for allergies.   Magnesium 80 MG Tabs Take 1 tablet by mouth 2 (two) times daily.   meloxicam 15 MG tablet Commonly known as: MOBIC TAKE 1 TABLET BY MOUTH EVERY DAY   methocarbamol 500 MG tablet Commonly known as: Robaxin Take 1 tablet (500 mg total) by mouth every 8 (eight) hours as needed for muscle spasms.   multivitamin tablet Take 1 tablet by mouth daily. MinRX dietary supplement: 1 by mouth daily   ondansetron 4  MG tablet Commonly known as: Zofran Take 1 tablet (4 mg total) by mouth every 8 (eight) hours as needed for nausea, vomiting or refractory nausea / vomiting.   oxyCODONE-acetaminophen 5-325 MG tablet Commonly known as: Percocet Take 1-2 tablets by mouth every 4 (four) hours as needed for severe pain.   PRESCRIPTION MEDICATION Take 1 tablet by mouth daily. Liothyronnie Sodium / levothyroxine  T-3/ T4   PROBIOTIC DAILY PO Take 1 tablet by mouth daily.   VITAMIN C PO Take 1 tablet by mouth 2 (two) times daily.   ZINC 15 PO Take 10 mg by mouth in the morning and at bedtime.          Signed: Augustin Schooling 09/04/2021, 8:53 AM  [facility] is now [facility] [address], [place], [place], [place] [address] Phone: [place]

## 2021-09-04 NOTE — Plan of Care (Signed)
  Problem: Education: Goal: Knowledge of General Education information will improve Description: Including pain rating scale, medication(s)/side effects and non-pharmacologic comfort measures Outcome: Progressing   Problem: Health Behavior/Discharge Planning: Goal: Ability to manage health-related needs will improve Outcome: Progressing   Problem: Activity: Goal: Risk for activity intolerance will decrease Outcome: Progressing   Problem: Pain Managment: Goal: General experience of comfort will improve Outcome: Progressing   Problem: Pain Management: Goal: Pain level will decrease with appropriate interventions Outcome: Progressing   

## 2021-09-04 NOTE — Progress Notes (Signed)
Orthopedics Progress Note  Subjective: Patient feeling better today and ready to go home  Objective:  Vitals:   09/03/21 2121 09/04/21 0443  BP: (!) 142/67 129/64  Pulse: 76 74  Resp: 16 16  Temp: 99 F (37.2 C) 99.1 F (37.3 C)  SpO2: 98% 95%    General: Awake and alert  Musculoskeletal: Right knee dressing changed, wound looks good Neurovascularly intact  Lab Results  Component Value Date   WBC 13.0 (H) 09/04/2021   HGB 11.1 (L) 09/04/2021   HCT 30.7 (L) 09/04/2021   MCV 91.6 09/04/2021   PLT 124 (L) 09/04/2021       Component Value Date/Time   NA 133 (L) 09/04/2021 0312   K 3.6 09/04/2021 0312   CL 100 09/04/2021 0312   CO2 27 09/04/2021 0312   GLUCOSE 133 (H) 09/04/2021 0312   BUN 16 09/04/2021 0312   CREATININE 0.58 09/04/2021 0312   CALCIUM 8.5 (L) 09/04/2021 0312   GFRNONAA >60 09/04/2021 3736    Lab Results  Component Value Date   INR 0.9 08/09/2021   INR 0.6 (L) 07/25/2021    Assessment/Plan: POD #2 s/p Procedure(s): TOTAL KNEE ARTHROPLASTY Stable after TKR right, ready for D/C home after therapy today Continue DVT prophylaxis with 81mg  ASA and stockings  Doran Heater. Veverly Fells, MD 09/04/2021 8:50 AM

## 2021-09-04 NOTE — Progress Notes (Signed)
The patient is alert and oriented and has been seen by her physician. The orders for discharge were written. IV has been removed. Went over discharge instructions with patient and family. She is about to be discharged via wheelchair with all of her belongings.

## 2021-09-05 DIAGNOSIS — M25561 Pain in right knee: Secondary | ICD-10-CM | POA: Diagnosis not present

## 2021-09-07 ENCOUNTER — Encounter (HOSPITAL_COMMUNITY): Payer: Self-pay | Admitting: Orthopedic Surgery

## 2021-09-07 DIAGNOSIS — M25561 Pain in right knee: Secondary | ICD-10-CM | POA: Diagnosis not present

## 2021-09-09 DIAGNOSIS — M25561 Pain in right knee: Secondary | ICD-10-CM | POA: Diagnosis not present

## 2021-09-10 NOTE — Assessment & Plan Note (Signed)
Chronic problem.  Has been attempting to control w/ diet and exercise.  Last LDL 158 but she is hoping to increase her physical activity after knee surgery.  Check labs and determine if meds are needed

## 2021-09-10 NOTE — Assessment & Plan Note (Signed)
Pt is due for repeat colonoscopy due to presence of polyps but she reports she never got the letter that was mailed.  Referral placed.  Pt aware.

## 2021-09-10 NOTE — Assessment & Plan Note (Signed)
Chronic problem.  On compounded T3/T4.  Currently asymptomatic.  Check labs.  Adjust meds prn

## 2021-09-12 DIAGNOSIS — M25561 Pain in right knee: Secondary | ICD-10-CM | POA: Diagnosis not present

## 2021-09-14 DIAGNOSIS — Z4789 Encounter for other orthopedic aftercare: Secondary | ICD-10-CM | POA: Diagnosis not present

## 2021-09-14 DIAGNOSIS — M25561 Pain in right knee: Secondary | ICD-10-CM | POA: Diagnosis not present

## 2021-09-16 DIAGNOSIS — M25561 Pain in right knee: Secondary | ICD-10-CM | POA: Diagnosis not present

## 2021-09-18 ENCOUNTER — Other Ambulatory Visit: Payer: Self-pay | Admitting: Family

## 2021-09-20 DIAGNOSIS — M25561 Pain in right knee: Secondary | ICD-10-CM | POA: Diagnosis not present

## 2021-09-22 DIAGNOSIS — M25561 Pain in right knee: Secondary | ICD-10-CM | POA: Diagnosis not present

## 2021-09-27 DIAGNOSIS — M25561 Pain in right knee: Secondary | ICD-10-CM | POA: Diagnosis not present

## 2021-09-29 DIAGNOSIS — M25561 Pain in right knee: Secondary | ICD-10-CM | POA: Diagnosis not present

## 2021-10-04 DIAGNOSIS — M25561 Pain in right knee: Secondary | ICD-10-CM | POA: Diagnosis not present

## 2021-10-11 DIAGNOSIS — M25561 Pain in right knee: Secondary | ICD-10-CM | POA: Diagnosis not present

## 2021-10-14 DIAGNOSIS — M25561 Pain in right knee: Secondary | ICD-10-CM | POA: Diagnosis not present

## 2021-10-18 DIAGNOSIS — M25561 Pain in right knee: Secondary | ICD-10-CM | POA: Diagnosis not present

## 2021-10-19 DIAGNOSIS — M24661 Ankylosis, right knee: Secondary | ICD-10-CM | POA: Diagnosis not present

## 2021-10-19 DIAGNOSIS — Z96651 Presence of right artificial knee joint: Secondary | ICD-10-CM | POA: Diagnosis not present

## 2021-10-19 DIAGNOSIS — T8482XA Fibrosis due to internal orthopedic prosthetic devices, implants and grafts, initial encounter: Secondary | ICD-10-CM | POA: Diagnosis not present

## 2021-10-20 DIAGNOSIS — M25561 Pain in right knee: Secondary | ICD-10-CM | POA: Diagnosis not present

## 2021-10-21 DIAGNOSIS — M25561 Pain in right knee: Secondary | ICD-10-CM | POA: Diagnosis not present

## 2021-10-24 DIAGNOSIS — M25561 Pain in right knee: Secondary | ICD-10-CM | POA: Diagnosis not present

## 2021-10-25 DIAGNOSIS — M25561 Pain in right knee: Secondary | ICD-10-CM | POA: Diagnosis not present

## 2021-10-27 DIAGNOSIS — M25561 Pain in right knee: Secondary | ICD-10-CM | POA: Diagnosis not present

## 2021-10-31 DIAGNOSIS — M25561 Pain in right knee: Secondary | ICD-10-CM | POA: Diagnosis not present

## 2021-11-02 DIAGNOSIS — M25561 Pain in right knee: Secondary | ICD-10-CM | POA: Diagnosis not present

## 2021-11-04 DIAGNOSIS — M25561 Pain in right knee: Secondary | ICD-10-CM | POA: Diagnosis not present

## 2021-11-04 DIAGNOSIS — Z4789 Encounter for other orthopedic aftercare: Secondary | ICD-10-CM | POA: Diagnosis not present

## 2021-11-08 DIAGNOSIS — M25561 Pain in right knee: Secondary | ICD-10-CM | POA: Diagnosis not present

## 2021-11-11 DIAGNOSIS — M25561 Pain in right knee: Secondary | ICD-10-CM | POA: Diagnosis not present

## 2021-11-15 DIAGNOSIS — M25561 Pain in right knee: Secondary | ICD-10-CM | POA: Diagnosis not present

## 2021-11-15 DIAGNOSIS — H5203 Hypermetropia, bilateral: Secondary | ICD-10-CM | POA: Diagnosis not present

## 2021-11-15 DIAGNOSIS — H00011 Hordeolum externum right upper eyelid: Secondary | ICD-10-CM | POA: Diagnosis not present

## 2021-11-15 DIAGNOSIS — H52221 Regular astigmatism, right eye: Secondary | ICD-10-CM | POA: Diagnosis not present

## 2021-11-15 DIAGNOSIS — H524 Presbyopia: Secondary | ICD-10-CM | POA: Diagnosis not present

## 2021-11-18 DIAGNOSIS — M25561 Pain in right knee: Secondary | ICD-10-CM | POA: Diagnosis not present

## 2021-11-22 DIAGNOSIS — M25561 Pain in right knee: Secondary | ICD-10-CM | POA: Diagnosis not present

## 2021-11-24 DIAGNOSIS — M25561 Pain in right knee: Secondary | ICD-10-CM | POA: Diagnosis not present

## 2021-11-29 ENCOUNTER — Ambulatory Visit (INDEPENDENT_AMBULATORY_CARE_PROVIDER_SITE_OTHER): Payer: Medicare Other | Admitting: Family Medicine

## 2021-11-29 ENCOUNTER — Encounter: Payer: Self-pay | Admitting: Family Medicine

## 2021-11-29 VITALS — BP 128/80 | HR 75 | Temp 97.9°F | Resp 16 | Wt 143.0 lb

## 2021-11-29 DIAGNOSIS — M1711 Unilateral primary osteoarthritis, right knee: Secondary | ICD-10-CM

## 2021-11-29 DIAGNOSIS — M25561 Pain in right knee: Secondary | ICD-10-CM | POA: Diagnosis not present

## 2021-11-29 DIAGNOSIS — E785 Hyperlipidemia, unspecified: Secondary | ICD-10-CM

## 2021-11-29 DIAGNOSIS — D126 Benign neoplasm of colon, unspecified: Secondary | ICD-10-CM

## 2021-11-29 DIAGNOSIS — E663 Overweight: Secondary | ICD-10-CM | POA: Diagnosis not present

## 2021-11-29 LAB — BASIC METABOLIC PANEL
BUN: 21 mg/dL (ref 6–23)
CO2: 29 mEq/L (ref 19–32)
Calcium: 10.5 mg/dL (ref 8.4–10.5)
Chloride: 101 mEq/L (ref 96–112)
Creatinine, Ser: 0.67 mg/dL (ref 0.40–1.20)
GFR: 88.98 mL/min (ref >60.00)
Glucose, Bld: 89 mg/dL (ref 70–99)
Potassium: 3.7 mEq/L (ref 3.5–5.1)
Sodium: 139 mEq/L (ref 135–145)

## 2021-11-29 LAB — LIPID PANEL
Cholesterol: 245 mg/dL — ABNORMAL HIGH (ref 0–200)
HDL: 51.2 mg/dL (ref >39.00)
NonHDL: 193.47
Total CHOL/HDL Ratio: 5
Triglycerides: 275 mg/dL — ABNORMAL HIGH (ref 0.0–149.0)
VLDL: 55 mg/dL — ABNORMAL HIGH (ref 0.0–40.0)

## 2021-11-29 LAB — HEPATIC FUNCTION PANEL
ALT: 16 U/L (ref 0–35)
AST: 18 U/L (ref 0–37)
Albumin: 4.5 g/dL (ref 3.5–5.2)
Alkaline Phosphatase: 61 U/L (ref 39–117)
Bilirubin, Direct: 0.1 mg/dL (ref 0.0–0.3)
Total Bilirubin: 0.9 mg/dL (ref 0.2–1.2)
Total Protein: 6.8 g/dL (ref 6.0–8.3)

## 2021-11-29 LAB — CBC WITH DIFFERENTIAL/PLATELET
Basophils Absolute: 0.1 10*3/uL (ref 0.0–0.1)
Basophils Relative: 0.9 % (ref 0.0–3.0)
Eosinophils Absolute: 0 10*3/uL (ref 0.0–0.7)
Eosinophils Relative: 0.6 % (ref 0.0–5.0)
HCT: 44.7 % (ref 36.0–46.0)
Hemoglobin: 15 g/dL (ref 12.0–15.0)
Lymphocytes Relative: 25.2 % (ref 12.0–46.0)
Lymphs Abs: 1.5 10*3/uL (ref 0.7–4.0)
MCHC: 33.5 g/dL (ref 30.0–36.0)
MCV: 93.8 fl (ref 78.0–100.0)
Monocytes Absolute: 0.6 10*3/uL (ref 0.1–1.0)
Monocytes Relative: 9.6 % (ref 3.0–12.0)
Neutro Abs: 3.8 10*3/uL (ref 1.4–7.7)
Neutrophils Relative %: 63.7 % (ref 43.0–77.0)
Platelets: 188 10*3/uL (ref 150.0–400.0)
RBC: 4.77 Mil/uL (ref 3.87–5.11)
RDW: 13 % (ref 11.5–15.5)
WBC: 6 10*3/uL (ref 4.0–10.5)

## 2021-11-29 LAB — TSH: TSH: 0.86 u[IU]/mL (ref 0.35–5.50)

## 2021-11-29 LAB — LDL CHOLESTEROL, DIRECT: Direct LDL: 155 mg/dL

## 2021-11-29 MED ORDER — MELOXICAM 15 MG PO TABS: 15.0000 mg | ORAL_TABLET | Freq: Every day | ORAL | 0 refills | Status: DC

## 2021-11-29 NOTE — Assessment & Plan Note (Signed)
Pt is down 10 lbs and her BMI is now officially in the normal range at 24.55!  Applauded her efforts.  Encouraged her to continue.  Will follow.

## 2021-11-29 NOTE — Patient Instructions (Signed)
Follow up in 6 months to recheck cholesterol We'll notify you of your lab results and make any changes if needed Continue to work on healthy diet and regular exercise- you're doing great!! RESTART the Meloxicam once daily- w/ food Schedule your GI follow up this Spring Call with any questions of concerns Stay Safe!  Stay Healthy! Happy New Year! Hang in there!!!

## 2021-11-29 NOTE — Assessment & Plan Note (Signed)
S/p R knee replacement.  Unfortunately the recovery has been prolonged and more difficult than anticipated.  She had manipulation 12/7 and since then, she's been doing better and gaining more ROM.  Will restart meloxicam in addition to the Tylenol and Oxycodone that she is taking.  Pt expressed understanding and is in agreement w/ plan.

## 2021-11-29 NOTE — Progress Notes (Signed)
° °  Subjective:    Patient ID: Kristen [name], female    DOB: June 06, 1951, 71 y.o.   MRN: [id number]  HPI Hyperlipidemia- pt's last LDL 156.  Total cholesterol 244, Trigs 281, HDL 56.  Since then, she has lost 10lbs.  She did not tolerate Red Yeast Rice- hives.  So the idea of taking a statin scares pt.  Denies CP, SOB, abd pain, N/V.  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  Pt reports the surgery was more difficult than expected and she required manipulation.  Is hoping to be released from PT at the end of the month.  Is asking to restart Meloxicam for aches and pains.  Colon cancer screen- pt is due for repeat colonoscopy as she is on a 5 yr schedule w/ Dr Havery Moros.  Pt plans to schedule this spring  Review of Systems For ROS see HPI   This visit occurred during the SARS-CoV-2 public health emergency.  Safety protocols were in place, including screening questions prior to the visit, additional usage of staff PPE, and extensive cleaning of exam room while observing appropriate contact time as indicated for disinfecting solutions.      Objective:   Physical Exam Vitals reviewed.  Constitutional:      General: She is not in acute distress.    Appearance: Normal appearance. She is well-developed. She is not ill-appearing.  HENT:     Head: Normocephalic and atraumatic.  Eyes:     Conjunctiva/sclera: Conjunctivae normal.     Pupils: Pupils are equal, round, and reactive to light.  Neck:     Thyroid: No thyromegaly.  Cardiovascular:     Rate and Rhythm: Normal rate and regular rhythm.     Pulses: Normal pulses.     Heart sounds: Normal heart sounds. No murmur heard. Pulmonary:     Effort: Pulmonary effort is normal. No respiratory distress.     Breath sounds: Normal breath sounds.  Abdominal:     General: There is no distension.     Palpations: Abdomen is soft.     Tenderness: There is no abdominal tenderness.  Musculoskeletal:     Cervical back: Normal range of motion and neck  supple.     Right lower leg: No edema.     Left lower leg: No edema.  Lymphadenopathy:     Cervical: No cervical adenopathy.  Skin:    General: Skin is warm and dry.  Neurological:     General: No focal deficit present.     Mental Status: She is alert and oriented to person, place, and time.  Psychiatric:        Mood and Affect: Mood normal.        Behavior: Behavior normal.          Assessment & Plan:

## 2021-11-29 NOTE — Assessment & Plan Note (Signed)
Pt is aware that she is due for repeat colonoscopy.  Plans to schedule this spring once she is more recovered from knee replacement.

## 2021-11-29 NOTE — Assessment & Plan Note (Signed)
Ongoing issue for pt.  Last LDL 156.  She is fearful of taking a statin b/c she had hives w/ Red Yeast Rice.  Discussed that there were other options- such as Zetia- that are not statin based.  She has lost 10 lbs and is hoping that has impacted her cholesterol numbers.  Check labs and start meds prn.

## 2021-11-30 ENCOUNTER — Other Ambulatory Visit: Payer: Self-pay

## 2021-11-30 ENCOUNTER — Encounter: Payer: Self-pay | Admitting: Family Medicine

## 2021-11-30 DIAGNOSIS — Z1231 Encounter for screening mammogram for malignant neoplasm of breast: Secondary | ICD-10-CM | POA: Diagnosis not present

## 2021-11-30 LAB — HM MAMMOGRAPHY

## 2021-11-30 MED ORDER — EZETIMIBE 10 MG PO TABS: 10.0000 mg | ORAL_TABLET | Freq: Every day | ORAL | 1 refills | Status: DC

## 2021-12-01 ENCOUNTER — Encounter: Payer: Self-pay | Admitting: Family Medicine

## 2021-12-01 DIAGNOSIS — M25561 Pain in right knee: Secondary | ICD-10-CM | POA: Diagnosis not present

## 2021-12-06 DIAGNOSIS — H0011 Chalazion right upper eyelid: Secondary | ICD-10-CM | POA: Diagnosis not present

## 2021-12-06 DIAGNOSIS — H5203 Hypermetropia, bilateral: Secondary | ICD-10-CM | POA: Diagnosis not present

## 2021-12-06 DIAGNOSIS — H52221 Regular astigmatism, right eye: Secondary | ICD-10-CM | POA: Diagnosis not present

## 2021-12-06 DIAGNOSIS — M25561 Pain in right knee: Secondary | ICD-10-CM | POA: Diagnosis not present

## 2021-12-06 DIAGNOSIS — H524 Presbyopia: Secondary | ICD-10-CM | POA: Diagnosis not present

## 2021-12-08 ENCOUNTER — Encounter: Payer: Self-pay | Admitting: Gastroenterology

## 2021-12-08 DIAGNOSIS — M25561 Pain in right knee: Secondary | ICD-10-CM | POA: Diagnosis not present

## 2021-12-13 DIAGNOSIS — M25561 Pain in right knee: Secondary | ICD-10-CM | POA: Diagnosis not present

## 2021-12-15 NOTE — Progress Notes (Signed)
71 y.o. G16P2001 Married Caucasian female here for annual breast and pelvic exam.    Had right knee replacement in October, 2022.   51th anniversary celebration next week.   PCP:  [name], MD   No LMP recorded. Patient is postmenopausal.           Sexually active: No.  The current method of family planning is post menopausal status.    Exercising: Yes.     PT 6 days a week for knee replacement Smoker:  no  Health Maintenance: Pap:  09-23-19 Neg, 09-11-16 Neg, 09-02-13 Neg History of abnormal Pap:  no MMG:  11-30-21 normal per patient:[facility] Colonoscopy:  01-25-16 polyps; scheduled 01-19-22 BMD: 10/2017  Result :Normal TDaP:  03-27-12.  Wants today. Gardasil:   no HIV: no Hep C: no Screening Labs:  PCP Covid bivalent:  completed.  Flu vaccine:  completed.    reports that she has never smoked. She has never used smokeless tobacco. She reports current alcohol use of about 1.0 standard drink per week. She reports that she does not use drugs.  Past Medical History:  Diagnosis Date   Arthritis    In hands   Asthma    seasonal   Hearing aid worn    Bil   History of kidney stones    History of shingles 2007   Shingles shot current   Hyperlipidemia    Hypothyroid    Kidney stones, calcium oxalate    Lithrotripsy x1; surgical removal X1  via cystoscopy   Migraine    menopausal   Pneumonia 4-5- yrs ago   "walking pneumonia"    Past Surgical History:  Procedure Laterality Date   COLONOSCOPY     negative ; initially age 62; Dr Olevia Perches   CYSTOSCOPY     X 2; Dr Serita Butcher   CYSTOSCOPY WITH RETROGRADE PYELOGRAM, URETEROSCOPY AND STENT PLACEMENT Left 12/03/2019   Procedure: [place], URETEROSCOPY AND STENT PLACEMENT;  Surgeon: Alexis Frock, MD;  Location: [facility];  Service: Urology;  Laterality: Left;  1 HR   FOOT SURGERY  2012   great toe    HOLMIUM LASER APPLICATION Left 01/24/9701   Procedure: HOLMIUM LASER APPLICATION;   Surgeon: Alexis Frock, MD;  Location: [facility];  Service: Urology;  Laterality: Left;   INJECTION KNEE     LITHOTRIPSY     X1   MOUTH SURGERY     extra bone removed in mouth 30 years ago   [place] Right 09/02/2021   Procedure: TOTAL KNEE ARTHROPLASTY;  Surgeon: Netta Cedars, MD;  Location: [facility];  Service: Orthopedics;  Laterality: Right;  with adductor canal   WISDOM TOOTH EXTRACTION      Current Outpatient Medications  Medication Sig Dispense Refill   acetaminophen (TYLENOL) 500 MG tablet Take 500 mg by mouth every 6 (six) hours as needed.     albuterol (VENTOLIN HFA) 108 (90 Base) MCG/ACT inhaler Inhale 2 puffs into the lungs every 6 (six) hours as needed for wheezing or shortness of breath. 1 each 2   AMBULATORY NON FORMULARY MEDICATION Medication Name: T3/T4 12.5 mcg-25 mcg capsule. Take 1 capsule by mouth every morning 90 capsule 1   Ascorbic Acid (VITAMIN C PO) Take 1 tablet by mouth 2 (two) times daily.      butalbital-aspirin-caffeine (FIORINAL) 50-325-40 MG capsule TAKE ONE CAPSULE BY MOUTH EVERY 6 HOURS AS NEEDED FOR HEADACHE (Patient taking differently: Take  1 capsule by mouth every 6 (six) hours as needed for headache.) 30 capsule 1   Calcium Citrate-Vitamin D (CALCIUM CITRATE + PO) Take 1 tablet by mouth daily.     ezetimibe (ZETIA) 10 MG tablet Take 1 tablet (10 mg total) by mouth daily. For cholesterol 90 tablet 1   GLUCOSAMINE PO Take 1,500 mg by mouth 2 (two) times daily.     indapamide (LOZOL) 1.25 MG tablet Take 1.25 mg by mouth daily.     Magnesium 80 MG TABS Take 1 tablet by mouth 2 (two) times daily.     meloxicam (MOBIC) 15 MG tablet Take 1 tablet (15 mg total) by mouth daily. 90 tablet 0   methocarbamol (ROBAXIN) 500 MG tablet Take 1 tablet (500 mg total) by mouth every 8 (eight) hours as needed for muscle spasms. 40 tablet 1   Multiple Vitamin (MULTIVITAMIN) tablet Take 1 tablet by mouth  daily. MinRX dietary supplement: 1 by mouth daily     Omega-3 Fatty Acids (FISH OIL) 1000 MG CAPS Take 1,000 mg by mouth 2 (two) times daily.     Probiotic Product (PROBIOTIC DAILY PO) Take 1 tablet by mouth daily.     Zinc Sulfate (ZINC 15 PO) Take 10 mg by mouth in the morning and at bedtime.     No current facility-administered medications for this visit.    Family History  Problem Relation Age of Onset   Dementia Mother    COPD Mother    Lung cancer Mother        smoker   Cancer Father        Colon and Bladder cancer   Diabetes Father    Hypertension Father    Colon cancer Father    Cancer Paternal Grandfather        prostate/ colon   Colon cancer Paternal Grandfather    Heart failure Paternal Grandmother    Stroke Paternal Grandmother 66   Melanoma Maternal Grandmother    Diabetes Brother     Review of Systems  All other systems reviewed and are negative.  Exam:   BP 126/80    Pulse 74    Ht 5\' 3"  (1.6 m)    Wt 143 lb (64.9 kg)    SpO2 98%    BMI 25.33 kg/m     General appearance: alert, cooperative and appears stated age Head: normocephalic, without obvious abnormality, atraumatic Neck: no adenopathy, supple, symmetrical, trachea midline and thyroid normal to inspection and palpation Lungs: clear to auscultation bilaterally Breasts: normal appearance, no masses or tenderness, No nipple retraction or dimpling, No nipple discharge or bleeding, No axillary adenopathy Heart: regular rate and rhythm Abdomen: soft, non-tender; no masses, no organomegaly Extremities: extremities normal, atraumatic, no cyanosis or edema Skin: skin color, texture, turgor normal. No rashes or lesions Lymph nodes: cervical, supraclavicular, and axillary nodes normal. Neurologic: grossly normal  Pelvic: External genitalia:  no lesions              No abnormal inguinal nodes palpated.              Urethra:  normal appearing urethra with no masses, tenderness or lesions               Bartholins and Skenes: normal                 Vagina: normal appearing vagina with normal color and discharge, no lesions  Cervix: no lesions              Pap taken: yes Bimanual Exam:  Uterus:  normal size, contour, position, consistency, mobility, non-tender              Adnexa: no mass, fullness, tenderness              Rectal exam: yes.  Confirms.              Anus:  normal sphincter tone, no lesions  Chaperone was present for exam:  Estill Bamberg, CMA  Assessment:   Well woman visit with gynecologic exam. Cervical cancer screening.  FH colon cancer.   Plan: Mammogram screening discussed. Pap and HR HPV collected. Guidelines for Calcium, Vitamin D, regular exercise program including cardiovascular and weight bearing exercise. TDap today.  Follow up for breast and pelvic exam in 2 years and prn.   After visit summary provided.

## 2021-12-19 ENCOUNTER — Ambulatory Visit (INDEPENDENT_AMBULATORY_CARE_PROVIDER_SITE_OTHER): Payer: Medicare Other | Admitting: Obstetrics and Gynecology

## 2021-12-19 ENCOUNTER — Other Ambulatory Visit (HOSPITAL_COMMUNITY)
Admission: RE | Admit: 2021-12-19 | Discharge: 2021-12-19 | Disposition: A | Discharge status: Home / Self Care | Payer: Medicare Other | Source: Ambulatory Visit | Attending: Obstetrics and Gynecology | Admitting: Obstetrics and Gynecology

## 2021-12-19 ENCOUNTER — Encounter: Payer: Self-pay | Admitting: Obstetrics and Gynecology

## 2021-12-19 ENCOUNTER — Other Ambulatory Visit: Payer: Self-pay

## 2021-12-19 VITALS — BP 126/80 | HR 74 | Ht 63.0 in | Wt 143.0 lb

## 2021-12-19 DIAGNOSIS — Z1151 Encounter for screening for human papillomavirus (HPV): Secondary | ICD-10-CM | POA: Insufficient documentation

## 2021-12-19 DIAGNOSIS — Z01419 Encounter for gynecological examination (general) (routine) without abnormal findings: Secondary | ICD-10-CM | POA: Diagnosis not present

## 2021-12-19 DIAGNOSIS — Z23 Encounter for immunization: Secondary | ICD-10-CM | POA: Diagnosis not present

## 2021-12-19 DIAGNOSIS — Z124 Encounter for screening for malignant neoplasm of cervix: Secondary | ICD-10-CM | POA: Diagnosis not present

## 2021-12-19 NOTE — Patient Instructions (Signed)

## 2021-12-20 LAB — CYTOLOGY - PAP
Comment: NEGATIVE
Diagnosis: NEGATIVE
High risk HPV: NEGATIVE

## 2022-01-05 ENCOUNTER — Ambulatory Visit (AMBULATORY_SURGERY_CENTER): Payer: Medicare Other | Admitting: *Deleted

## 2022-01-05 ENCOUNTER — Other Ambulatory Visit: Payer: Self-pay

## 2022-01-05 VITALS — Ht 63.0 in | Wt 140.0 lb

## 2022-01-05 DIAGNOSIS — Z86010 Personal history of colonic polyps: Secondary | ICD-10-CM

## 2022-01-05 DIAGNOSIS — Z800 Family history of malignant neoplasm of digestive organs: Secondary | ICD-10-CM

## 2022-01-05 MED ORDER — NA SULFATE-K SULFATE-MG SULF 17.5-3.13-1.6 GM/177ML PO SOLN: 1.0000 | ORAL | 0 refills | Status: DC

## 2022-01-05 NOTE — Progress Notes (Signed)

## 2022-01-19 ENCOUNTER — Telehealth: Payer: Self-pay

## 2022-01-19 ENCOUNTER — Encounter: Payer: Self-pay | Admitting: Gastroenterology

## 2022-01-19 ENCOUNTER — Ambulatory Visit (AMBULATORY_SURGERY_CENTER): Payer: Medicare Other | Admitting: Gastroenterology

## 2022-01-19 VITALS — BP 99/58 | HR 58 | Temp 98.0°F | Resp 10 | Ht 63.0 in | Wt 140.0 lb

## 2022-01-19 DIAGNOSIS — D128 Benign neoplasm of rectum: Secondary | ICD-10-CM

## 2022-01-19 DIAGNOSIS — D125 Benign neoplasm of sigmoid colon: Secondary | ICD-10-CM

## 2022-01-19 DIAGNOSIS — D120 Benign neoplasm of cecum: Secondary | ICD-10-CM | POA: Diagnosis not present

## 2022-01-19 DIAGNOSIS — Z86010 Personal history of colonic polyps: Secondary | ICD-10-CM | POA: Diagnosis not present

## 2022-01-19 DIAGNOSIS — D123 Benign neoplasm of transverse colon: Secondary | ICD-10-CM

## 2022-01-19 DIAGNOSIS — Z800 Family history of malignant neoplasm of digestive organs: Secondary | ICD-10-CM | POA: Diagnosis not present

## 2022-01-19 DIAGNOSIS — D122 Benign neoplasm of ascending colon: Secondary | ICD-10-CM | POA: Diagnosis not present

## 2022-01-19 DIAGNOSIS — D129 Benign neoplasm of anus and anal canal: Secondary | ICD-10-CM

## 2022-01-19 DIAGNOSIS — D127 Benign neoplasm of rectosigmoid junction: Secondary | ICD-10-CM | POA: Diagnosis not present

## 2022-01-19 MED ORDER — AMBULATORY NON FORMULARY MEDICATION: 1 refills | Status: DC

## 2022-01-19 MED ORDER — SODIUM CHLORIDE 0.9 % IV SOLN
500.0000 mL | INTRAVENOUS | Status: DC
Start: 2022-01-19 — End: 2022-01-19

## 2022-01-19 NOTE — Progress Notes (Signed)
Pt's states no medical or surgical changes since previsit or office visit. 

## 2022-01-19 NOTE — Telephone Encounter (Signed)
Faxed today

## 2022-01-19 NOTE — Op Note (Signed)
[facility] ?Patient Name: Kristen [name] ?Procedure Date: 01/19/2022 1:11 PM ?MRN: [id number] ?Endoscopist: Carlota Raspberry. Havery Moros , MD ?Age: 71 ?Referring MD:  ?Date of Birth: 30-Dec-1950 ?Gender: Female ?Account #: [id number] ?Procedure:                Colonoscopy ?Indications:              High risk colon cancer surveillance: Personal  ?                          history of colonic polyps (history of adenomas  ?                          removed 01/2016, father with colon cancer dx age 70s) ?Medicines:                Monitored Anesthesia Care ?Procedure:                Pre-Anesthesia Assessment: ?                          - Prior to the procedure, a History and Physical  ?                          was performed, and patient medications and  ?                          allergies were reviewed. The patient's tolerance of  ?                          previous anesthesia was also reviewed. The risks  ?                          and benefits of the procedure and the sedation  ?                          options and risks were discussed with the patient.  ?                          All questions were answered, and informed consent  ?                          was obtained. Prior Anticoagulants: The patient has  ?                          taken no previous anticoagulant or antiplatelet  ?                          agents. ASA Grade Assessment: II - A patient with  ?                          mild systemic disease. After reviewing the risks  ?                          and benefits, the patient was deemed in  ?  satisfactory condition to undergo the procedure. ?                          After obtaining informed consent, the colonoscope  ?                          was passed under direct vision. Throughout the  ?                          procedure, the patient's blood pressure, pulse, and  ?                          oxygen saturations were monitored continuously. The  ?                           Olympus PCF-H190DL (#[id number]) Colonoscope was  ?                          introduced through the anus and advanced to the the  ?                          cecum, identified by appendiceal orifice and  ?                          ileocecal valve. The colonoscopy was performed  ?                          without difficulty. The patient tolerated the  ?                          procedure well. The quality of the bowel  ?                          preparation was good. The ileocecal valve,  ?                          appendiceal orifice, and rectum were photographed. ?Scope In: 1:26:56 PM ?Scope Out: 1:48:16 PM ?Scope Withdrawal Time: 0 hours 17 minutes 1 second  ?Total Procedure Duration: 0 hours 21 minutes 20 seconds  ?Findings:                 The perianal and digital rectal examinations were  ?                          normal. ?                          A diminutive polyp was found in the cecum. The  ?                          polyp was sessile. The polyp was removed with a  ?                          cold snare. Resection and retrieval were complete. ?  Two sessile polyps were found in the ascending  ?                          colon. The polyps were 2 to 3 mm in size. These  ?                          polyps were removed with a cold snare. Resection  ?                          and retrieval were complete. ?                          Two sessile polyps were found in the hepatic  ?                          flexure. The polyps were 2 to 3 mm in size. These  ?                          polyps were removed with a cold snare. Resection  ?                          and retrieval were complete. ?                          A 3 mm polyp was found in the sigmoid colon. The  ?                          polyp was sessile. The polyp was removed with a  ?                          cold snare. Resection and retrieval were complete. ?                          A 3 mm polyp was found in the rectum. The polyp was  ?                           sessile. The polyp was removed with a cold snare.  ?                          Resection and retrieval were complete. ?                          Multiple small-mouthed diverticula were found in  ?                          the sigmoid colon. ?                          The exam was otherwise without abnormality. ?Complications:            No immediate complications. Estimated blood loss:  ?  Minimal. ?Estimated Blood Loss:     Estimated blood loss was minimal. ?Impression:               - One diminutive polyp in the cecum, removed with a  ?                          cold snare. Resected and retrieved. ?                          - Two 2 to 3 mm polyps in the ascending colon,  ?                          removed with a cold snare. Resected and retrieved. ?                          - Two 2 to 3 mm polyps at the hepatic flexure,  ?                          removed with a cold snare. Resected and retrieved. ?                          - One 3 mm polyp in the sigmoid colon, removed with  ?                          a cold snare. Resected and retrieved. ?                          - One 3 mm polyp in the rectum, removed with a cold  ?                          snare. Resected and retrieved. ?                          - Diverticulosis in the sigmoid colon. ?                          - The examination was otherwise normal. ?Recommendation:           - Patient has a contact number available for  ?                          emergencies. The signs and symptoms of potential  ?                          delayed complications were discussed with the  ?                          patient. Return to normal activities tomorrow.  ?                          Written discharge instructions were provided to the  ?                          patient. ?                          -  Resume previous diet. ?                          - Continue present medications. ?                          - Await pathology  results. ?Carlota Raspberry. Havery Moros, MD ?01/19/2022 1:54:24 PM ?This report has been signed electronically. ?

## 2022-01-19 NOTE — Telephone Encounter (Signed)
Since this is a compounded medication, I need to print and fax to her pharmacy b/c it cannot be sent electronically.  This was done today. ?

## 2022-01-19 NOTE — Progress Notes (Signed)
Called to room to assist during endoscopic procedure.  Patient ID and intended procedure confirmed with present staff. Received instructions for my participation in the procedure from the performing physician.  

## 2022-01-19 NOTE — Progress Notes (Signed)
Sedate, gd SR, tolerated procedure well, VSS, report to RN 

## 2022-01-19 NOTE — Patient Instructions (Addendum)
Thank you for allowing [place] to care for you today. ?Resume previous diet and medications today. ?Return to normal daily activities tomorrow. ?Will make recommendations for future colonoscopy based on polyp biopsy results. ? ? ?YOU HAD AN ENDOSCOPIC PROCEDURE TODAY AT [facility]:   Refer to the procedure report that was given to you for any specific questions about what was found during the examination.  If the procedure report does not answer your questions, please call your gastroenterologist to clarify.  If you requested that your care partner not be given the details of your procedure findings, then the procedure report has been included in a sealed envelope for you to review at your convenience later. ? ?YOU SHOULD EXPECT: Some feelings of bloating in the abdomen. Passage of more gas than usual.  Walking can help get rid of the air that was put into your GI tract during the procedure and reduce the bloating. If you had a lower endoscopy (such as a colonoscopy or flexible sigmoidoscopy) you may notice spotting of blood in your stool or on the toilet paper. If you underwent a bowel prep for your procedure, you may not have a normal bowel movement for a few days. ? ?Please Note:  You might notice some irritation and congestion in your nose or some drainage.  This is from the oxygen used during your procedure.  There is no need for concern and it should clear up in a day or so. ? ?SYMPTOMS TO REPORT IMMEDIATELY: ? ?Following lower endoscopy (colonoscopy or flexible sigmoidoscopy): ? Excessive amounts of blood in the stool ? Significant tenderness or worsening of abdominal pains ? Swelling of the abdomen that is new, acute ? Fever of 100?F or higher ? ? ? ?For urgent or emergent issues, a gastroenterologist can be reached at any hour by calling [phone]. ?Do not use MyChart messaging for urgent concerns.  ? ? ?DIET:  We do recommend a small meal at first, but then you may proceed to your regular  diet.  Drink plenty of fluids but you should avoid alcoholic beverages for 24 hours. ? ?ACTIVITY:  You should plan to take it easy for the rest of today and you should NOT DRIVE or use heavy machinery until tomorrow (because of the sedation medicines used during the test).   ? ?FOLLOW UP: ?Our staff will call the number listed on your records 48-72 hours following your procedure to check on you and address any questions or concerns that you may have regarding the information given to you following your procedure. If we do not reach you, we will leave a message.  We will attempt to reach you two times.  During this call, we will ask if you have developed any symptoms of COVID 19. If you develop any symptoms (ie: fever, flu-like symptoms, shortness of breath, cough etc.) before then, please call [phone].  If you test positive for Covid 19 in the 2 weeks post procedure, please call and report this information to [place].   ? ?If any biopsies were taken you will be contacted by phone or by letter within the next 1-3 weeks.  Please call us at [phone] if you have not heard about the biopsies in 3 weeks.  ? ? ?SIGNATURES/CONFIDENTIALITY: ?You and/or your care partner have signed paperwork which will be entered into your electronic medical record.  These signatures attest to the fact that that the information above on your After Visit Summary has been reviewed and is  understood.  Full responsibility of the confidentiality of this discharge information lies with you and/or your care-partner.  ?

## 2022-01-19 NOTE — Progress Notes (Signed)
[facility] History and Physical   Primary Care Physician:  Midge Minium, MD   Reason for Procedure:   FH of colon cancer, history of colon polyps  Plan:    colonoscopy     HPI: Kristen [name] is a 71 y.o. female  here for colonoscopy surveillance - a few adenomas removed 01/2016. Father had colon cancer age 57s. Patient denies any bowel symptoms at this time. Otherwise feels well without any cardiopulmonary symptoms.    Past Medical History:  Diagnosis Date   Arthritis    In hands   Asthma    seasonal   Hearing aid worn    Bil   History of kidney stones    History of shingles 2007   Shingles shot current   Hyperlipidemia    Hypothyroid    Kidney stones, calcium oxalate    Lithrotripsy x1; surgical removal X1  via cystoscopy   Migraine    menopausal   Pneumonia 4-5- yrs ago   "walking pneumonia"    Past Surgical History:  Procedure Laterality Date   COLONOSCOPY     negative ; initially age 43; Dr Olevia Perches   COLONOSCOPY WITH PROPOFOL  01/2016   Dr.Marquise Lambson   CYSTOSCOPY     X 2; Dr Serita Butcher   CYSTOSCOPY WITH RETROGRADE PYELOGRAM, URETEROSCOPY AND STENT PLACEMENT Left 12/03/2019   Procedure: [place], URETEROSCOPY AND STENT PLACEMENT;  Surgeon: Alexis Frock, MD;  Location: [facility];  Service: Urology;  Laterality: Left;  1 HR   FOOT SURGERY  2012   great toe    HOLMIUM LASER APPLICATION Left 85/46/2703   Procedure: HOLMIUM LASER APPLICATION;  Surgeon: Alexis Frock, MD;  Location[facility];  Service: Urology;  Laterality: Left;   INJECTION KNEE     LITHOTRIPSY     X1   MOUTH SURGERY     extra bone removed in mouth 30 years ago   [place] Right 09/02/2021   Procedure: TOTAL KNEE ARTHROPLASTY;  Surgeon: Netta Cedars, MD;  Location: [facility];  Service: Orthopedics;  Laterality: Right;  with adductor canal   WISDOM TOOTH  EXTRACTION      Prior to Admission medications   Medication Sig Start Date End Date Taking? Authorizing Provider  AMBULATORY NON FORMULARY MEDICATION T3/T4 12.5 mcg-25 mcg capsule. Take 1 capsule by mouth every morning 01/19/22  Yes Midge Minium, MD  Ascorbic Acid (VITAMIN C PO) Take 1 tablet by mouth 2 (two) times daily.    Yes [provider]  Calcium Citrate-Vitamin D (CALCIUM CITRATE + PO) Take 1 tablet by mouth daily.   Yes [provider]  ezetimibe (ZETIA) 10 MG tablet Take 1 tablet (10 mg total) by mouth daily. For cholesterol 11/30/21  Yes Midge Minium, MD  GLUCOSAMINE PO Take 1,500 mg by mouth 2 (two) times daily.   Yes [provider]  indapamide (LOZOL) 1.25 MG tablet Take 1.25 mg by mouth daily. 07/07/20  Yes [provider]  Magnesium 80 MG TABS Take 1 tablet by mouth 2 (two) times daily.   Yes [provider]  meloxicam (MOBIC) 15 MG tablet Take 1 tablet (15 mg total) by mouth daily. 11/29/21  Yes Midge Minium, MD  Multiple Vitamin (MULTIVITAMIN) tablet Take 1 tablet by mouth daily. MinRX dietary supplement: 1 by mouth daily   Yes [provider]  Na Sulfate-K Sulfate-Mg Sulf 17.5-3.13-1.6 GM/177ML SOLN Take 1  kit by mouth as directed. May use generic Suprep 01/05/22  Yes Auda Finfrock, Carlota Raspberry, MD  Omega-3 Fatty Acids (FISH OIL) 1000 MG CAPS Take 1,000 mg by mouth 2 (two) times daily.   Yes [provider]  Probiotic Product (PROBIOTIC DAILY PO) Take 1 tablet by mouth daily.   Yes [provider]  albuterol (VENTOLIN HFA) 108 (90 Base) MCG/ACT inhaler Inhale 2 puffs into the lungs every 6 (six) hours as needed for wheezing or shortness of breath. Patient not taking: Reported on 01/05/2022 03/30/21   Midge Minium, MD  butalbital-aspirin-caffeine [facility]) [phone] MG capsule TAKE ONE CAPSULE BY MOUTH EVERY 6 HOURS AS NEEDED FOR HEADACHE Patient not taking: Reported on 01/05/2022 12/09/18    [name], MD    Current Outpatient Medications  Medication Sig Dispense Refill   AMBULATORY NON FORMULARY MEDICATION T3/T4 12.5 mcg-25 mcg capsule. Take 1 capsule by mouth every morning 90 capsule 1   Ascorbic Acid (VITAMIN C PO) Take 1 tablet by mouth 2 (two) times daily.      Calcium Citrate-Vitamin D (CALCIUM CITRATE + PO) Take 1 tablet by mouth daily.     ezetimibe (ZETIA) 10 MG tablet Take 1 tablet (10 mg total) by mouth daily. For cholesterol 90 tablet 1   GLUCOSAMINE PO Take 1,500 mg by mouth 2 (two) times daily.     indapamide (LOZOL) 1.25 MG tablet Take 1.25 mg by mouth daily.     Magnesium 80 MG TABS Take 1 tablet by mouth 2 (two) times daily.     meloxicam (MOBIC) 15 MG tablet Take 1 tablet (15 mg total) by mouth daily. 90 tablet 0   Multiple Vitamin (MULTIVITAMIN) tablet Take 1 tablet by mouth daily. MinRX dietary supplement: 1 by mouth daily     Na Sulfate-K Sulfate-Mg Sulf 17.5-3.13-1.6 GM/177ML SOLN Take 1 kit by mouth as directed. May use generic Suprep 354 mL 0   Omega-3 Fatty Acids (FISH OIL) 1000 MG CAPS Take 1,000 mg by mouth 2 (two) times daily.     Probiotic Product (PROBIOTIC DAILY PO) Take 1 tablet by mouth daily.     albuterol (VENTOLIN HFA) 108 (90 Base) MCG/ACT inhaler Inhale 2 puffs into the lungs every 6 (six) hours as needed for wheezing or shortness of breath. (Patient not taking: Reported on 01/05/2022) 1 each 2   butalbital-aspirin-caffeine (FIORINAL) 50-325-40 MG capsule TAKE ONE CAPSULE BY MOUTH EVERY 6 HOURS AS NEEDED FOR HEADACHE (Patient not taking: Reported on 01/05/2022) 30 capsule 1   Current Facility-Administered Medications  Medication Dose Route Frequency Provider Last Rate Last Admin   0.9 %  sodium chloride infusion  500 mL Intravenous Continuous Tiwanda Threats, Carlota Raspberry, MD        Allergies as of 01/19/2022 - Review Complete 01/19/2022  Allergen Reaction Noted   Gantrisin [sulfisoxazole]  08/10/2011   Penicillins  08/22/2010   Red yeast  rice [cholestin] Hives 01/23/2017   Sulfa antibiotics  08/10/2011    Family History  Problem Relation Age of Onset   Dementia Mother    COPD Mother    Lung cancer Mother        smoker   Cancer Father        Colon and Bladder cancer   Diabetes Father    Hypertension Father    Colon cancer Father 68   Diabetes Brother    Melanoma Maternal Grandmother    Heart failure Paternal Grandmother    Stroke Paternal Grandmother 87   Cancer Paternal  Grandfather        prostate/ colon   Colon cancer Paternal Grandfather    Colon polyps Neg Hx    Esophageal cancer Neg Hx    Rectal cancer Neg Hx    Stomach cancer Neg Hx     Social History   Socioeconomic History   Marital status: Married    Spouse name: Not on file   Number of children: 2   Years of education: Not on file   Highest education level: Not on file  Occupational History   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)   Tobacco Use   Smoking status: Never   Smokeless tobacco: Never  Vaping Use   Vaping Use: Never used  Substance and Sexual Activity   Alcohol use: Yes    Alcohol/week: 1.0 standard drink    Types: 1 Glasses of wine per week   Drug use: No   Sexual activity: Not Currently    Birth control/protection: Post-menopausal    Comment: 1st intercourse 59 yo-1 partner  Other Topics Concern   Not on file  Social History Narrative   4 Grandchildren    Hobbies: Gardening and Reading; Pension scheme manager    Social Determinants of Health   Financial Resource Strain: Not on file  Food Insecurity: Not on file  Transportation Needs: Not on file  Physical Activity: Not on file  Stress: Not on file  Social Connections: Not on file  Intimate Partner Violence: Not on file    Review of Systems: All other review of systems negative except as mentioned in the HPI.  Physical Exam: Vital signs BP 135/71    Pulse 73    Temp 98 F (36.7 C) (Temporal)    Ht _0  (1.6 m)    Wt  140 lb (63.5 kg)    SpO2 98%    BMI 24.80 kg/m   General:   Alert,  Well-developed, pleasant and cooperative in NAD Lungs:  Clear throughout to auscultation.   Heart:  Regular rate and rhythm Abdomen:  Soft, nontender and nondistended.   Neuro/Psych:  Alert and cooperative. Normal mood and affect. A and O x 3  Jolly Mango, MD [facility]

## 2022-01-19 NOTE — Telephone Encounter (Signed)
Encourage patient to contact the pharmacy for refills or they can request refills through [facility] ? ?(Please schedule appointment if patient has not been seen in over a year) ? ? ? ?WHAT PHARMACY WOULD THEY LIKE THIS SENT TO: [facility] [address] [place], [place] - [address]  ?[address], [place] [address]  ? ?MEDICATION NAME & DOSE:T3/T4 12.5 mcg-25 mcg capsule Thyroid med  ? ?NOTES/COMMENTS FROM PATIENT:Pt said pharmacy has sent this electronic twice  ? ? ? ? ? ?[place] office please notify patient: ?It takes 48-72 hours to process rx refill requests ?Ask patient to call pharmacy to ensure rx is ready before heading there.  ? ?

## 2022-01-23 ENCOUNTER — Telehealth: Payer: Self-pay | Admitting: *Deleted

## 2022-01-23 NOTE — Telephone Encounter (Signed)
?  Follow up Call- ? ?Call back number 01/19/2022  ?Post procedure Call Back phone  # [phone]  ?Permission to leave phone message Yes  ?Some recent data might be hidden  ?  ? ?Patient questions: ? ?Do you have a fever, pain , or abdominal swelling? No. ?Pain Score  0 * ? ?Have you tolerated food without any problems? Yes.   ? ?Have you been able to return to your normal activities? Yes.   ? ?Do you have any questions about your discharge instructions: ?Diet   No. ?Medications  No. ?Follow up visit  No. ? ?Do you have questions or concerns about your Care? No. ? ?Actions: ?* If pain score is 4 or above: ?No action needed, pain <4. ? ?Have you developed a fever since your procedure? no ? ?2.   Have you had an respiratory symptoms (SOB or cough) since your procedure? no ? ?3.   Have you tested positive for COVID 19 since your procedure no ? ?4.   Have you had any family members/close contacts diagnosed with the COVID 19 since your procedure?  no ? ? ?If yes to any of these questions please route to Joylene John, RN and Joella Prince, RN  ? ? ?

## 2022-02-09 DIAGNOSIS — N281 Cyst of kidney, acquired: Secondary | ICD-10-CM | POA: Diagnosis not present

## 2022-02-09 DIAGNOSIS — N200 Calculus of kidney: Secondary | ICD-10-CM | POA: Diagnosis not present

## 2022-02-20 ENCOUNTER — Ambulatory Visit: Payer: Medicare Other | Admitting: Family Medicine

## 2022-02-20 DIAGNOSIS — Z4789 Encounter for other orthopedic aftercare: Secondary | ICD-10-CM | POA: Diagnosis not present

## 2022-03-14 DIAGNOSIS — H5203 Hypermetropia, bilateral: Secondary | ICD-10-CM | POA: Diagnosis not present

## 2022-03-14 DIAGNOSIS — H1045 Other chronic allergic conjunctivitis: Secondary | ICD-10-CM | POA: Diagnosis not present

## 2022-03-14 DIAGNOSIS — H2513 Age-related nuclear cataract, bilateral: Secondary | ICD-10-CM | POA: Diagnosis not present

## 2022-03-14 DIAGNOSIS — H40013 Open angle with borderline findings, low risk, bilateral: Secondary | ICD-10-CM | POA: Diagnosis not present

## 2022-03-14 DIAGNOSIS — H0011 Chalazion right upper eyelid: Secondary | ICD-10-CM | POA: Diagnosis not present

## 2022-03-14 DIAGNOSIS — H52221 Regular astigmatism, right eye: Secondary | ICD-10-CM | POA: Diagnosis not present

## 2022-03-14 DIAGNOSIS — H524 Presbyopia: Secondary | ICD-10-CM | POA: Diagnosis not present

## 2022-04-02 ENCOUNTER — Other Ambulatory Visit: Payer: Self-pay | Admitting: Family

## 2022-05-12 ENCOUNTER — Other Ambulatory Visit: Payer: Self-pay | Admitting: Family Medicine

## 2022-05-31 ENCOUNTER — Other Ambulatory Visit: Payer: Self-pay | Admitting: Family Medicine

## 2022-06-05 ENCOUNTER — Ambulatory Visit: Payer: Medicare Other | Admitting: Family Medicine

## 2022-06-12 ENCOUNTER — Ambulatory Visit (INDEPENDENT_AMBULATORY_CARE_PROVIDER_SITE_OTHER): Payer: Medicare Other | Admitting: Family Medicine

## 2022-06-12 ENCOUNTER — Encounter: Payer: Self-pay | Admitting: Family Medicine

## 2022-06-12 VITALS — BP 126/80 | HR 70 | Temp 97.1°F | Resp 16 | Ht 63.0 in | Wt 150.2 lb

## 2022-06-12 DIAGNOSIS — E663 Overweight: Secondary | ICD-10-CM

## 2022-06-12 DIAGNOSIS — J45909 Unspecified asthma, uncomplicated: Secondary | ICD-10-CM | POA: Diagnosis not present

## 2022-06-12 DIAGNOSIS — E038 Other specified hypothyroidism: Secondary | ICD-10-CM | POA: Diagnosis not present

## 2022-06-12 DIAGNOSIS — E785 Hyperlipidemia, unspecified: Secondary | ICD-10-CM | POA: Diagnosis not present

## 2022-06-12 LAB — CBC WITH DIFFERENTIAL/PLATELET
Basophils Absolute: 0.1 10*3/uL (ref 0.0–0.1)
Basophils Relative: 1.2 % (ref 0.0–3.0)
Eosinophils Absolute: 0.1 10*3/uL (ref 0.0–0.7)
Eosinophils Relative: 1.5 % (ref 0.0–5.0)
HCT: 42.6 % (ref 36.0–46.0)
Hemoglobin: 14.9 g/dL (ref 12.0–15.0)
Lymphocytes Relative: 17.5 % (ref 12.0–46.0)
Lymphs Abs: 1.1 10*3/uL (ref 0.7–4.0)
MCHC: 35 g/dL (ref 30.0–36.0)
MCV: 93.3 fl (ref 78.0–100.0)
Monocytes Absolute: 0.6 10*3/uL (ref 0.1–1.0)
Monocytes Relative: 9 % (ref 3.0–12.0)
Neutro Abs: 4.6 10*3/uL (ref 1.4–7.7)
Neutrophils Relative %: 70.8 % (ref 43.0–77.0)
Platelets: 172 10*3/uL (ref 150.0–400.0)
RBC: 4.56 Mil/uL (ref 3.87–5.11)
RDW: 12.8 % (ref 11.5–15.5)
WBC: 6.4 10*3/uL (ref 4.0–10.5)

## 2022-06-12 LAB — TSH: TSH: 1.68 u[IU]/mL (ref 0.35–5.50)

## 2022-06-12 MED ORDER — AMBULATORY NON FORMULARY MEDICATION
1 refills | Status: DC
Start: 2022-06-12 — End: 2024-01-01

## 2022-06-12 MED ORDER — ALBUTEROL SULFATE HFA 108 (90 BASE) MCG/ACT IN AERS: 2.0000 | INHALATION_SPRAY | Freq: Four times a day (QID) | RESPIRATORY_TRACT | 2 refills | Status: DC | PRN

## 2022-06-12 NOTE — Addendum Note (Signed)
Addended by: Midge Minium on: 06/12/2022 09:17 AM   Modules accepted: Orders

## 2022-06-12 NOTE — Patient Instructions (Signed)
Follow up in 6 months to recheck cholesterol and thyroid We'll notify you of your lab results and make any changes if needed Keep up the good work on healthy diet and regular exercise- you're doing great! Call with any questions or concerns Stay Safe!  Stay Healthy! Enjoy the rest of your summer!!

## 2022-06-12 NOTE — Assessment & Plan Note (Signed)
Chronic problem.  Tolerating Zetia w/o difficulty.  Check labs.  Adjust meds prn

## 2022-06-12 NOTE — Assessment & Plan Note (Signed)
Pt has regained the weight she lost after knee surgery.  She is exercising regularly- applauded her efforts.  Will check labs to risk stratify.  Will follow.

## 2022-06-12 NOTE — Progress Notes (Signed)
   Subjective:    Patient ID: Kristen [name], female    DOB: January 08, 1951, 71 y.o.   MRN: [id number]  HPI Hyperlipidemia- chronic problem, on Zetia '10mg'$  daily.  No CP, SOB, abd pain, N/V.  Hypothyroid- chronic problem, was taking a T3/T4 compounded medication of 12.1mg/25mcg daily w/ good control.  Denies fatigue, changes to skin/hair/nails  Overweight- pt has gained 10 lbs since March.  BMI now 26.62  Exercising 2-3x/week, golfing 2-3x/week, gardening.   Review of Systems For ROS see HPI     Objective:   Physical Exam Vitals reviewed.  Constitutional:      General: She is not in acute distress.    Appearance: Normal appearance. She is well-developed. She is not ill-appearing.  HENT:     Head: Normocephalic and atraumatic.  Eyes:     Conjunctiva/sclera: Conjunctivae normal.     Pupils: Pupils are equal, round, and reactive to light.  Neck:     Thyroid: No thyromegaly.  Cardiovascular:     Rate and Rhythm: Normal rate and regular rhythm.     Pulses: Normal pulses.     Heart sounds: Normal heart sounds. No murmur heard. Pulmonary:     Effort: Pulmonary effort is normal. No respiratory distress.     Breath sounds: Normal breath sounds.  Abdominal:     General: There is no distension.     Palpations: Abdomen is soft.     Tenderness: There is no abdominal tenderness.  Musculoskeletal:     Cervical back: Normal range of motion and neck supple.     Right lower leg: No edema.     Left lower leg: No edema.  Lymphadenopathy:     Cervical: No cervical adenopathy.  Skin:    General: Skin is warm and dry.  Neurological:     General: No focal deficit present.     Mental Status: She is alert and oriented to person, place, and time.  Psychiatric:        Mood and Affect: Mood normal.        Behavior: Behavior normal.           Assessment & Plan:

## 2022-06-12 NOTE — Assessment & Plan Note (Signed)
Chronic problem, currently asymptomatic on compounded T3/T4.  Check labs.  Adjust meds prn

## 2022-06-13 LAB — HEPATIC FUNCTION PANEL
ALT: 16 U/L (ref 0–35)
AST: 21 U/L (ref 0–37)
Albumin: 4.5 g/dL (ref 3.5–5.2)
Alkaline Phosphatase: 64 U/L (ref 39–117)
Bilirubin, Direct: 0.2 mg/dL (ref 0.0–0.3)
Total Bilirubin: 1.3 mg/dL — ABNORMAL HIGH (ref 0.2–1.2)
Total Protein: 6.9 g/dL (ref 6.0–8.3)

## 2022-06-13 LAB — LIPID PANEL
Cholesterol: 205 mg/dL — ABNORMAL HIGH (ref 0–200)
HDL: 59.7 mg/dL (ref >39.00)
LDL Cholesterol: 110 mg/dL — ABNORMAL HIGH (ref 0–99)
NonHDL: 145.13
Total CHOL/HDL Ratio: 3
Triglycerides: 178 mg/dL — ABNORMAL HIGH (ref 0.0–149.0)
VLDL: 35.6 mg/dL (ref 0.0–40.0)

## 2022-06-13 LAB — BASIC METABOLIC PANEL
BUN: 18 mg/dL (ref 6–23)
CO2: 28 mEq/L (ref 19–32)
Calcium: 9.9 mg/dL (ref 8.4–10.5)
Chloride: 104 mEq/L (ref 96–112)
Creatinine, Ser: 0.76 mg/dL (ref 0.40–1.20)
GFR: 79.48 mL/min (ref >60.00)
Glucose, Bld: 83 mg/dL (ref 70–99)
Potassium: 3.7 mEq/L (ref 3.5–5.1)
Sodium: 142 mEq/L (ref 135–145)

## 2022-06-13 NOTE — Progress Notes (Signed)
Pt seen results via my chart  

## 2022-07-10 ENCOUNTER — Other Ambulatory Visit: Payer: Self-pay

## 2022-08-07 ENCOUNTER — Other Ambulatory Visit: Payer: Self-pay | Admitting: Family Medicine

## 2022-08-07 NOTE — Telephone Encounter (Signed)
Patient is requesting a refill of the following medications: Requested Prescriptions   Pending Prescriptions Disp Refills   meloxicam (MOBIC) 15 MG tablet [Pharmacy Med Name: MELOXICAM 15 MG TABLET] 90 tablet 0    Sig: TAKE 1 TABLET BY MOUTH EVERY DAY    Date of patient request: 08/07/22 Last office visit: 05/31/22 Date of last refill: 06/12/22 Last refill amount: 90

## 2022-08-29 DIAGNOSIS — Z96653 Presence of artificial knee joint, bilateral: Secondary | ICD-10-CM | POA: Diagnosis not present

## 2022-08-29 DIAGNOSIS — Z96651 Presence of right artificial knee joint: Secondary | ICD-10-CM | POA: Diagnosis not present

## 2022-09-04 ENCOUNTER — Ambulatory Visit (INDEPENDENT_AMBULATORY_CARE_PROVIDER_SITE_OTHER): Payer: Medicare Other | Admitting: Family Medicine

## 2022-09-04 DIAGNOSIS — Z23 Encounter for immunization: Secondary | ICD-10-CM | POA: Diagnosis not present

## 2022-09-04 NOTE — Progress Notes (Signed)
Patient presented today for influenza vaccine, administered without issue, no concerns expressed by patient

## 2022-11-03 ENCOUNTER — Other Ambulatory Visit: Payer: Self-pay | Admitting: Family Medicine

## 2022-12-06 DIAGNOSIS — Z1231 Encounter for screening mammogram for malignant neoplasm of breast: Secondary | ICD-10-CM | POA: Diagnosis not present

## 2022-12-06 LAB — HM MAMMOGRAPHY

## 2022-12-07 ENCOUNTER — Encounter: Payer: Self-pay | Admitting: Family Medicine

## 2022-12-19 ENCOUNTER — Encounter: Payer: Self-pay | Admitting: Family Medicine

## 2022-12-19 ENCOUNTER — Ambulatory Visit (INDEPENDENT_AMBULATORY_CARE_PROVIDER_SITE_OTHER): Payer: Medicare Other | Admitting: Family Medicine

## 2022-12-19 VITALS — BP 138/80 | HR 67 | Temp 97.9°F | Resp 18 | Ht 63.0 in | Wt 154.4 lb

## 2022-12-19 DIAGNOSIS — E663 Overweight: Secondary | ICD-10-CM

## 2022-12-19 DIAGNOSIS — E785 Hyperlipidemia, unspecified: Secondary | ICD-10-CM | POA: Diagnosis not present

## 2022-12-19 DIAGNOSIS — E038 Other specified hypothyroidism: Secondary | ICD-10-CM

## 2022-12-19 LAB — HEPATIC FUNCTION PANEL
ALT: 16 U/L (ref 0–35)
AST: 19 U/L (ref 0–37)
Albumin: 4.9 g/dL (ref 3.5–5.2)
Alkaline Phosphatase: 69 U/L (ref 39–117)
Bilirubin, Direct: 0.2 mg/dL (ref 0.0–0.3)
Total Bilirubin: 1.7 mg/dL — ABNORMAL HIGH (ref 0.2–1.2)
Total Protein: 7.2 g/dL (ref 6.0–8.3)

## 2022-12-19 LAB — BASIC METABOLIC PANEL
BUN: 19 mg/dL (ref 6–23)
CO2: 26 mEq/L (ref 19–32)
Calcium: 9.9 mg/dL (ref 8.4–10.5)
Chloride: 102 mEq/L (ref 96–112)
Creatinine, Ser: 0.71 mg/dL (ref 0.40–1.20)
GFR: 85.92 mL/min (ref >60.00)
Glucose, Bld: 78 mg/dL (ref 70–99)
Potassium: 3.7 mEq/L (ref 3.5–5.1)
Sodium: 141 mEq/L (ref 135–145)

## 2022-12-19 LAB — LIPID PANEL
Cholesterol: 220 mg/dL — ABNORMAL HIGH (ref 0–200)
HDL: 55.3 mg/dL (ref >39.00)
NonHDL: 164.74
Total CHOL/HDL Ratio: 4
Triglycerides: 277 mg/dL — ABNORMAL HIGH (ref 0.0–149.0)
VLDL: 55.4 mg/dL — ABNORMAL HIGH (ref 0.0–40.0)

## 2022-12-19 LAB — CBC WITH DIFFERENTIAL/PLATELET
Basophils Absolute: 0 10*3/uL (ref 0.0–0.1)
Basophils Relative: 0.7 % (ref 0.0–3.0)
Eosinophils Absolute: 0.1 10*3/uL (ref 0.0–0.7)
Eosinophils Relative: 1.2 % (ref 0.0–5.0)
HCT: 43.7 % (ref 36.0–46.0)
Hemoglobin: 15.5 g/dL — ABNORMAL HIGH (ref 12.0–15.0)
Lymphocytes Relative: 23.2 % (ref 12.0–46.0)
Lymphs Abs: 1.4 10*3/uL (ref 0.7–4.0)
MCHC: 35.4 g/dL (ref 30.0–36.0)
MCV: 92.5 fl (ref 78.0–100.0)
Monocytes Absolute: 0.6 10*3/uL (ref 0.1–1.0)
Monocytes Relative: 10.7 % (ref 3.0–12.0)
Neutro Abs: 3.8 10*3/uL (ref 1.4–7.7)
Neutrophils Relative %: 64.2 % (ref 43.0–77.0)
Platelets: 176 10*3/uL (ref 150.0–400.0)
RBC: 4.72 Mil/uL (ref 3.87–5.11)
RDW: 12.4 % (ref 11.5–15.5)
WBC: 5.9 10*3/uL (ref 4.0–10.5)

## 2022-12-19 LAB — TSH: TSH: 2.25 u[IU]/mL (ref 0.35–5.50)

## 2022-12-19 LAB — LDL CHOLESTEROL, DIRECT: Direct LDL: 132 mg/dL

## 2022-12-19 MED ORDER — MELOXICAM 15 MG PO TABS: 15.0000 mg | ORAL_TABLET | Freq: Every day | ORAL | 0 refills | Status: DC

## 2022-12-19 NOTE — Assessment & Plan Note (Signed)
Chronic problem.  Currently asymptomatic on compounded T3/T4.  Check labs.  Adjust meds prn

## 2022-12-19 NOTE — Progress Notes (Signed)
   Subjective:    Patient ID: Kristen [name], female    DOB: 20-Aug-1951, 72 y.o.   MRN: [id number]  HPI Hyperlipidemia- chronic problem, on Zetia '10mg'$  daily.  Denies CP, SOB, abd pain, N/V.  Hypothyroid- chronic problem, on compounded T3/T4 12.65mg/25mcg daily.  Pt reports feeling 'great'.  Denies fatigue, changes to skin/hair/nails  Overweight- pt has gained 4 lbs since last visit.  BMI now 27.35  Pt is working out 3-4x/week at fitness center and then walking on the other days.   Review of Systems For ROS see HPI     Objective:   Physical Exam Vitals reviewed.  Constitutional:      General: She is not in acute distress.    Appearance: Normal appearance. She is well-developed. She is not ill-appearing.  HENT:     Head: Normocephalic and atraumatic.  Eyes:     Conjunctiva/sclera: Conjunctivae normal.     Pupils: Pupils are equal, round, and reactive to light.  Neck:     Thyroid: No thyromegaly.  Cardiovascular:     Rate and Rhythm: Normal rate and regular rhythm.     Pulses: Normal pulses.     Heart sounds: Normal heart sounds. No murmur heard. Pulmonary:     Effort: Pulmonary effort is normal. No respiratory distress.     Breath sounds: Normal breath sounds.  Abdominal:     General: There is no distension.     Palpations: Abdomen is soft.     Tenderness: There is no abdominal tenderness.  Musculoskeletal:     Cervical back: Normal range of motion and neck supple.     Right lower leg: No edema.     Left lower leg: No edema.  Lymphadenopathy:     Cervical: No cervical adenopathy.  Skin:    General: Skin is warm and dry.  Neurological:     General: No focal deficit present.     Mental Status: She is alert and oriented to person, place, and time.  Psychiatric:        Mood and Affect: Mood normal.        Behavior: Behavior normal.        Thought Content: Thought content normal.           Assessment & Plan:

## 2022-12-19 NOTE — Patient Instructions (Signed)
Follow up in 6 months to recheck cholesterol We'll notify you of your lab results and make any changes if needed Continue to work on healthy diet and regular physical activity- you can do it! Call with any questions or concerns Stay Safe!  Stay Healthy!

## 2022-12-19 NOTE — Assessment & Plan Note (Signed)
Pt has gained 4 lbs since last visit but is exercising almost daily.  Applauded her efforts.  Will continue to follow.

## 2022-12-19 NOTE — Assessment & Plan Note (Signed)
Chronic problem.  Pt is statin intolerant but doing well on Zetia.  Check labs.  Adjust meds prn

## 2022-12-20 ENCOUNTER — Telehealth: Payer: Self-pay

## 2022-12-20 NOTE — Telephone Encounter (Signed)
Informed pt of lab results  

## 2022-12-20 NOTE — Telephone Encounter (Signed)
-----   Message from Midge Minium, MD sent at 12/20/2022  7:34 AM EST ----- Labs look great w/ exception of triglycerides (fatty part of blood) which have jumped considerably.  This will improve w/ healthy diet, regular exercise, and continued use of Zetia.  No med changes at this time.

## 2023-02-12 DIAGNOSIS — N200 Calculus of kidney: Secondary | ICD-10-CM | POA: Diagnosis not present

## 2023-02-12 DIAGNOSIS — N281 Cyst of kidney, acquired: Secondary | ICD-10-CM | POA: Diagnosis not present

## 2023-03-12 ENCOUNTER — Telehealth: Payer: Self-pay | Admitting: Family Medicine

## 2023-03-12 ENCOUNTER — Encounter: Payer: Self-pay | Admitting: Family Medicine

## 2023-03-12 ENCOUNTER — Ambulatory Visit (INDEPENDENT_AMBULATORY_CARE_PROVIDER_SITE_OTHER): Payer: Medicare Other | Admitting: Family Medicine

## 2023-03-12 VITALS — BP 122/72 | HR 80 | Temp 98.3°F | Resp 16 | Ht 63.0 in | Wt 154.1 lb

## 2023-03-12 DIAGNOSIS — R6889 Other general symptoms and signs: Secondary | ICD-10-CM | POA: Diagnosis not present

## 2023-03-12 DIAGNOSIS — Z1152 Encounter for screening for COVID-19: Secondary | ICD-10-CM

## 2023-03-12 DIAGNOSIS — R051 Acute cough: Secondary | ICD-10-CM | POA: Diagnosis not present

## 2023-03-12 LAB — POCT INFLUENZA A/B
Influenza A, POC: NEGATIVE
Influenza B, POC: NEGATIVE

## 2023-03-12 LAB — POC COVID19 BINAXNOW: SARS Coronavirus 2 Ag: NEGATIVE

## 2023-03-12 MED ORDER — AZITHROMYCIN 250 MG PO TABS: ORAL_TABLET | ORAL | 0 refills | Status: DC

## 2023-03-12 MED ORDER — GUAIFENESIN-CODEINE 100-10 MG/5ML PO SYRP: 10.0000 mL | ORAL_SOLUTION | Freq: Three times a day (TID) | ORAL | 0 refills | Status: DC | PRN

## 2023-03-12 NOTE — Progress Notes (Signed)
   Subjective:    Patient ID: DELLENE MCGROARTY, female    DOB: [birth year], 72 y.o.   MRN: [id number]  HPI Cough- sxs started 2 weeks ago.  Initially thought it was allergies or due to recent renovations.  Feels like sxs worsened this weekend.  + body aches last night, fatigue.  Denies facial pain/pressure but did have a rare headache.  Cough is now deeper and more chest congestion.  Now having ear fullness.  No known sick contacts.   Review of Systems For ROS see HPI     Objective:   Physical Exam Vitals reviewed.  Constitutional:      General: She is not in acute distress.    Appearance: Normal appearance. She is well-developed. She is not ill-appearing.  HENT:     Head: Normocephalic and atraumatic.     Right Ear: Tympanic membrane and ear canal normal.     Left Ear: Tympanic membrane and ear canal normal.     Nose: Congestion present. No rhinorrhea.     Mouth/Throat:     Mouth: Mucous membranes are moist.     Pharynx: No oropharyngeal exudate or posterior oropharyngeal erythema.  Eyes:     Conjunctiva/sclera: Conjunctivae normal.     Pupils: Pupils are equal, round, and reactive to light.  Cardiovascular:     Rate and Rhythm: Normal rate and regular rhythm.     Heart sounds: Normal heart sounds. No murmur heard. Pulmonary:     Effort: Pulmonary effort is normal. No respiratory distress.     Breath sounds: Normal breath sounds. No wheezing.     Comments: Dry, hacking cough Musculoskeletal:     Cervical back: Normal range of motion and neck supple.  Lymphadenopathy:     Cervical: No cervical adenopathy.  Skin:    General: Skin is warm and dry.  Neurological:     General: No focal deficit present.     Mental Status: She is alert and oriented to person, place, and time.  Psychiatric:        Mood and Affect: Mood normal.        Behavior: Behavior normal.        Thought Content: Thought content normal.           Assessment & Plan:  Acute cough- new.  Pt reports  she has had sxs x2 weeks but they worsened this weekend.  This is unusual in the course of a viral illness but she may have picked up flu or COVID in the interim.  Thankfully both tests were negative.  Given the worsening sxs rather than improvement, will start Zpack and cough syrup.  Encouraged her to add daily antihistamine.  Pt expressed understanding and is in agreement w/ plan.

## 2023-03-12 NOTE — Patient Instructions (Signed)
Follow up as needed or as scheduled Flu and COVID were both negative- YAY!! START the Azithromycin as directed TAKE a daily Claritin or Zyrtec to treat any allergy component USE your albuterol inhaler for coughing spells ADD the cough syrup as needed- may cause drowsiness Drink LOTS of fluids REST! Call with any questions or concerns Hang in there!!

## 2023-03-19 ENCOUNTER — Other Ambulatory Visit: Payer: Self-pay | Admitting: Family Medicine

## 2023-03-19 NOTE — Telephone Encounter (Signed)
Pharmacy is asking for a refill but then saying they can not calculate

## 2023-04-03 DIAGNOSIS — H04203 Unspecified epiphora, bilateral lacrimal glands: Secondary | ICD-10-CM | POA: Diagnosis not present

## 2023-04-03 DIAGNOSIS — H2513 Age-related nuclear cataract, bilateral: Secondary | ICD-10-CM | POA: Diagnosis not present

## 2023-04-03 DIAGNOSIS — H53143 Visual discomfort, bilateral: Secondary | ICD-10-CM | POA: Diagnosis not present

## 2023-05-14 ENCOUNTER — Other Ambulatory Visit: Payer: Self-pay | Admitting: Family Medicine

## 2023-06-18 ENCOUNTER — Ambulatory Visit (INDEPENDENT_AMBULATORY_CARE_PROVIDER_SITE_OTHER): Payer: Medicare Other | Admitting: Family Medicine

## 2023-06-18 ENCOUNTER — Telehealth: Payer: Self-pay

## 2023-06-18 ENCOUNTER — Encounter: Payer: Self-pay | Admitting: Family Medicine

## 2023-06-18 VITALS — BP 122/76 | HR 67 | Temp 97.9°F | Resp 18 | Ht 63.0 in | Wt 153.1 lb

## 2023-06-18 DIAGNOSIS — E785 Hyperlipidemia, unspecified: Secondary | ICD-10-CM | POA: Diagnosis not present

## 2023-06-18 LAB — BASIC METABOLIC PANEL
BUN: 19 mg/dL (ref 6–23)
CO2: 25 mEq/L (ref 19–32)
Calcium: 9.8 mg/dL (ref 8.4–10.5)
Chloride: 104 mEq/L (ref 96–112)
Creatinine, Ser: 0.75 mg/dL (ref 0.40–1.20)
GFR: 80.18 mL/min (ref >60.00)
Glucose, Bld: 84 mg/dL (ref 70–99)
Potassium: 3.4 mEq/L — ABNORMAL LOW (ref 3.5–5.1)
Sodium: 140 mEq/L (ref 135–145)

## 2023-06-18 LAB — HEPATIC FUNCTION PANEL
ALT: 14 U/L (ref 0–35)
AST: 17 U/L (ref 0–37)
Albumin: 4.5 g/dL (ref 3.5–5.2)
Alkaline Phosphatase: 68 U/L (ref 39–117)
Bilirubin, Direct: 0.2 mg/dL (ref 0.0–0.3)
Total Bilirubin: 1.6 mg/dL — ABNORMAL HIGH (ref 0.2–1.2)
Total Protein: 6.9 g/dL (ref 6.0–8.3)

## 2023-06-18 LAB — CBC WITH DIFFERENTIAL/PLATELET
Basophils Absolute: 0.1 10*3/uL (ref 0.0–0.1)
Basophils Relative: 1 % (ref 0.0–3.0)
Eosinophils Absolute: 0.1 10*3/uL (ref 0.0–0.7)
Eosinophils Relative: 1.2 % (ref 0.0–5.0)
HCT: 44.5 % (ref 36.0–46.0)
Hemoglobin: 15.5 g/dL — ABNORMAL HIGH (ref 12.0–15.0)
Lymphocytes Relative: 23.2 % (ref 12.0–46.0)
Lymphs Abs: 1.4 10*3/uL (ref 0.7–4.0)
MCHC: 34.8 g/dL (ref 30.0–36.0)
MCV: 92.7 fl (ref 78.0–100.0)
Monocytes Absolute: 0.6 10*3/uL (ref 0.1–1.0)
Monocytes Relative: 9 % (ref 3.0–12.0)
Neutro Abs: 4.1 10*3/uL (ref 1.4–7.7)
Neutrophils Relative %: 65.6 % (ref 43.0–77.0)
Platelets: 179 10*3/uL (ref 150.0–400.0)
RBC: 4.81 Mil/uL (ref 3.87–5.11)
RDW: 12.5 % (ref 11.5–15.5)
WBC: 6.2 10*3/uL (ref 4.0–10.5)

## 2023-06-18 LAB — LIPID PANEL
Cholesterol: 199 mg/dL (ref 0–200)
HDL: 50.1 mg/dL (ref >39.00)
NonHDL: 148.41
Total CHOL/HDL Ratio: 4
Triglycerides: 232 mg/dL — ABNORMAL HIGH (ref 0.0–149.0)
VLDL: 46.4 mg/dL — ABNORMAL HIGH (ref 0.0–40.0)

## 2023-06-18 LAB — LDL CHOLESTEROL, DIRECT: Direct LDL: 119 mg/dL

## 2023-06-18 NOTE — Assessment & Plan Note (Signed)
Chronic problem.  Currently tolerating Zetia w/o difficulty.  Applauded her efforts at healthy diet and regular exercise.  Check labs.  Adjust meds prn

## 2023-06-18 NOTE — Telephone Encounter (Signed)
Pt is aware of results. 

## 2023-06-18 NOTE — Progress Notes (Signed)
   Subjective:    Patient ID: Kristen [name], female    DOB: 1951/03/31, 72 y.o.   MRN: [id number]  HPI Hyperlipidemia- chronic problem, on Zetia 10mg  daily.  Weight is stable.  Pt reports feeling 'pretty good'.  Pt is exercising regularly.  No CP, SOB, abd pain, N/V.   Review of Systems For ROS see HPI     Objective:   Physical Exam Vitals reviewed.  Constitutional:      General: She is not in acute distress.    Appearance: Normal appearance. She is well-developed. She is not ill-appearing.  HENT:     Head: Normocephalic and atraumatic.  Eyes:     Conjunctiva/sclera: Conjunctivae normal.     Pupils: Pupils are equal, round, and reactive to light.  Neck:     Thyroid: No thyromegaly.  Cardiovascular:     Rate and Rhythm: Normal rate and regular rhythm.     Pulses: Normal pulses.     Heart sounds: Normal heart sounds. No murmur heard. Pulmonary:     Effort: Pulmonary effort is normal. No respiratory distress.     Breath sounds: Normal breath sounds.  Abdominal:     General: There is no distension.     Palpations: Abdomen is soft.     Tenderness: There is no abdominal tenderness.  Musculoskeletal:     Cervical back: Normal range of motion and neck supple.     Right lower leg: No edema.     Left lower leg: No edema.  Lymphadenopathy:     Cervical: No cervical adenopathy.  Skin:    General: Skin is warm and dry.  Neurological:     General: No focal deficit present.     Mental Status: She is alert and oriented to person, place, and time.  Psychiatric:        Mood and Affect: Mood normal.        Behavior: Behavior normal.        Thought Content: Thought content normal.           Assessment & Plan:

## 2023-06-18 NOTE — Telephone Encounter (Signed)
-----   Message from Neena Rhymes sent at 06/18/2023 12:33 PM EDT ----- Labs look great!  No changes at this time

## 2023-06-18 NOTE — Patient Instructions (Signed)
Follow up in 6 months to recheck cholesterol We'll notify you of your lab results and make any changes if needed Keep up the good work on healthy diet and regular exercise- you look great! Call with any questions or concerns Enjoy the rest of your summer!!!

## 2023-06-19 ENCOUNTER — Ambulatory Visit: Payer: Medicare Other | Admitting: Family Medicine

## 2023-08-07 ENCOUNTER — Other Ambulatory Visit: Payer: Self-pay | Admitting: Family Medicine

## 2023-08-30 ENCOUNTER — Ambulatory Visit (INDEPENDENT_AMBULATORY_CARE_PROVIDER_SITE_OTHER): Payer: Medicare Other

## 2023-08-30 DIAGNOSIS — Z23 Encounter for immunization: Secondary | ICD-10-CM | POA: Diagnosis not present

## 2023-08-30 NOTE — Progress Notes (Signed)
Pt was given HD Flu No concerns

## 2023-08-31 DIAGNOSIS — H903 Sensorineural hearing loss, bilateral: Secondary | ICD-10-CM | POA: Diagnosis not present

## 2023-09-06 ENCOUNTER — Other Ambulatory Visit: Payer: Self-pay | Admitting: Family Medicine

## 2023-10-09 DIAGNOSIS — R42 Dizziness and giddiness: Secondary | ICD-10-CM | POA: Diagnosis not present

## 2023-10-09 DIAGNOSIS — H8110 Benign paroxysmal vertigo, unspecified ear: Secondary | ICD-10-CM | POA: Insufficient documentation

## 2023-10-09 DIAGNOSIS — H9113 Presbycusis, bilateral: Secondary | ICD-10-CM | POA: Diagnosis not present

## 2023-11-08 NOTE — Telephone Encounter (Signed)
error 

## 2023-11-26 ENCOUNTER — Other Ambulatory Visit: Payer: Self-pay | Admitting: Family Medicine

## 2023-11-26 DIAGNOSIS — M222X2 Patellofemoral disorders, left knee: Secondary | ICD-10-CM | POA: Diagnosis not present

## 2023-11-26 NOTE — Telephone Encounter (Signed)
 Requested Prescriptions   Pending Prescriptions Disp Refills   meloxicam  (MOBIC ) 15 MG tablet [Pharmacy Med Name: MELOXICAM  15 MG TABLET] 90 tablet 0    Sig: TAKE 1 TABLET (15 MG TOTAL) BY MOUTH DAILY.   Signed Prescriptions Disp Refills   ezetimibe  (ZETIA ) 10 MG tablet 90 tablet 1    Sig: TAKE 1 TABLET BY MOUTH EVERY DAY FOR CHOLESTEROL    Authorizing Provider: TABORI, KATHERINE E    Ordering User: Timberlyn Pickford K     Date of patient request: 11/26/2023 Last office visit: 08/30/2023 Upcoming visit: 12/24/2023 Date of last refill: 09/06/2023 Last refill amount: 90

## 2023-12-10 DIAGNOSIS — Z1231 Encounter for screening mammogram for malignant neoplasm of breast: Secondary | ICD-10-CM | POA: Diagnosis not present

## 2023-12-10 LAB — HM MAMMOGRAPHY

## 2023-12-11 ENCOUNTER — Encounter: Payer: Self-pay | Admitting: Family Medicine

## 2023-12-24 ENCOUNTER — Ambulatory Visit: Payer: Medicare Other | Admitting: Family Medicine

## 2023-12-24 ENCOUNTER — Encounter: Payer: Self-pay | Admitting: Family Medicine

## 2023-12-24 VITALS — BP 118/68 | HR 61 | Temp 98.6°F | Ht 63.0 in | Wt 151.4 lb

## 2023-12-24 DIAGNOSIS — E038 Other specified hypothyroidism: Secondary | ICD-10-CM

## 2023-12-24 DIAGNOSIS — E785 Hyperlipidemia, unspecified: Secondary | ICD-10-CM | POA: Diagnosis not present

## 2023-12-24 DIAGNOSIS — J45909 Unspecified asthma, uncomplicated: Secondary | ICD-10-CM

## 2023-12-24 DIAGNOSIS — E663 Overweight: Secondary | ICD-10-CM | POA: Diagnosis not present

## 2023-12-24 LAB — HEPATIC FUNCTION PANEL
ALT: 19 U/L (ref 0–35)
AST: 18 U/L (ref 0–37)
Albumin: 4.1 g/dL (ref 3.5–5.2)
Alkaline Phosphatase: 68 U/L (ref 39–117)
Bilirubin, Direct: 0.2 mg/dL (ref 0.0–0.3)
Total Bilirubin: 1 mg/dL (ref 0.2–1.2)
Total Protein: 6.7 g/dL (ref 6.0–8.3)

## 2023-12-24 LAB — LIPID PANEL
Cholesterol: 187 mg/dL (ref 0–200)
HDL: 54.3 mg/dL (ref >39.00)
LDL Cholesterol: 101 mg/dL — ABNORMAL HIGH (ref 0–99)
NonHDL: 132.29
Total CHOL/HDL Ratio: 3
Triglycerides: 156 mg/dL — ABNORMAL HIGH (ref 0.0–149.0)
VLDL: 31.2 mg/dL (ref 0.0–40.0)

## 2023-12-24 LAB — BASIC METABOLIC PANEL
BUN: 19 mg/dL (ref 6–23)
CO2: 29 meq/L (ref 19–32)
Calcium: 9.3 mg/dL (ref 8.4–10.5)
Chloride: 103 meq/L (ref 96–112)
Creatinine, Ser: 0.69 mg/dL (ref 0.40–1.20)
GFR: 87.08 mL/min (ref >60.00)
Glucose, Bld: 84 mg/dL (ref 70–99)
Potassium: 3.8 meq/L (ref 3.5–5.1)
Sodium: 141 meq/L (ref 135–145)

## 2023-12-24 LAB — CBC WITH DIFFERENTIAL/PLATELET
Basophils Absolute: 0.1 10*3/uL (ref 0.0–0.1)
Basophils Relative: 0.9 % (ref 0.0–3.0)
Eosinophils Absolute: 0.1 10*3/uL (ref 0.0–0.7)
Eosinophils Relative: 0.9 % (ref 0.0–5.0)
HCT: 42.4 % (ref 36.0–46.0)
Hemoglobin: 14.8 g/dL (ref 12.0–15.0)
Lymphocytes Relative: 21.5 % (ref 12.0–46.0)
Lymphs Abs: 1.4 10*3/uL (ref 0.7–4.0)
MCHC: 35 g/dL (ref 30.0–36.0)
MCV: 93.1 fL (ref 78.0–100.0)
Monocytes Absolute: 0.6 10*3/uL (ref 0.1–1.0)
Monocytes Relative: 8.8 % (ref 3.0–12.0)
Neutro Abs: 4.5 10*3/uL (ref 1.4–7.7)
Neutrophils Relative %: 67.9 % (ref 43.0–77.0)
Platelets: 232 10*3/uL (ref 150.0–400.0)
RBC: 4.55 Mil/uL (ref 3.87–5.11)
RDW: 12.3 % (ref 11.5–15.5)
WBC: 6.6 10*3/uL (ref 4.0–10.5)

## 2023-12-24 LAB — TSH: TSH: 1.83 u[IU]/mL (ref 0.35–5.50)

## 2023-12-24 MED ORDER — ALBUTEROL SULFATE HFA 108 (90 BASE) MCG/ACT IN AERS
2.0000 | INHALATION_SPRAY | Freq: Four times a day (QID) | RESPIRATORY_TRACT | 2 refills | Status: AC | PRN
Start: 2023-12-24 — End: ?

## 2023-12-24 NOTE — Assessment & Plan Note (Signed)
 Weight is stable.  BMI 26.81  Was exercising regularly and had increased her weights but injured her L knee.  Plan is to get back in her usual routine once cleared by Ortho.  Encouraged her to increase reps and not weight.  Pt expressed understanding and is in agreement w/ plan.

## 2023-12-24 NOTE — Progress Notes (Signed)
   Subjective:    Patient ID: Kristen [name], female    DOB: September 17, 1951, 73 y.o.   MRN: [id number]  HPI Hyperlipidemia- chronic problem, on Zetia  10mg  daily.  No CP, SOB, HA's, abd pain, N/V.  Hypothyroid- chronic problem, on compounded T3/T4 12.76mcg/25mcg daily.  Pt reports good energy level.  No changes to skin/hair/nails.  Overweight- weight is stable.  BMI 26.81  Pt was exercising regularly until she injured her L knee.  Plans to get back to her usual routine.   Review of Systems For ROS see HPI     Objective:   Physical Exam Vitals reviewed.  Constitutional:      General: She is not in acute distress.    Appearance: Normal appearance. She is well-developed. She is not ill-appearing.  HENT:     Head: Normocephalic and atraumatic.  Eyes:     Conjunctiva/sclera: Conjunctivae normal.     Pupils: Pupils are equal, round, and reactive to light.  Neck:     Thyroid : No thyromegaly.  Cardiovascular:     Rate and Rhythm: Normal rate and regular rhythm.     Heart sounds: Normal heart sounds. No murmur heard. Pulmonary:     Effort: Pulmonary effort is normal. No respiratory distress.     Breath sounds: Normal breath sounds.  Abdominal:     General: There is no distension.     Palpations: Abdomen is soft.     Tenderness: There is no abdominal tenderness.  Musculoskeletal:     Cervical back: Normal range of motion and neck supple.     Right lower leg: No edema.     Left lower leg: No edema.  Lymphadenopathy:     Cervical: No cervical adenopathy.  Skin:    General: Skin is warm and dry.  Neurological:     General: No focal deficit present.     Mental Status: She is alert and oriented to person, place, and time.  Psychiatric:        Behavior: Behavior normal.           Assessment & Plan:

## 2023-12-24 NOTE — Assessment & Plan Note (Signed)
Chronic problem.  On Zetia 10mg daily w/o difficulty.  Check labs.  Adjust meds prn  

## 2023-12-24 NOTE — Assessment & Plan Note (Signed)
 Chronic problem.  Currently on compounded T3/T4.  Asymptomatic.  Check labs.  Adjust meds prn.

## 2023-12-24 NOTE — Patient Instructions (Signed)
 Follow up in 6 months to recheck cholesterol We'll notify you of your lab results and make any changes if needed Keep up the good work on healthy diet and regular exercise- you look great! Call with any questions or concerns Stay Safe!  Stay Healthy! Happy Valentine's Day!!!

## 2023-12-25 ENCOUNTER — Encounter: Payer: Self-pay | Admitting: Family Medicine

## 2024-01-01 ENCOUNTER — Other Ambulatory Visit: Payer: Self-pay

## 2024-01-01 ENCOUNTER — Telehealth: Payer: Self-pay

## 2024-01-01 MED ORDER — AMBULATORY NON FORMULARY MEDICATION: 3 refills | Status: AC

## 2024-01-01 NOTE — Telephone Encounter (Signed)
I do not see a compound medication in pt list, please be advised.

## 2024-01-01 NOTE — Telephone Encounter (Signed)
Called pt and clarified the prescription was faxed over and should be able to pick up medication.

## 2024-01-01 NOTE — Telephone Encounter (Signed)
Prescription was printed.  Please fax to [facility].  I am sorry for the delay but we did not receive a refill request

## 2024-01-01 NOTE — Addendum Note (Signed)
Addended by: Sheliah Hatch on: 01/01/2024 12:27 PM   Modules accepted: Orders

## 2024-01-01 NOTE — Telephone Encounter (Signed)
 Faxed prescription

## 2024-01-01 NOTE — Telephone Encounter (Signed)
Copied from CRM [id number]. Topic: Clinical - Prescription Issue >> Jan 01, 2024 10:04 AM Bo Mcclintock wrote: Reason for CRM: [facility] called in stating they are still waiting on an authorization for the pt's compound medication (T3/T4 capsule for thyroid) she states a request was faxed over on 02/13 and the pt is completely out of the medication

## 2024-02-25 ENCOUNTER — Other Ambulatory Visit: Payer: Self-pay | Admitting: Family Medicine

## 2024-02-26 NOTE — Telephone Encounter (Signed)
 Requested Prescriptions   Pending Prescriptions Disp Refills   meloxicam (MOBIC) 15 MG tablet [Pharmacy Med Name: MELOXICAM 15 MG TABLET] 90 tablet 0    Sig: TAKE 1 TABLET (15 MG TOTAL) BY MOUTH DAILY.     Date of patient request: 02/26/2024 Last office visit: 12/24/2023 Upcoming visit: 06/23/2024 Date of last refill: 11/26/2023 Last refill amount: 90

## 2024-03-25 DIAGNOSIS — N281 Cyst of kidney, acquired: Secondary | ICD-10-CM | POA: Diagnosis not present

## 2024-03-25 DIAGNOSIS — N200 Calculus of kidney: Secondary | ICD-10-CM | POA: Diagnosis not present

## 2024-04-21 ENCOUNTER — Encounter: Payer: Self-pay | Admitting: Family Medicine

## 2024-04-21 ENCOUNTER — Ambulatory Visit (INDEPENDENT_AMBULATORY_CARE_PROVIDER_SITE_OTHER): Admitting: Family Medicine

## 2024-04-21 VITALS — BP 108/68 | HR 84 | Temp 97.9°F | Ht 63.0 in | Wt 149.5 lb

## 2024-04-21 DIAGNOSIS — R051 Acute cough: Secondary | ICD-10-CM | POA: Diagnosis not present

## 2024-04-21 NOTE — Patient Instructions (Signed)
 Follow up as needed or as scheduled I suspect this was an allergy/viral combo Continue to use your Albuterol  inhaler as needed Robitussin or Delsym if cough continues Drink LOTS of fluids REST! Call with any questions or concerns Hang in there!!!

## 2024-04-21 NOTE — Progress Notes (Signed)
   Subjective:    Patient ID: BALEIGH RENNAKER, female    DOB: [birth year], 73 y.o.   MRN: [id number]  HPI Cough- 'i was sick last week'.  Pt reports she worked in garden on Monday.  Then developed subjective fever, night sweats, 'terrible cough' starting Tuesday.  Started feeling better on Saturday.  Continues to have mild cough.  Denies nasal congestion, sore throat, GI concerns.  No sinus pain/pressure.   Review of Systems For ROS see HPI     Objective:   Physical Exam Vitals reviewed.  Constitutional:      General: She is not in acute distress.    Appearance: Normal appearance. She is not ill-appearing.  HENT:     Head: Normocephalic and atraumatic.     Right Ear: Tympanic membrane and ear canal normal.     Left Ear: Tympanic membrane and ear canal normal.     Nose: No congestion.     Comments: No TTP over frontal or maxillary sinuses Eyes:     Extraocular Movements: Extraocular movements intact.     Conjunctiva/sclera: Conjunctivae normal.  Cardiovascular:     Rate and Rhythm: Normal rate and regular rhythm.     Pulses: Normal pulses.  Pulmonary:     Effort: No respiratory distress.     Breath sounds: Normal breath sounds. No wheezing or rhonchi.     Comments: + dry cough Musculoskeletal:     Cervical back: Neck supple.  Lymphadenopathy:     Cervical: No cervical adenopathy.  Skin:    General: Skin is warm and dry.  Neurological:     General: No focal deficit present.     Mental Status: She is alert and oriented to person, place, and time.  Psychiatric:        Mood and Affect: Mood normal.        Behavior: Behavior normal.        Thought Content: Thought content normal.           Assessment & Plan:  Cough- mostly resolved.  Sporadic dry cough during appt.  Lungs CTAB.  No wheezing.  Suspect allergy/viral combo.  Reviewed supportive care and red flags that should prompt return.  Pt expressed understanding and is in agreement w/ plan.

## 2024-05-23 ENCOUNTER — Other Ambulatory Visit: Payer: Self-pay | Admitting: Student in an Organized Health Care Education/Training Program

## 2024-05-23 ENCOUNTER — Other Ambulatory Visit: Payer: Self-pay | Admitting: Family Medicine

## 2024-05-23 NOTE — Telephone Encounter (Signed)
 Requested Prescriptions   Pending Prescriptions Disp Refills   meloxicam  (MOBIC ) 15 MG tablet [Pharmacy Med Name: MELOXICAM  15 MG TABLET] 90 tablet 0    Sig: TAKE 1 TABLET (15 MG TOTAL) BY MOUTH DAILY.     Date of patient request: 05/23/2024 Last office visit: 04/21/2024 Upcoming visit: 06/30/2024 Date of last refill: 02/26/2024 Last refill amount: 90

## 2024-05-26 DIAGNOSIS — L814 Other melanin hyperpigmentation: Secondary | ICD-10-CM | POA: Diagnosis not present

## 2024-05-26 DIAGNOSIS — D2262 Melanocytic nevi of left upper limb, including shoulder: Secondary | ICD-10-CM | POA: Diagnosis not present

## 2024-05-26 DIAGNOSIS — D225 Melanocytic nevi of trunk: Secondary | ICD-10-CM | POA: Diagnosis not present

## 2024-05-26 DIAGNOSIS — L821 Other seborrheic keratosis: Secondary | ICD-10-CM | POA: Diagnosis not present

## 2024-05-26 DIAGNOSIS — L570 Actinic keratosis: Secondary | ICD-10-CM | POA: Diagnosis not present

## 2024-05-29 ENCOUNTER — Encounter: Payer: Self-pay | Admitting: Family Medicine

## 2024-06-23 ENCOUNTER — Ambulatory Visit: Payer: Medicare Other | Admitting: Family Medicine

## 2024-06-30 ENCOUNTER — Ambulatory Visit (INDEPENDENT_AMBULATORY_CARE_PROVIDER_SITE_OTHER): Admitting: Family Medicine

## 2024-06-30 ENCOUNTER — Encounter: Payer: Self-pay | Admitting: Family Medicine

## 2024-06-30 VITALS — BP 124/68 | HR 79 | Temp 98.3°F | Ht 63.0 in | Wt 148.5 lb

## 2024-06-30 DIAGNOSIS — E663 Overweight: Secondary | ICD-10-CM

## 2024-06-30 DIAGNOSIS — Z1159 Encounter for screening for other viral diseases: Secondary | ICD-10-CM | POA: Diagnosis not present

## 2024-06-30 DIAGNOSIS — E785 Hyperlipidemia, unspecified: Secondary | ICD-10-CM

## 2024-06-30 LAB — BASIC METABOLIC PANEL WITH GFR
BUN: 18 mg/dL (ref 6–23)
CO2: 28 meq/L (ref 19–32)
Calcium: 9.5 mg/dL (ref 8.4–10.5)
Chloride: 102 meq/L (ref 96–112)
Creatinine, Ser: 0.75 mg/dL (ref 0.40–1.20)
GFR: 79.59 mL/min (ref >60.00)
Glucose, Bld: 91 mg/dL (ref 70–99)
Potassium: 3.4 meq/L — ABNORMAL LOW (ref 3.5–5.1)
Sodium: 141 meq/L (ref 135–145)

## 2024-06-30 LAB — HEPATIC FUNCTION PANEL
ALT: 17 U/L (ref 0–35)
AST: 21 U/L (ref 0–37)
Albumin: 4.4 g/dL (ref 3.5–5.2)
Alkaline Phosphatase: 64 U/L (ref 39–117)
Bilirubin, Direct: 0.2 mg/dL (ref 0.0–0.3)
Total Bilirubin: 1.8 mg/dL — ABNORMAL HIGH (ref 0.2–1.2)
Total Protein: 7 g/dL (ref 6.0–8.3)

## 2024-06-30 LAB — LIPID PANEL
Cholesterol: 204 mg/dL — ABNORMAL HIGH (ref 0–200)
HDL: 55.7 mg/dL (ref >39.00)
LDL Cholesterol: 109 mg/dL — ABNORMAL HIGH (ref 0–99)
NonHDL: 147.84
Total CHOL/HDL Ratio: 4
Triglycerides: 193 mg/dL — ABNORMAL HIGH (ref 0.0–149.0)
VLDL: 38.6 mg/dL (ref 0.0–40.0)

## 2024-06-30 LAB — CBC WITH DIFFERENTIAL/PLATELET
Basophils Absolute: 0.1 K/uL (ref 0.0–0.1)
Basophils Relative: 0.5 % (ref 0.0–3.0)
Eosinophils Absolute: 0.1 K/uL (ref 0.0–0.7)
Eosinophils Relative: 0.5 % (ref 0.0–5.0)
HCT: 45.7 % (ref 36.0–46.0)
Hemoglobin: 15.7 g/dL — ABNORMAL HIGH (ref 12.0–15.0)
Lymphocytes Relative: 7.6 % — ABNORMAL LOW (ref 12.0–46.0)
Lymphs Abs: 1.2 K/uL (ref 0.7–4.0)
MCHC: 34.4 g/dL (ref 30.0–36.0)
MCV: 91.6 fl (ref 78.0–100.0)
Monocytes Absolute: 1.2 K/uL — ABNORMAL HIGH (ref 0.1–1.0)
Monocytes Relative: 7.4 % (ref 3.0–12.0)
Neutro Abs: 13.6 K/uL — ABNORMAL HIGH (ref 1.4–7.7)
Neutrophils Relative %: 84 % — ABNORMAL HIGH (ref 43.0–77.0)
Platelets: 204 K/uL (ref 150.0–400.0)
RBC: 4.99 Mil/uL (ref 3.87–5.11)
RDW: 12.8 % (ref 11.5–15.5)
WBC: 16.2 K/uL — ABNORMAL HIGH (ref 4.0–10.5)

## 2024-06-30 LAB — TSH: TSH: 2.07 u[IU]/mL (ref 0.35–5.50)

## 2024-06-30 NOTE — Assessment & Plan Note (Signed)
 Ongoing issue.  Weight and BMI are stable.  Applauded efforts at diet and exercise.  Check labs to risk stratify.

## 2024-06-30 NOTE — Assessment & Plan Note (Signed)
Chronic problem, on Zetia 10mg  daily due to statin intolerance.  Check labs.  Adjust meds prn

## 2024-06-30 NOTE — Patient Instructions (Signed)
 Follow up in 6 months to recheck cholesterol We'll notify you of your lab results and make any changes if needed Keep up the good work on healthy diet and regular exercise- you look great! Call with any questions or concerns Stay Safe!  Stay Healthy! Enjoy the rest of your summer!!!

## 2024-06-30 NOTE — Progress Notes (Signed)
   Subjective:    Patient ID: Kristen [name], female    DOB: [birth year], 73 y.o.   MRN: [id number]  HPI Hyperlipidemia- chronic problem, on Zetia  10mg  daily.  No CP, SOB, abd pain, N/V.  Overweight- pt's weight and BMI are stable at 149 and 26.31.  Riding recumbent bike, golfing regularly, and gardening.   Review of Systems For ROS see HPI     Objective:   Physical Exam Vitals reviewed.  Constitutional:      General: She is not in acute distress.    Appearance: Normal appearance. She is well-developed. She is not ill-appearing.  HENT:     Head: Normocephalic and atraumatic.  Eyes:     Conjunctiva/sclera: Conjunctivae normal.     Pupils: Pupils are equal, round, and reactive to light.  Neck:     Thyroid : No thyromegaly.  Cardiovascular:     Rate and Rhythm: Normal rate and regular rhythm.     Pulses: Normal pulses.     Heart sounds: Normal heart sounds. No murmur heard. Pulmonary:     Effort: Pulmonary effort is normal. No respiratory distress.     Breath sounds: Normal breath sounds.  Abdominal:     General: There is no distension.     Palpations: Abdomen is soft.     Tenderness: There is no abdominal tenderness.  Musculoskeletal:     Cervical back: Normal range of motion and neck supple.     Right lower leg: No edema.     Left lower leg: No edema.  Lymphadenopathy:     Cervical: No cervical adenopathy.  Skin:    General: Skin is warm and dry.  Neurological:     General: No focal deficit present.     Mental Status: She is alert and oriented to person, place, and time.  Psychiatric:        Mood and Affect: Mood normal.        Behavior: Behavior normal.        Thought Content: Thought content normal.           Assessment & Plan:

## 2024-07-01 ENCOUNTER — Ambulatory Visit: Payer: Self-pay | Admitting: Family Medicine

## 2024-07-01 DIAGNOSIS — D72829 Elevated white blood cell count, unspecified: Secondary | ICD-10-CM

## 2024-07-01 LAB — HEPATITIS C ANTIBODY: Hepatitis C Ab: NONREACTIVE

## 2024-07-01 NOTE — Telephone Encounter (Signed)
 Called patient and went over labs  scheduled lab visit for Friday 07/04/24 afternoon  Ordered lab and placed as future.

## 2024-07-01 NOTE — Telephone Encounter (Signed)
-----   Message from Comer Greet sent at 07/01/2024  7:32 AM EDT ----- Your white blood cell count (WBC) is elevated.  Often this occurs if you are fighting infection, dealing w/ extensive inflammation, or are taking Prednisone or other prescribed steroids.  Does any of  this apply?  We are going to recheck your WBC later this week at a lab only visit to ensure this is coming back down (CBC w/ diff, dx leukocytosis).  There is also the possibility that this is a lab  error.  The remainder of your labs are stable and look great! ----- Message ----- From: Interface, Lab [facility] Sent: 06/30/2024   1:02 PM EDT To: Comer FORBES Greet, MD

## 2024-07-04 ENCOUNTER — Other Ambulatory Visit: Payer: Self-pay | Admitting: Family Medicine

## 2024-07-04 ENCOUNTER — Ambulatory Visit: Payer: Self-pay | Admitting: Family Medicine

## 2024-07-04 ENCOUNTER — Other Ambulatory Visit (INDEPENDENT_AMBULATORY_CARE_PROVIDER_SITE_OTHER): Payer: Self-pay

## 2024-07-04 ENCOUNTER — Other Ambulatory Visit

## 2024-07-04 DIAGNOSIS — D72829 Elevated white blood cell count, unspecified: Secondary | ICD-10-CM | POA: Diagnosis not present

## 2024-07-04 DIAGNOSIS — R350 Frequency of micturition: Secondary | ICD-10-CM | POA: Diagnosis not present

## 2024-07-04 LAB — POC URINALSYSI DIPSTICK (AUTOMATED)
Bilirubin, UA: NEGATIVE
Blood, UA: NEGATIVE
Glucose, UA: NEGATIVE
Ketones, UA: NEGATIVE
Leukocytes, UA: NEGATIVE
Nitrite, UA: NEGATIVE
Protein, UA: NEGATIVE
Spec Grav, UA: <=1.005 — AB (ref 1.010–1.025)
Urobilinogen, UA: 0.2 U/dL
pH, UA: 6 (ref 5.0–8.0)

## 2024-07-04 NOTE — Progress Notes (Signed)
Resulting agency changed to [facility].

## 2024-07-04 NOTE — Progress Notes (Signed)
 Patient verbalized understanding of urine results.

## 2024-07-05 LAB — CBC WITH DIFFERENTIAL/PLATELET
Absolute Lymphocytes: 2310 {cells}/uL (ref 850–3900)
Absolute Monocytes: 932 {cells}/uL (ref 200–950)
Basophils Absolute: 84 {cells}/uL (ref 0–200)
Basophils Relative: 1 %
Eosinophils Absolute: 101 {cells}/uL (ref 15–500)
Eosinophils Relative: 1.2 %
HCT: 44.1 % (ref 35.0–45.0)
Hemoglobin: 15.2 g/dL (ref 11.7–15.5)
MCH: 32.4 pg (ref 27.0–33.0)
MCHC: 34.5 g/dL (ref 32.0–36.0)
MCV: 94 fL (ref 80.0–100.0)
MPV: 11.1 fL (ref 7.5–12.5)
Monocytes Relative: 11.1 %
Neutro Abs: 4973 {cells}/uL (ref 1500–7800)
Neutrophils Relative %: 59.2 %
Platelets: 225 Thousand/uL (ref 140–400)
RBC: 4.69 Million/uL (ref 3.80–5.10)
RDW: 12.6 % (ref 11.0–15.0)
Total Lymphocyte: 27.5 %
WBC: 8.4 Thousand/uL (ref 3.8–10.8)

## 2024-07-07 ENCOUNTER — Ambulatory Visit: Payer: Self-pay | Admitting: Family Medicine

## 2024-07-07 NOTE — Progress Notes (Signed)
 Pt has been notified.

## 2024-08-23 ENCOUNTER — Other Ambulatory Visit: Payer: Self-pay | Admitting: Family

## 2024-09-04 ENCOUNTER — Ambulatory Visit (INDEPENDENT_AMBULATORY_CARE_PROVIDER_SITE_OTHER)

## 2024-09-04 DIAGNOSIS — Z23 Encounter for immunization: Secondary | ICD-10-CM | POA: Diagnosis not present

## 2024-09-22 ENCOUNTER — Other Ambulatory Visit: Payer: Self-pay | Admitting: Family Medicine

## 2024-09-26 ENCOUNTER — Other Ambulatory Visit: Payer: Self-pay | Admitting: Family Medicine

## 2024-09-26 MED ORDER — EZETIMIBE 10 MG PO TABS: 10.0000 mg | ORAL_TABLET | Freq: Every day | ORAL | 1 refills | Status: AC

## 2024-09-26 MED ORDER — MELOXICAM 15 MG PO TABS: 15.0000 mg | ORAL_TABLET | Freq: Every day | ORAL | 0 refills | Status: AC

## 2024-09-26 NOTE — Telephone Encounter (Signed)
 Copied from CRM #[id number]. Topic: Clinical - Medication Refill >> Sep 26, 2024  9:07 AM Macario HERO wrote: Medication: meloxicam  (MOBIC ) 15 MG tablet [[id number]] ezetimibe  (ZETIA ) 10 MG tablet [[id number]]  Has the patient contacted their pharmacy? Yes (Agent: If no, request that the patient contact the pharmacy for the refill. If patient does not wish to contact the pharmacy document the reason why and proceed with request.) (Agent: If yes, when and what did the pharmacy advise?) Pharmacy said they will send a request  This is the patient's preferred pharmacy:  [id number] - [place], [place] - [address] [place] [address] Phone: [phone] Fax: [phone]  Is this the correct pharmacy for this prescription? Yes If no, delete pharmacy and type the correct one.   Has the prescription been filled recently? Yes  Is the patient out of the medication? Almost  Has the patient been seen for an appointment in the last year OR does the patient have an upcoming appointment? Yes  Can we respond through MyChart? Yes  Agent: Please be advised that Rx refills may take up to 3 business days. We ask that you follow-up with your pharmacy.

## 2024-10-29 DIAGNOSIS — E039 Hypothyroidism, unspecified: Secondary | ICD-10-CM | POA: Diagnosis not present

## 2024-10-29 DIAGNOSIS — H524 Presbyopia: Secondary | ICD-10-CM | POA: Diagnosis not present

## 2024-10-29 DIAGNOSIS — H53143 Visual discomfort, bilateral: Secondary | ICD-10-CM | POA: Diagnosis not present

## 2024-10-29 DIAGNOSIS — H04203 Unspecified epiphora, bilateral lacrimal glands: Secondary | ICD-10-CM | POA: Diagnosis not present

## 2024-10-29 DIAGNOSIS — E7800 Pure hypercholesterolemia, unspecified: Secondary | ICD-10-CM | POA: Diagnosis not present

## 2024-10-29 DIAGNOSIS — H52223 Regular astigmatism, bilateral: Secondary | ICD-10-CM | POA: Diagnosis not present

## 2024-10-29 DIAGNOSIS — H5203 Hypermetropia, bilateral: Secondary | ICD-10-CM | POA: Diagnosis not present

## 2024-10-29 DIAGNOSIS — H2513 Age-related nuclear cataract, bilateral: Secondary | ICD-10-CM | POA: Diagnosis not present

## 2024-12-17 ENCOUNTER — Telehealth: Payer: Self-pay | Admitting: Family Medicine

## 2024-12-17 NOTE — Telephone Encounter (Signed)
 Type of form received:  Pharmacy Fax  Additional comments:   Received by: Fax  Form should be Faxed/mailed to: (address/ fax #) [phone]  Is patient requesting call for pickup:  Form placed:  Provider bin  Attach charge sheet.  Provider will determine charge.  Individual made aware of 3-5 business day turn around Yes?

## 2024-12-18 NOTE — Telephone Encounter (Signed)
 Have not come across this document.

## 2024-12-29 ENCOUNTER — Ambulatory Visit: Admitting: Family Medicine

## 2024-12-30 ENCOUNTER — Ambulatory Visit: Admitting: Family Medicine
